# Patient Record
Sex: Male | Born: 1970
Health system: Southern US, Community
[De-identification: ages and names within clinical notes are randomized; demographics above are authoritative.]

## PROBLEM LIST (undated history)

## (undated) ENCOUNTER — Emergency Department (HOSPITAL_COMMUNITY): Admission: EM | Payer: Managed Care, Other (non HMO) | Source: Home / Self Care

## (undated) ENCOUNTER — Emergency Department (HOSPITAL_COMMUNITY): Admission: EM | Payer: Self-pay | Source: Home / Self Care

## (undated) DIAGNOSIS — E119 Type 2 diabetes mellitus without complications: Secondary | ICD-10-CM

## (undated) DIAGNOSIS — I10 Essential (primary) hypertension: Secondary | ICD-10-CM

## (undated) DIAGNOSIS — G4730 Sleep apnea, unspecified: Secondary | ICD-10-CM

## (undated) DIAGNOSIS — Z8709 Personal history of other diseases of the respiratory system: Secondary | ICD-10-CM

## (undated) DIAGNOSIS — D696 Thrombocytopenia, unspecified: Secondary | ICD-10-CM

## (undated) DIAGNOSIS — H9190 Unspecified hearing loss, unspecified ear: Secondary | ICD-10-CM

## (undated) DIAGNOSIS — K219 Gastro-esophageal reflux disease without esophagitis: Secondary | ICD-10-CM

## (undated) DIAGNOSIS — E785 Hyperlipidemia, unspecified: Secondary | ICD-10-CM

## (undated) DIAGNOSIS — E538 Deficiency of other specified B group vitamins: Secondary | ICD-10-CM

## (undated) DIAGNOSIS — N182 Chronic kidney disease, stage 2 (mild): Secondary | ICD-10-CM

## (undated) HISTORY — PX: HEMORRHOID SURGERY: SHX153

## (undated) HISTORY — PX: NASAL SEPTUM SURGERY: SHX37

## (undated) HISTORY — PX: INFUSION PUMP IMPLANTATION: SHX1824

## (undated) HISTORY — DX: Gastro-esophageal reflux disease without esophagitis: K21.9

## (undated) HISTORY — DX: Type 2 diabetes mellitus without complications: E11.9

---

## 2002-05-18 ENCOUNTER — Encounter: Payer: Self-pay | Admitting: Urology

## 2002-05-18 ENCOUNTER — Ambulatory Visit (HOSPITAL_COMMUNITY): Admission: RE | Admit: 2002-05-18 | Discharge: 2002-05-18 | Payer: Self-pay | Admitting: Urology

## 2002-09-05 ENCOUNTER — Encounter: Payer: Self-pay | Admitting: Family Medicine

## 2002-09-05 ENCOUNTER — Ambulatory Visit (HOSPITAL_COMMUNITY): Admission: RE | Admit: 2002-09-05 | Discharge: 2002-09-05 | Payer: Self-pay | Admitting: Family Medicine

## 2002-09-18 ENCOUNTER — Emergency Department (HOSPITAL_COMMUNITY): Admission: EM | Admit: 2002-09-18 | Discharge: 2002-09-18 | Payer: Self-pay | Admitting: Internal Medicine

## 2004-03-01 ENCOUNTER — Ambulatory Visit (HOSPITAL_COMMUNITY): Admission: RE | Admit: 2004-03-01 | Discharge: 2004-03-01 | Payer: Self-pay | Admitting: Family Medicine

## 2004-03-18 ENCOUNTER — Inpatient Hospital Stay (HOSPITAL_COMMUNITY): Admission: EM | Admit: 2004-03-18 | Discharge: 2004-03-20 | Payer: Self-pay | Admitting: Emergency Medicine

## 2004-03-18 ENCOUNTER — Ambulatory Visit: Payer: Self-pay | Admitting: Internal Medicine

## 2004-03-24 ENCOUNTER — Inpatient Hospital Stay (HOSPITAL_COMMUNITY): Admission: EM | Admit: 2004-03-24 | Discharge: 2004-03-27 | Payer: Self-pay | Admitting: Emergency Medicine

## 2004-03-24 ENCOUNTER — Ambulatory Visit: Payer: Self-pay | Admitting: Internal Medicine

## 2004-03-27 ENCOUNTER — Emergency Department (HOSPITAL_COMMUNITY): Admission: EM | Admit: 2004-03-27 | Discharge: 2004-03-27 | Payer: Self-pay | Admitting: Emergency Medicine

## 2004-03-30 ENCOUNTER — Inpatient Hospital Stay (HOSPITAL_COMMUNITY): Admission: EM | Admit: 2004-03-30 | Discharge: 2004-04-03 | Payer: Self-pay | Admitting: Emergency Medicine

## 2006-12-09 ENCOUNTER — Ambulatory Visit (HOSPITAL_COMMUNITY): Admission: RE | Admit: 2006-12-09 | Discharge: 2006-12-09 | Payer: Self-pay | Admitting: Family Medicine

## 2007-05-27 ENCOUNTER — Observation Stay (HOSPITAL_COMMUNITY): Admission: EM | Admit: 2007-05-27 | Discharge: 2007-05-28 | Payer: Self-pay | Admitting: Emergency Medicine

## 2007-07-20 ENCOUNTER — Ambulatory Visit (HOSPITAL_COMMUNITY): Admission: RE | Admit: 2007-07-20 | Discharge: 2007-07-20 | Payer: Self-pay | Admitting: Family Medicine

## 2008-06-21 ENCOUNTER — Emergency Department (HOSPITAL_COMMUNITY): Admission: EM | Admit: 2008-06-21 | Discharge: 2008-06-21 | Payer: Self-pay | Admitting: Emergency Medicine

## 2010-08-13 LAB — DIFFERENTIAL
Basophils Absolute: 0.1 10*3/uL (ref 0.0–0.1)
Basophils Relative: 1 % (ref 0–1)
Eosinophils Absolute: 0.2 10*3/uL (ref 0.0–0.7)
Eosinophils Relative: 2 % (ref 0–5)
Lymphocytes Relative: 24 % (ref 12–46)
Lymphs Abs: 2.1 10*3/uL (ref 0.7–4.0)
Monocytes Absolute: 0.6 10*3/uL (ref 0.1–1.0)
Monocytes Relative: 7 % (ref 3–12)
Neutro Abs: 6.1 10*3/uL (ref 1.7–7.7)
Neutrophils Relative %: 67 % (ref 43–77)

## 2010-08-13 LAB — CBC
HCT: 45.7 % (ref 39.0–52.0)
Hemoglobin: 15.6 g/dL (ref 13.0–17.0)
MCHC: 34.1 g/dL (ref 30.0–36.0)
MCV: 89.6 fL (ref 78.0–100.0)
Platelets: 159 10*3/uL (ref 150–400)
RBC: 5.11 MIL/uL (ref 4.22–5.81)
RDW: 12.8 % (ref 11.5–15.5)
WBC: 9.1 10*3/uL (ref 4.0–10.5)

## 2010-08-13 LAB — LIPASE, BLOOD: Lipase: 26 U/L (ref 11–59)

## 2010-08-13 LAB — URINALYSIS, ROUTINE W REFLEX MICROSCOPIC
Glucose, UA: NEGATIVE mg/dL
Hgb urine dipstick: NEGATIVE
Ketones, ur: NEGATIVE mg/dL
Nitrite: NEGATIVE
Protein, ur: NEGATIVE mg/dL
Specific Gravity, Urine: >1.03 — ABNORMAL HIGH (ref 1.005–1.030)
Urobilinogen, UA: 0.2 mg/dL (ref 0.0–1.0)
pH: 6 (ref 5.0–8.0)

## 2010-08-13 LAB — COMPREHENSIVE METABOLIC PANEL
ALT: 36 U/L (ref 0–53)
AST: 28 U/L (ref 0–37)
Albumin: 4.1 g/dL (ref 3.5–5.2)
Alkaline Phosphatase: 57 U/L (ref 39–117)
BUN: 17 mg/dL (ref 6–23)
CO2: 29 mEq/L (ref 19–32)
Calcium: 9.3 mg/dL (ref 8.4–10.5)
Chloride: 107 mEq/L (ref 96–112)
Creatinine, Ser: 1.24 mg/dL (ref 0.4–1.5)
GFR calc Af Amer: >60 mL/min (ref >60)
GFR calc non Af Amer: >60 mL/min (ref >60)
Glucose, Bld: 56 mg/dL — ABNORMAL LOW (ref 70–99)
Potassium: 4.2 mEq/L (ref 3.5–5.1)
Sodium: 142 mEq/L (ref 135–145)
Total Bilirubin: 0.4 mg/dL (ref 0.3–1.2)
Total Protein: 6.4 g/dL (ref 6.0–8.3)

## 2010-08-13 LAB — GLUCOSE, CAPILLARY
Glucose-Capillary: 62 mg/dL — ABNORMAL LOW (ref 70–99)
Glucose-Capillary: 84 mg/dL (ref 70–99)

## 2010-09-10 NOTE — H&P (Signed)
NAMESIRIUS, WOODFORD NO.:  [id number]   MEDICAL RECORD NO.:  [id number]          PATIENT TYPE:  EMS   LOCATION:  ED                            FACILITY:  [facility]   PHYSICIAN:  Osvaldo Shipper, MD     DATE OF BIRTH:  1970/05/21   DATE OF ADMISSION:  05/27/2007  DATE OF DISCHARGE:  LH                              HISTORY & PHYSICAL   PRIMARY CARE PHYSICIAN:  Dr. Nobie Putnam from [facility].   ADMITTING DIAGNOSES:  1. Viral gastroenteritis.  2. Insulin-dependent diabetes.  3. Dyslipidemia.   CHIEF COMPLAINT:  Nausea, vomiting, diarrhea since this morning.   HISTORY OF PRESENT ILLNESS:  The patient is a 40 year old Caucasian male  who has insulin-dependent diabetes who woke up around 4:30 this morning  with vomiting.  The emesis lasted up until about 7:30, 7:45, and has  stopped since then.  After the episodes of vomiting this morning he also  started having diarrhea.  He has been having watery yellowish stool,  greenish sometimes but no blood.  He has had numerous episodes since  this morning.  Denies any abdominal pain.  He states his mother was sick  with similar symptoms the last few days, and he had been exposed to her.  He denied taking any antibiotics recently.  Denied any other exposure in  the form of eating out in a restaurant or such activities in the past  few days.   MEDICATIONS AT HOME:  1. Actos 45 mg once a day.  2. Protonix 40 mg once a day.  3. Simvastatin 10 mg once a day.  4. Lisinopril 5 mg once a day.  5. Insulin NovoLog pump.   ALLERGIES:  CODEINE.   PAST MEDICAL HISTORY:  Insulin-dependent diabetes for four years.  He  has been on the pump for the last three years.  He also has  dyslipidemia, hypertension.  He has never had any other surgeries in the  past.   SOCIAL HISTORY:  He lives in [place] with his wife.  He works in a  [employer].  Smokes occasionally mostly on the weekends when he has  about a pack per  day.  He does not drink any alcohol or illicit drug  use.   FAMILY HISTORY:  Unremarkable as per the patient.   REVIEW OF SYSTEMS:  GENERAL:  Positive for weakness.  HEENT:  Unremarkable.  CARDIOVASCULAR:  Unremarkable.  RESPIRATORY:  Unremarkable.  GI:  As in HPI.  GU:  Unremarkable.  UROLOGICAL:  Unremarkable.  PSYCHIATRIC:  Unremarkable.  NEUROLOGICAL:  Unremarkable.  Other systems unremarkable.   PHYSICAL EXAMINATION:  VITAL SIGNS:  Temperature 100.1, blood pressure  102/47, heart rate 130's to 140's, sinus tachycardia, respiratory rate  20, saturation 93% on room air.  Orthostatics have been unable to be  done at this time because patient has a lot of symptoms when he sits up.  GENERAL:  Slightly overweight individual in no distress.  Appears to be  quite weak.  HEENT:  There is no pallor, no icterus, oral mucosa is dry, no  oral  lesions are noted.  NECK:  Soft and supple, no thyromegaly is appreciated.  LUNGS:  Clear to auscultation bilaterally.  CARDIOVASCULAR:  S1, S2, tachycardic, regular, no murmurs appreciated.  ABDOMEN:  Soft, nontender, nondistended.  Bowel sounds are present.  No  mass or organomegaly is appreciated.  EXTREMITIES:  Show no edema.  Peripheral pulses are palpable.  NEUROLOGIC:  The patient is alert, oriented x3.  No focal neurological  deficits are present.   LABORATORY DATA:  White count 16,300 with 91% neutrophils, hemoglobin  17.6, MCV 88, platelet count 166.  BMET shows BUN 29, creatinine 1.4,  glucose 76, bicarb 23.  Blood acetone was negative.  UA did not show any  evidence of infection.  He did have acute abdominal series with  prominent small bowel loops in mid abdomen, cannot exclude early small  bowel obstruction.   ASSESSMENT:  This is a 40 year old Caucasian male with past medical  history of diabetes insulin-dependent who presents with onset of nausea,  vomiting, and diarrhea since this morning.  Symptoms sound typical for  viral  gastroenteritis.  He does not have any risk factors for  Clostridium difficile.  No evidence at this point to suggest food  poisoning.  He has leukocytosis and his abdominal series showing  nonspecific findings.   PLAN:  1. Likely viral gastroenteritis.  We will treat this symptomatically      with Imodium and anti-emetics.  If his vomiting persists will      consider getting a CAT scan.  If not we will advance his diet      slowly.  He will get Tylenol for fever.  Since he does have      leukocytosis I will put him on Cipro which I think can be      discontinued if the patient improves.  2. Insulin-dependent diabetes.  I think it is okay at this time to let      him continue with his insulin pump.  As long as he is able to      tolerate p.o. I think this should be reasonable.  If he continues      to have emesis we will add some D5 to his IV fluid, and if despite      this his blood sugars are not regulated I think we will just switch      him over to IV insulin drip.  3. We will hold off on his lisinopril for now as he is orthostatic      clearly based on symptoms.  We will check his LFT's tomorrow.  We      will continue to monitor his blood sugars closely.  He is      tachycardic because of dehydration, and will continue to monitor      that as well.   Further management decisions will depend on patient's response to  treatment.      Osvaldo Shipper, MD  Electronically Signed    GK/MEDQ  D:  05/27/2007  T:  05/27/2007  Job:  [id number]   cc:   Patrica Duel, M.D.  Fax: [phone]

## 2010-09-10 NOTE — Discharge Summary (Signed)
[name], [name]                  ACCOUNT NO.:  [id number]   MEDICAL RECORD NO.:  [id number]          PATIENT TYPE:  OBV   LOCATION:  A217                          FACILITY:  [facility]   PHYSICIAN:  Osvaldo Shipper, MD     DATE OF BIRTH:  July 24, 1970   DATE OF ADMISSION:  05/27/2007  DATE OF DISCHARGE:  01/30/2009LH                               DISCHARGE SUMMARY   Please see the H&P dictated yesterday for details regarding the  patient's presenting illness.   DISCHARGE DIAGNOSES:  1. Acute viral gastroenteritis, improved.  2. Insulin-dependent diabetes, stable.  3. Hypertension  4. Dyslipidemia, stable.   BRIEF HOSPITAL COURSE:  Briefly, this is a 40 year old Caucasian male  who has insulin-dependent diabetes, who presented with nausea, vomiting,  diarrhea, for about 8-hour duration.  The patient was exposed to his  mother, who had similar symptoms in this week.  As the patient was seen  in the ED, he was found to be profoundly dehydrated and tachycardic at  130s, 140s, and his blood pressure was running a little bit low.  It was  clinically orthostatic, though we never could obtain blood pressures to  give objective data.  He had mild pre-renal azotemia, as well.  Surprisingly, he was not in DKA.  The patient was admitted to the  hospital.  He was continued on his insulin pump.  He was given a lot of  fluids, and he has improved.  His heart rate has come down to 90's.  His  symptoms have improved.  He has not had any nausea, vomiting, since  yesterday morning.  Continues to have diarrhea, but it seems to have  slowed down as well.  His last episode of diarrhea was 7:00 a.m. this  morning.  The patient is very keen on going home.  I think it may be  reasonable to let him go home if he tolerates his lunch and is able to  ambulate a little bit.  I have told him to take over-the-counter  Imodium, if he continues to have diarrhea, otherwise to follow up with  his PMD next week.   Once  the patient tolerates lunch, and if he ambulates with assistance, I  think he can go home today, as he is very keen on doing so.   DISCHARGE MEDICATIONS:  1. Imodium 2 mg as needed for diarrhea every 4 hours.  2. Actos 45 mg daily.  3. Protonix 40 mg daily.  4. Lisinopril 5 mg daily.  5. Zocor 10 mg q.p.m.  6. Insulin pump, NovoLog as before.   FOLLOWUP:  With Dr. Nobie Putnam early next week.   IMAGING STUDIES:  He had an acute abdominal series which was non-  specific.  There was very low suspicion for SBO, based on clinical  examination, so we did not proceed with a CAT scan.   The patient has been told that if his symptoms recur, he need to seek  attention immediately.  The patient will be discharged, because he is  very keen on going home and appears to be quite stable at  this time.   Total time of discharge 35 minutes.      Osvaldo Shipper, MD  Electronically Signed     GK/MEDQ  D:  05/28/2007  T:  05/28/2007  Job:  [id number]   cc:   Patrica Duel, M.D.  Fax: [phone]

## 2010-09-13 NOTE — Discharge Summary (Signed)
[name], [name]                  ACCOUNT NO.:  [id number]   MEDICAL RECORD NO.:  [id number]          PATIENT TYPE:  INP   LOCATION:  A227                          FACILITY:  [facility]   PHYSICIAN:  Patrica Duel, M.D.    DATE OF BIRTH:  06-Dec-1970   DATE OF ADMISSION:  03/18/2004  DATE OF DISCHARGE:  11/23/2005LH                                 DISCHARGE SUMMARY   DISCHARGE DIAGNOSES:  1.  New onset diabetes mellitus.  2.  Dysphagia, status post esophagogastroduodenoscopy with minor esophageal      stricture status post dilation.   For details regarding this admission, please refer to admitting notes.   Briefly, this 40 year old male with a history of gastroesophageal reflux  disease presented to the office with a several day history of nausea and  loose stools.  He also reported an approximate 10 pound weight loss, extreme  thirst as well as cotton mouth.  His Accu-Chek reading was high.  He was  sent to the emergency department for evaluation.   There he was found to have a sugar approximately of 800 with no  ketoacidosis.  His potassium was 3.7.  High volume saline drip and IV  insulin were instituted.  He was transferred to the floor for further care.   COURSE IN THE HOSPITAL:  The patient did well in the hospital with prompt  normalization of his blood sugar.  His insulin drip was discontinued after  approximately eight hours of therapy.  He was begun on Glucovance 2.5/250  and maintained on sliding scale.  His sugars have remained improved in the  200 to 300 range.  He has required minimal supplemental insulin and he has  undergone diabetic teaching.   The patient has chronic dysphagia, which is somewhat atypical.  GI was  consulted and an upper endoscopy was performed.  There was no explanation  for the pharyngeal component of his dysphagia, though he did have a small  esophageal stricture.  H. pylori serology has been obtained and is pending.   Currently the patient is  eating well.  Blood sugar was 250 this morning.  He  is stable for discharge at this time.   PLAN:  1.  Glucovance 2.5/500 b.i.d.  2.  Protonix 40 mg q.d.  3.  He will follow his blood sugars frequently and call if he has any      problems.  4.  He will be evaluated in the office in one week.     Mark  MC/MEDQ  D:  03/20/2004  T:  03/20/2004  Job:  [id number]

## 2010-09-13 NOTE — Op Note (Signed)
[name], [name]                  ACCOUNT NO.:  [id number]   MEDICAL RECORD NO.:  [id number]          PATIENT TYPE:  INP   LOCATION:  A227                          FACILITY:  [facility]   PHYSICIAN:  Lionel December, M.D.    DATE OF BIRTH:  Sep 09, 1970   DATE OF PROCEDURE:  03/19/2004  DATE OF DISCHARGE:                                 OPERATIVE REPORT   PROCEDURE:  Esophagogastroduodenoscopy with esophageal dilation.   INDICATIONS:  [name] is a 40 year old Caucasian male who was admitted with a  15-pound weight loss, worsening nausea and vomiting, which he has had  chronically, and he was found to be diabetic with a glucose of 782.  He also  has intolerance to a variety of foods.  The only foods that he can eat and  keep down are hot dogs, [place] fries, Fruit Loops, ice cream, and some other  sweets.  He says he usually does all right with bread but cannot eat buns  with hamburgers, etc.  He also has intermittent solid food dysphagia.  He is  undergoing diagnostic and therapeutic EGD.  The procedure and risks were  reviewed with the patient, and informed consent was obtained.   PREOP MEDICATIONS:  Cetacaine spray for pharyngeal topical anesthesia,  Demerol 50 mg IV, Versed 8 mg IV in divided dose.   FINDINGS:  Procedure performed in the endoscopy suite.  Patient's vital  signs and O2 sat were monitored during the procedure and remained stable.  Patient was placed in the left lateral decubitus position.  The Olympus  scope was passed through the oropharynx without any difficulty, into the  esophagus.   ESOPHAGUS:  Mucosa of the esophagus normal except distally there were three  linear plaques of whitish coating.  Endoscopic appearance was not typical of  Candida.  Brushing was taking for KOH prep on the way out.  There was  incomplete noncritical ring at the GE junction.  The GE junction was at 38  cm from the incisors.  There was a 2-3 cm sliding hiatal hernia.  The mucosa  of the cardia  part of the stomach just below the gastric mucosa below the Z  line was erythematous.   STOMACH:  It was empty and distended very well with insufflation.  Folds of  the proximal stomach were normal.  Examination of the mucosa revealed few  petechiae.  The gastric antrum with erythema and granularity.  No ulcer  crater was seen.  The pyloric channel was patent.  The angularis, fundus,  and cardia were examined by retroflexed of the endoscope and were normal.   DUODENUM:  The bulbar mucosa revealed few erosions, but there was no ulcer  crater.  The scope was passed to the second and third part of the duodenum.  Folds were not very prominent but did not appear to be necessarily  __________ but felt to be within normal limits.  Biopsy was taken from post  bulbar mucosa for routine histology.  The endoscope was withdrawn.   The esophagus was dilated by passing a 56 [place] Maloney dilator.  The  endoscope was passed again post ED, and there was mucosal disruption at the  GE junction.  Brushing was taken for KOH prep from this esophageal mucosa  with whitish plaque, and the endoscope was withdrawn.  The patient tolerated  the procedure well.   FINAL DIAGNOSIS:  1.  Incomplete ring at the gastroesophageal junction with small sliding      hiatal hernia, free of margins of reflux esophagitis, limited to      gastroesophageal junction.  Distal esophageal mucosa revealed patchy,      whitish plaques which were brushed for KOH prep.  The esophagus was      dilated by passing a 56 [place] Maloney dilator, disrupting the ring.  2.  Gastric duodenitis.  3.  Duodenal biopsy taken from post bulbar duodenum, looking for mucosal      disease to make sure we are not dealing with celiac disease of atypical      presentation, given that he is diabetic.   RECOMMENDATIONS:  1.  Antireflux measures.  Continue PPI.  2.  Helicobacter pylori serology will be checked.     [name]   NR/MEDQ  D:  03/19/2004  T:   03/19/2004  Job:  [id number]   cc:   Patrica Duel, M.D.  [address]  [place]  [place] [address]  Fax: [phone]

## 2010-09-13 NOTE — Discharge Summary (Signed)
NAMEKENNIE, [name]                  ACCOUNT NO.:  [id number]   MEDICAL RECORD NO.:  [id number]          PATIENT TYPE:  INP   LOCATION:  A227                          FACILITY:  [facility]   PHYSICIAN:  Madelin Rear. Sherwood Gambler, MD  DATE OF BIRTH:  1970/05/15   DATE OF ADMISSION:  DATE OF DISCHARGE:  11/30/2005LH                                 DISCHARGE SUMMARY   DISCHARGE DIAGNOSES:  1.  Diabetes mellitus type 2, out of control.  2.  Dehydration.  3.  Probable eating disorder secondary to functional illness.  4.  Urinary tract infection.   DISCHARGE MEDICATIONS:  1.  Actos 30 mg p.o. daily.  2.  Cipro 500 mg p.o. b.i.d. x7 days.   SUMMARY:  The patient presents with peculiar dietary habits that are by  description life-long, consisting of French-fries, potato chips, and peanut  butter.  This is pretty much sole dietary intake for years according to  family, is confirmed.  He was brought in because of vomiting with attempts  at eating and swallowing pills.  Question medication intolerance.  He was  initially placed on glipizide, but apparently did not tolerate that well,  and had increasing blood sugars prompting admission.  He was evaluated by GI  who concurs with a functional illness and not an organic pathology  contributing to the vomiting.  He is able to handle [place] fries and peanut  butter without any problem, but any other dietary advised foods are rejected  causing nausea.  No emesis occurred in the hospital.  He was discharged  after reasonable blood sugar control in the low 200's fasting was noted.  He  required initial sliding scale insulin coverage; however, Actos at low dose  controlled his sugars well enough to obviate the use of insulin.  He will be  increased to 30 mg of Actos daily, followed up in one week.  He will follow  his fasting sugars.  We had a long conversation with the family as well as  the patient about working on his diet.  We will also schedule a psychiatric  followup to determine if there is any assistance they can render to change  his eating pattern.     Lawr   LJF/MEDQ  D:  03/27/2004  T:  03/27/2004  Job:  [id number]

## 2010-09-13 NOTE — H&P (Signed)
[name], [name]                  ACCOUNT NO.:  [id number]   MEDICAL RECORD NO.:  [id number]          PATIENT TYPE:  INP   LOCATION:  A225                          FACILITY:  [facility]   PHYSICIAN:  Corrie Mckusick, M.D.  DATE OF BIRTH:  1970-09-08   DATE OF ADMISSION:  03/30/2004  DATE OF DISCHARGE:  LH                                HISTORY & PHYSICAL   CHIEF COMPLAINT:  Vomiting, abdominal pain, uncontrolled diabetes.   HISTORY OF PRESENT ILLNESS:  A 40 year old gentleman with a recent diagnosis  of noninsulin dependent diabetes who was recently admitted x2 for nausea,  vomiting and uncontrolled blood sugars.  Please see the two prior H&P's from  the last two weeks for all details as well as discharge summaries from those  times.   He has gone home and not been able to take his medications including his  Cipro which was given on the last visit for UTI, and has had uncontrolled  blood sugar as well as nausea and vomiting. Again, please see all of the  prior notes from the recent admissions, as well as the consultations from GI  for all details.   PAST MEDICAL HISTORY:  Diabetes.   SOCIAL HISTORY:  Cigarette smoker, no alcohol, no drug use.   FAMILY HISTORY:  Diabetes.   MEDICATIONS ON ADMISSION:  1.  Glucotrol XL 10 mg daily.  2.  Actos 15 mg daily.   ALLERGIES:  CODEINE.   PHYSICAL EXAMINATION:  GENERAL:  A pleasant gentleman when I saw him,  joking, in no acute distress, seeing when he could go home.  VITAL SIGNS:  Temperature 98.1, blood pressure 124/70, pulse 139,  respirations 20.  HEENT:  Nasopharynx clear with moist mucous membranes.  NECK:  Supple, no lymphadenopathy.  CHEST:  Clear to auscultation bilaterally.  CARDIOVASCULAR:  Regular rate and rhythm, normal S1 and S2, no S3, murmurs,  gallops or rubs.  ABDOMEN:  Soft, nontender, nondistended.  EXTREMITIES:  No edema.   LABORATORY DATA:  Capillary blood sugar was 393 in the emergency room.  Please see flow  sheet for details.   ASSESSMENT/PLAN:  A 40 year old for readmission due to uncontrolled  diabetes, nausea and vomiting.   PLAN:  1.  Admit for aggressive fluid hydration as seen in the orders.  2.  Start sliding scale insulin, hold on insulin drip for now.  3.  Start Lantus 10 units at night.  4.  Hold n.p.o. for now.  5.  Watch potassium as I suspect it will drop and go ahead and start      potassium supplementation.  6.  Cover with Cipro 400 IV b.i.d. as he had UTI prior to discharge on the      last admission.  7.  Consult GI.  8.  Consult endocrinology.  9.  We will continue to follow up closely and keep him monitored on 2-A      telemetry.     Clemetine Marker  D:  03/31/2004  T:  03/31/2004  Job:  [id number]

## 2010-09-13 NOTE — Consult Note (Signed)
NAMEWASHINGTON, [name]                  ACCOUNT NO.:  [id number]   MEDICAL RECORD NO.:  [id number]          PATIENT TYPE:  INP   LOCATION:  A227                          FACILITY:  [facility]   PHYSICIAN:  Lionel December, M.D.    DATE OF BIRTH:  24-Jul-1970   DATE OF CONSULTATION:  DATE OF DISCHARGE:                                   CONSULTATION   REQUESTING PHYSICIAN:  Dr. Patrica Duel.   REASON FOR CONSULTATION:  Nausea, vomiting _____________, gastroesophageal  reflux disease.   HISTORY OF PRESENT ILLNESS:  ___________ He presented to his primary care  physician yesterday with complaint of 15-pound weight loss, increased  vomiting, a few days of diarrhea.  He was found to have a markedly-elevated  blood glucose level in the office and was admitted to the hospital for  further management.  Upon admission, his glucose was 782.  His potassium is  3.7, sodium 133.  BUN 16, creatinine 1.7.  Total bilirubin 1.7.  Alkaline  phosphatase 84, AST 14, ALT 19, albumin 4.0.  Calcium 9.1.  WBC 13, albumin  300.  Acute abdominal film was negative.  His glucose levels have improved.  His glucose this morning was 268.  Given his atypical eating habits and  complaints of dysphagia and postprandial vomiting, we were asked to see the  patient.  According to the patient and his wife, he only eats hotdog without  the bun, [place] fries, Fruit Loops, ice-cream, mild and other sweets.  He  says he has been like this all of his life.  He says he has been to multiple  doctors, and one doctor told him that he needs to see a psychiatrist.  As  long as he is eating the problems listed above, he has no problems. If he  tries to eat a food out of the ordinary for him, he vomits almost  immediately.  If feels like it is getting caught in his upper esophagus.  He  denies any nausea.  Lately, he has been vomiting more frequently, but this  has been felt to be due to his poorly-controlled glucose.  He also gives a 7-  year  history of gastroesophageal reflux disease.  He takes Tums and Rolaids  p.r.n.  He has been on PPI therapy in the past.  Currently, he is having  regular bowel movements.  He denies any diarrhea.  He denies melena or  rectal bleeding or abdominal pain.  He has lost about 15 pounds the last few  weeks.   CURRENT MEDICATIONS:  1.  Tums.  2.  Rolaids p.r.n.  3.  He took Aleve recently for what he felt was the flu, but does not take      this on a regular basis.   ALLERGIES:  CODEINE causes nausea.   PAST MEDICAL HISTORY:  1.  Gastroesophageal reflux disease for 7-8 years.  2.  New onset diabetes mellitus.  3.  Congential left ear deficits.  4.  He reports having his esophagus looked at three years ago via x-ray.   PAST SURGICAL HISTORY:  Hemorrhoidectomy.  FAMILY HISTORY:  Father has hyperlipidemia and hypertension.  No family  history of chronic GI illnesses, colorectal cancer or diabetes mellitus.   SOCIAL HISTORY:  He is married and has no children.  He used to be a heavy  smoker, smoking 2-3 packs daily, but a year ago, he cut back.  Now he only  smokes 3-4 cigarettes on the weekends.  He consumed about a six-pack of beer  on the weekends.   REVIEW OF SYSTEMS:  See HPI for GI.  In general, CARDIOPULMONARY:  Denies  any chest pain or shortness of breath.  GU:  Denies any dysuria, hematuria.   PHYSICAL EXAMINATION:  VITAL SIGNS:  Temperature 97, pulse 97, respirations  20, blood pressure 120/70.  GENERAL:  A pleasant, somewhat irritable Caucasian male, in no acute  distress.  SKIN:  Warm and dry, no jaundice.  HEENT:  Pupils equal, round and reactive to light.  Conjunctivae are pink.  Sclerae are nonicteric.  Oropharyngeal mucosa are moist and pink, no  lesions, erythema or exudate.  No lymphadenopathy or thyromegaly.  CHEST:  Lungs are clear to auscultation.  CARDIAC:  Exam reveals regular rate and rhythm, normal S1 and S2.  No  murmurs, rubs or gallops.  ABDOMEN:   Positive bowel sounds, soft, nondistended.  A very small pea-sized  umbilical hernia, nontender.  No organomegaly or masses.  No rebound,  tenderness or guarding.  EXTREMITIES:  No edema.   LABORATORY DATA:  As mentioned in HPI.  In addition, hemoglobin is 68.3,  hematocrit 46.2, platelets 202,000.  Sodium 133, total bilirubin 1.7,  alkaline phosphatase 84, AST 14, ALT 19, albumin 4.0.  Calcium 9.1.   IMPRESSION:  The patient is a 40 year old gentleman who was admitted with  new onset diabetes mellitus.  He had atypical eating habits. He only eats  very specific food preferences, and if he tries to anything outside of this,  he vomits almost immediately.  His symptoms are very atypical for  periesophageal dysphagia.  He does have chronic gastroesophageal reflux  disease and is at increased risk for Barrett's esophagus and other  complications.  I doubt his chronic symptoms are secondary to gastroparesis.   RECOMMENDATIONS:  1.  We will discuss further with Dr. Lionel December.  Consider inpatient EGD.  2.  Advise chronic PPI therapy for gastroesophageal reflux symptoms.  3.  Recommend dietary consultation.   I would like to thank Dr. Patrica Duel for allowing [place] to take part in the  care of this patient.     Lesl   LL/MEDQ  D:  03/19/2004  T:  03/19/2004  Job:  [id number]

## 2010-09-13 NOTE — Consult Note (Signed)
[name], [name]                  ACCOUNT NO.:  [id number]   MEDICAL RECORD NO.:  [id number]          PATIENT TYPE:  INP   LOCATION:  A227                          FACILITY:  [facility]   PHYSICIAN:  Sherrie Mustache, M.D.    DATE OF BIRTH:  1971-04-21   DATE OF CONSULTATION:  03/26/2004  DATE OF DISCHARGE:                                   CONSULTATION   REFERRING PHYSICIAN:  Madelin Rear. Sherwood Gambler, MD   INDICATION FOR THE CONSULT:  To help diabetes.   The patient is a 40 year old male who has been diagnosed to have diabetes  mellitus type 2 about two weeks ago after having blood work at Dr.  Geanie Logan office.  The patient had to be admitted because of having a  glucose level of 800 but no acute acidosis.  He was in the hospital for two  days and was released home on an oral agent, Glucophage, for controlling of  his sugar.  The other mediation that apparently he was taking was Protonix.  The patient has told me that he was doing fine until three days, when he  started to have again nausea and vomiting.  The patient had to come back to  the ER and was found to have hyponatremia, hypokalemia, and the sugar value  was 304.  The patient is admitted for management and further workup.   PAST MEDICAL HISTORY:  History of esophageal stricture.   SOCIAL HISTORY:  Positive for smoking, negative for ethanol abuse, negative  for drug abuse.  The patient is married.   ALLERGIES:  CODEINE.   FAMILY HISTORY:  Grandmother with diabetes, father has hyperlipidemia, and  mother has hyperlipidemia.   PHYSICAL EXAMINATION:  GENERAL:  The patient is well-developed and well-  nourished, does not appear to be in acute distress.  He is not jaundiced.  VITAL SIGNS:  His temperature is 97.5, heart rate 80, respiratory rate 18,  blood pressure 100/60.  HEENT:  Pupils equal, round, and reactive to light, EOMI, anicteric.  Throat  negative and atraumatic.  Ears and nose are unremarkable.  NECK:  Supple, negative  JVD, negative thyromegaly, negative lymphadenopathy,  negative bruits.  CHEST:  Lungs are clear, no wheezing and no rhonchi.  CARDIAC:  RRR, no murmurs, no gallops.  ABDOMEN:  Soft, no tenderness, bowel sounds present.  EXTREMITIES:  Negative for peripheral edema, clubbing or cyanosis.  Reflexes  symmetric and normal.  Peripheral pulses are present.   ASSESSMENT AND PLAN:  The patient has a recently diagnosed diabetes;  however, apparently he cannot tolerate Glucophage.  Since he has been  readmitted to the hospital, he has been taking Actos 15 mg daily, and his  sugar is running around 190-220.  I would keep him on 1800 calorie ADA and  also I explained to him the kind of diet that he should have when he goes  home, which should be mainly proteins without fat and vegetables and low in  carbohydrate.  I explained to him also how important it is that he has  physical activity, especially walking after meals.  He was advised also to  eat more than three times but small amounts at a time.  I would increase his  Actos to 30 mg, if needed to 45 mg, but watch diet, to try to control his  sugar.  However, if his blood sugar values are persistently high, then I  would consider _________ or longstanding insulin in addition to Actos.  I  would check today his lipid panel and also his hemoglobin A1C.      JF/MEDQ  D:  03/26/2004  T:  03/26/2004  Job:  [id number]

## 2010-09-13 NOTE — Consult Note (Signed)
[name], [name]                  ACCOUNT NO.:  [id number]   MEDICAL RECORD NO.:  [id number]          PATIENT TYPE:  INP   LOCATION:  A225                          FACILITY:  [facility]   PHYSICIAN:  Ihor Austin. Pulliam, M.D.DATE OF BIRTH:  July 06, 1970   DATE OF CONSULTATION:  03/30/2004  DATE OF DISCHARGE:                                   CONSULTATION   REASON FOR CONSULTATION:  Recurrent vomiting and dehydration.   HISTORY OF PRESENT ILLNESS:  I was asked to see this 40 year old white male  in consultation by Dr. Assunta Found who had admitted him via the emergency  room today.   [name] has had multiple recent admissions for poorly controlled diabetes  which generally leads to marked dehydration and eventually nausea and  vomiting.  He says he was tolerating food until last night when he started  having recurrent problems with nausea and vomiting.  He has not been able to  keep down any p.o. and it was felt that admission to the hospital was  necessary in order to appropriately rehydrated with IV's and also give him  IV medications for current symptoms.   It should be noted that he did have an upper GI endoscopy with esophageal  dilatation on March 19, 2004, by Dr. Lionel December.  Dr. Karilyn Cota felt that  there was an incomplete ring of the gastroesophageal junction with a small  sliding hiatal hernia.  There was no significant esophagitis.  Some whitish  plaques were noted.  Duodenitis was also found and it was advised that he  should be on anti-reflux measures as well as oral proton pump inhibitor  therapy.  He was dilated with a 56 [place] Maloney dilator.   PAST MEDICAL HISTORY:  1.  Chronic diabetes mellitus.  2.  Remote hemorrhoidectomy.   SOCIAL HISTORY:  He admits to being a cigarette smoker, but does not abuse  tobacco or alcohol.   FAMILY HISTORY:  Father has hyperlipidemia.  His mother has hyperlipidemia  as well as hypertension.  There is no one in the family with  peptic ulcer  disease, biliary tract disease, or GI tract, or pancreatic or biliary  malignancy.   REVIEW OF SYSTEMS:  Really could not obtain because the patient was somewhat  sedated as well as dry mouth and dehydrated, really did not feel up to  talking.   PHYSICAL EXAMINATION:  GENERAL:  A somewhat sleepy white male who seems in  no acute distress.  HEENT:  Normocephalic, atraumatic.  PERRL.  EOMI.  Hypopharynx shows a very  dry tongue and dry mucous membranes in the mouth.  NECK:  No deformity.  No palpable mass or tender nodes.  HEART:  Tachy, regular rate and rhythm, no murmur.  The precordium is  silent.  LUNGS:  Overall clear with fairly good effort.  ABDOMEN:  Obese, but soft.  Nondistended.  Bowel sounds active diffusely.  No tenderness palpable.  GENITORECTAL:  Not examined.  EXTREMITIES:  No active joint disease.  No peripheral edema.  No clubbing.  NEUROLOGIC:  Cranial nerves are intact to  confrontation.  No evidence of  gross sensory or motor deficits.  I did not have him demonstrate his gait.   LABORATORY DATA:  CBC with differential white blood cell count 18,100, with  86% neutrophils and 9% lymphocytes.  Hemoglobin 15, hematocrit 44.6%.  CMP  remarkable for sodium 133, bicarbonate 7, glucose 456.  Hepatic function  profile was within normal limits.  Serum amylase normal at 48 (27 to 131).  Serum lipase normal at 28 (normal 22 to 51).   IMPRESSION:  1.  Recurrent nausea and vomiting with dehydration.  It is certainly      possible that he has underlying diabetic gastroparesis.  At the same      time, his marked dehydration and hyperglycemia could lead to a cycle of      worsening pain and emesis as well.  For now, I think it would be      reasonable to treat him supportedly as if he has impaired gastric      emptying.  2.  Recent endoscopy with dilatation of distal esophageal ring.  3.  Endoscopic diagnosis of duodenitis on recent endoscopy.  4.  Poorly  controlled diabetes mellitus.  Compliance with medications sounds      to be a very significant issue.   PLAN:  1.  I agree with current intravenous orders for vigorous hydration.  2.  We will begin intravenous Protonix 40 mg intravenously daily.  3.  For its promotility effect, erythromycin 100 mg intravenously q.6h.  May      also need to add intravenous Reglan.  4.  We will prescribed Phenergan on a p.r.n. basis for its anti-emetic      effect.  5.  Do not think repeat endoscopy is indicated.  Once symptoms cool down, he      may be a good candidate for a nuclear medicine gastric emptying study to      document baseline.  6.  Consideration for evaluation of his biliary tree if not recently done to      include ultrasound of the abdomen as well as a possible nuclear medicine      gallbladder emptying study.   Thank you for this consultation.      TJP/MEDQ  D:  03/30/2004  T:  03/30/2004  Job:  [id number]   cc:   Lionel December, M.D.  [id number]  [place]  [place] [address]  Fax: [phone]

## 2010-09-13 NOTE — H&P (Signed)
[name], [name]                  ACCOUNT NO.:  [id number]   MEDICAL RECORD NO.:  [id number]          PATIENT TYPE:  INP   LOCATION:  A227                          FACILITY:  [facility]   PHYSICIAN:  Madelin Rear. Sherwood Gambler, MD  DATE OF BIRTH:  12/31/1970   DATE OF ADMISSION:  03/24/2004  DATE OF DISCHARGE:  LH                                HISTORY & PHYSICAL   CHIEF COMPLAINT:  Vomiting and abdominal pain.   HISTORY OF PRESENT ILLNESS:  The patient was recently diagnosed with new-  onset non-insulin-dependent diabetes mellitus, although he did have a mild  acidosis.  He was admitted and started on a regimen of oral hypoglycemics.  He was discharged on March 20, 2004.  He did relatively okay for the  ensuing 4 days, however, developed this intractable vomiting, stating that  every time he ate something including the medication used to control his  diabetes, that he would throw up.  His sugars began to rise due to non-  absorption of medication and I was called for this problem.  He had no  fever, vigors or chills.  He was sent to the emergency department for  evaluation.  On arrival there, he was found to be without active vomiting or  acute abdomen; he was noted to be hyponatremic and hypokalemic.  He also had  a modest anion gap acidosis.  His bilirubin was elevated at 2.3, although it  has been 1.7 in the past with a mild elevation of his indirect at 1.1.  Amylase and lipase were normal.  Liver function tests revealed modest  hypoalbuminemia, but otherwise unrevealing.  No radiographs were obtained at  the time of his ER evaluation.   PAST MEDICAL HISTORY:  He was recently endoscoped and found to have minor  esophageal stricture which was dilated on his last admission.   SOCIAL HISTORY:  Positive cigarette smoking, no alcohol or other drug use.   FAMILY HISTORY:  He has a grandmother with diabetes, father with  hyperlipidemia, as well as his mother with hyperlipidemia and  hypertension.  One sister is alive and well with no noted health problems.   PHYSICAL EXAMINATION:  GENERAL:  He is awake, alert and cooperative.  HEAD AND NECK:  No JVD or adenopathy.  Neck is supple.  CHEST:  Clear.  CARDIAC EXAM:  Regular rhythm without murmur, gallop or rub.  ABDOMEN:  Abdomen is soft.  No organomegaly or masses.  No guarding or  rebound tenderness.  EXTREMITIES:  Extremities without clubbing, cyanosis or edema.  NEUROLOGIC:  Examination is nonfocal.   LABORATORY AND ACCESSORY CLINICAL DATA:  Lab studies revealed an arterial  blood gas with a pH of 7.307 and a PCO2 of 28.9.  His CBC revealed normal  H&H and white count with a left shift.  Platelet count was normal.  Electrolytes:  Sodium 131, potassium 3.2, bicarbonate 15, blood sugar 304,  BUN and creatinine 17 and 1.4.  Liver function tests are as outlined under  the history of present illness.   Radiographs were not obtained.   Urinalysis was  remarkable for positive ketones and a pH of 5.0 with  glucosuria greater than 1000 noted.  Of note, he had no proteinuria.   IMPRESSION:  1.  Hyperglycemia with mild acidosis.  He will be admitted for intravenous      hydration, correction of his electrolytes, insulin sliding scale.  2.  Abdominal pain with recurrent vomiting.  The patient may be suffering      from acalculous cholecystitis.  Gallbladder ultrasound as well as      nuclear medicine studies to assess this will be performed while in      house.  Another possibility is he has a biliary dyskinesia.  If all      studies are negative, we plan to attain this as possible cause of      recurrent vomiting without localizing signs.  3.  Smoking.  Advised to discontinue this habit.     Lawr   LJF/MEDQ  D:  03/25/2004  T:  03/25/2004  Job:  [id number]

## 2010-09-13 NOTE — Discharge Summary (Signed)
[name], [name].:  [id number]   MEDICAL RECORD Vazquez.:  [id number]          PATIENT TYPE:  INP   LOCATION:  A225                          FACILITY:  [facility]   PHYSICIAN:  Corrie Mckusick, M.D.  DATE OF BIRTH:  February 27, 1971   DATE OF ADMISSION:  03/30/2004  DATE OF DISCHARGE:  12/07/2005LH                                 DISCHARGE SUMMARY   HISTORY OF PRESENT ILLNESS/PAST MEDICAL HISTORY:  Please see admission  history and physical.   HOSPITAL COURSE:  A 40 year old gentleman with a recent diagnosis of  diabetes who was readmitted now for the third time with nausea, vomiting and  uncontrolled blood sugars.  Please see the prior H&P's and discharge  summaries for details. He had been really overall noncompliant with his  medications and that is what has caused his hyperglycemia once again.  He  only eats a life-long diet of [place] fries, potatoes chips and peanut  butter.  This has been a major issue.  GI was consulted once again, please  see their note for details.  He was added on erythromycin as a promotility  agent.  He has finished out his Cipro for a UTI.   Diet was advanced on a daily basis and he improved on a daily basis.  I also  started him on Lantus in the evening,  which we increased to eventually 30  units at night with better blood pressure controls.  He also was encouraged  to eat an ADA proper diet.  IV fluids were decreased a day after admission.   The following two days, he continued to improve from a diabetic standpoint,  blood sugars getting overall down into the 120s to 180s.  We decided to hold  his discharge on the 6th and give him one more day on the increased dose of  Lantus.  He again improved quite a bit.  By December 7, on the day of  discharge, he was ready for home.   DISCHARGE PHYSICAL EXAM:  T-Max 97.8, blood pressure 110/65, heart rate 80,  blood sugars 107, 189 and 350.  When I saw [name] this morning, he was  pleasant,  talkative, joking and in Vazquez acute distress.  Naso- and oro-pharynx  clear with moist mucous membranes.  Chest clear to auscultation bilaterally.  Cardiovascular:  Regular rate, Vazquez murmurs.  Abdomen soft, nontender.   DISCHARGE MEDICATIONS:  1.  Actos 30 mg p.o. q.a.m.  2.  Lantus 30 units subcu q.p.m.  3.  Protonix 40 mg p.o. q.a.m.  4.  Erythromycin 250 p.o. q.i.d. for 7 days as a promotility agent.   FOLLOWUP:  1.  With Dr. Nobie Putnam in one week.  2.  Follow up with GI in one to two weeks for further workup as an      outpatient.   DISCHARGE CONDITION:  Improved and stable.     [name]   JCG/MEDQ  D:  04/03/2004  T:  04/03/2004  Job:  [id number]

## 2010-09-13 NOTE — H&P (Signed)
[name], [name]                  ACCOUNT NO.:  [id number]   MEDICAL RECORD NO.:  [id number]          PATIENT TYPE:  INP   LOCATION:  A227                          FACILITY:  [facility]   PHYSICIAN:  [name], M.D.    DATE OF BIRTH:  09-02-1970   DATE OF ADMISSION:  03/18/2004  DATE OF DISCHARGE:  LH                                HISTORY & PHYSICAL   CHIEF COMPLAINT:  Dry mouth, weight loss, and weakness.   HISTORY OF PRESENT ILLNESS:  [name] with a history of  gastroesophageal reflux disease.  He is also status post nasal fracture in  the remote past.  He smokes approximately two packs per day and uses ethanol  on occasion only.   The patient presented to the office today with several-day history of nausea  and loose stools.  He also reported approximately 10-pound weight loss and  extreme thirst and cotton mouth.  An office evaluation revealed glucose in  his urine as well as ketones  A physical exam was compatible with mild  dehydration.  An Accu-Chek was obtained which read high.  He was sent to  the emergency room for further evaluation.   In the emergency department, he was found to have a blood sugar of  approximately 800 with no ketoacidosis present.  His potassium was 3.7.   The patient is admitted for new onset diabetes mellitus.  Currently he shows  no signs of acidosis and is nontoxic.   There is no history of headache, neurologic deficits, chest pain, shortness  of breath, melena, hematemesis, hematochezia, or genitourinary symptoms  except for polyuria.   CURRENT MEDICATIONS:  P.R.N. Aciphex.   ALLERGIES:  None.  CODEINE causes nausea.   PAST MEDICAL HISTORY:  As noted.   FAMILY HISTORY:  There is a grandmother with diabetes.  Father has  hyperlipidemia.  Mother also has hyperlipidemia and hypertension.  One  sister is alive and well.   REVIEW OF SYSTEMS:  Negative except as mentioned.   SOCIAL HISTORY:  As mentioned.   PHYSICAL  EXAMINATION:  GENERAL:  A very pleasant male who is in no acute  distress.  VITAL SIGNS:  Heart rate 72, blood pressure 118/80, respirations 20 and  unlabored.  No odor of ketones is present.  HEENT:  Normocephalic and atraumatic.  Pupils are equal.  Ears, nose, and  throat are benign except for marked drying of mucous membranes.  NECK:  Supple.  No bruits, thyromegaly, lymphadenopathy noted.  LUNGS:  Clear.  CARDIAC:  Heart sounds are normal.  No murmurs, rubs, or gallops.  ABDOMEN:  Nontender, nondistended.  Bowel sounds are intact.  EXTREMITIES:  No clubbing, cyanosis, or edema.  NEUROLOGIC:  Exam is nonfocal.   IMPRESSION:  New onset diabetes with marked hyperglycemia without evidence  of significant acid-based disturbance at this time.   PLAN:  1.  Admit for high-volume fluids.  2.  Insulin drip.  3.  Potassium supplementation.  4.  Will follow expectantly.     Mark   MC/MEDQ  D:  03/18/2004  T:  03/18/2004  Job:  [id number]

## 2011-01-16 LAB — URINALYSIS, ROUTINE W REFLEX MICROSCOPIC
Bilirubin Urine: NEGATIVE
Glucose, UA: NEGATIVE
Ketones, ur: 40 — AB
Leukocytes, UA: NEGATIVE
Nitrite: NEGATIVE
Protein, ur: NEGATIVE
Specific Gravity, Urine: 1.025
Urobilinogen, UA: 0.2
pH: 5

## 2011-01-16 LAB — COMPREHENSIVE METABOLIC PANEL
AST: 20
Albumin: 2.9 — ABNORMAL LOW
Alkaline Phosphatase: 39
Chloride: 111
GFR calc Af Amer: >60
Potassium: 3.6
Total Bilirubin: 0.9

## 2011-01-16 LAB — CBC
HCT: 50.8
HCT: 39.6
Hemoglobin: 17.6 — ABNORMAL HIGH
Hemoglobin: 13.7
MCHC: 34.7
MCHC: 34.7
MCV: 88.7
MCV: 89.6
Platelets: 166
Platelets: 121 — ABNORMAL LOW
RBC: 5.72
RBC: 4.42
RDW: 13.2
RDW: 13.6
WBC: 16.3 — ABNORMAL HIGH
WBC: 7.4

## 2011-01-16 LAB — BASIC METABOLIC PANEL
BUN: 22
BUN: 29 — ABNORMAL HIGH
CO2: 19
CO2: 23
Calcium: 7.6 — ABNORMAL LOW
Calcium: 9.5
Chloride: 109
Chloride: 113 — ABNORMAL HIGH
Creatinine, Ser: 1.38
Creatinine, Ser: 1.42
GFR calc Af Amer: >60
GFR calc Af Amer: >60
GFR calc non Af Amer: 56 — ABNORMAL LOW
GFR calc non Af Amer: 58 — ABNORMAL LOW
Glucose, Bld: 280 — ABNORMAL HIGH
Glucose, Bld: 76
Potassium: 3.9
Potassium: 4
Sodium: 137
Sodium: 140

## 2011-01-16 LAB — DIFFERENTIAL
Basophils Absolute: 0
Basophils Absolute: 0
Basophils Relative: 0
Basophils Relative: 0
Eosinophils Absolute: 0.1
Eosinophils Absolute: 0.1
Eosinophils Relative: 0
Eosinophils Relative: 1
Lymphocytes Relative: 4 — ABNORMAL LOW
Lymphs Abs: 0.7
Monocytes Absolute: 0.7
Monocytes Absolute: 0.6
Monocytes Relative: 4
Neutro Abs: 14.9 — ABNORMAL HIGH
Neutrophils Relative %: 91 — ABNORMAL HIGH

## 2011-01-16 LAB — KETONES, QUALITATIVE: Acetone, Bld: NEGATIVE

## 2011-01-16 LAB — URINE MICROSCOPIC-ADD ON: Urine-Other: NONE SEEN

## 2011-02-07 ENCOUNTER — Encounter: Payer: Self-pay | Admitting: *Deleted

## 2011-02-07 ENCOUNTER — Emergency Department (HOSPITAL_COMMUNITY)
Admission: EM | Admit: 2011-02-07 | Discharge: 2011-02-07 | Disposition: A | Discharge status: Home / Self Care | Payer: Managed Care, Other (non HMO) | Attending: Emergency Medicine | Admitting: Emergency Medicine

## 2011-02-07 DIAGNOSIS — R197 Diarrhea, unspecified: Secondary | ICD-10-CM

## 2011-02-07 DIAGNOSIS — Z794 Long term (current) use of insulin: Secondary | ICD-10-CM | POA: Insufficient documentation

## 2011-02-07 DIAGNOSIS — E1169 Type 2 diabetes mellitus with other specified complication: Secondary | ICD-10-CM | POA: Insufficient documentation

## 2011-02-07 DIAGNOSIS — E162 Hypoglycemia, unspecified: Secondary | ICD-10-CM

## 2011-02-07 LAB — CBC
HCT: 45.3 % (ref 39.0–52.0)
MCHC: 34 g/dL (ref 30.0–36.0)
MCV: 89.2 fL (ref 78.0–100.0)
RDW: 12.2 % (ref 11.5–15.5)
WBC: 8.6 10*3/uL (ref 4.0–10.5)

## 2011-02-07 LAB — BASIC METABOLIC PANEL
BUN: 18 mg/dL (ref 6–23)
CO2: 27 mEq/L (ref 19–32)
Chloride: 100 mEq/L (ref 96–112)
Creatinine, Ser: 1.13 mg/dL (ref 0.50–1.35)

## 2011-02-07 LAB — URINALYSIS, ROUTINE W REFLEX MICROSCOPIC
Bilirubin Urine: NEGATIVE
Hgb urine dipstick: NEGATIVE
Specific Gravity, Urine: 1.025 (ref 1.005–1.030)
Urobilinogen, UA: 0.2 mg/dL (ref 0.0–1.0)

## 2011-02-07 MED ORDER — SODIUM CHLORIDE 0.9 % IV SOLN: INTRAVENOUS | Status: DC

## 2011-02-07 MED ORDER — ONDANSETRON 8 MG PO TBDP: 8.0000 mg | ORAL_TABLET | Freq: Three times a day (TID) | ORAL | Status: AC | PRN

## 2011-02-07 MED ORDER — SODIUM CHLORIDE 0.9 % IV BOLUS (SEPSIS): 500.0000 mL | Freq: Once | INTRAVENOUS | Status: DC

## 2011-02-07 NOTE — ED Notes (Signed)
Expressing hunger---snack given and pt. Tolerated well.

## 2011-02-07 NOTE — ED Notes (Signed)
Sent from Dr Lamar Blinks office for evaluation, patient is a diabetic and is on an insulin pump

## 2011-02-07 NOTE — ED Notes (Signed)
Temp retaken 100.3  Pt. Reports going to bathroom few minutes ago and having diarrhea again.  Dr. notified

## 2011-02-07 NOTE — ED Notes (Signed)
Pt drinking coke at time of IV insertion. Pt alert and oriented with family at bedside

## 2011-02-07 NOTE — ED Provider Notes (Addendum)
Scribed for [employer]. Rubin Payor, MD, the patient was seen in room APA12/APA12. This chart was scribed by [employer]. The patient's care started at 15:11  CSN: [id number] Arrival date & time: 02/07/2011  1:48 PM  Chief Complaint  Patient presents with  . Diabetes   HPI [name] is a 40 y.o. male with a history of diabetes mellitus who presents to the Emergency Department complaining of diarrhea, onset 9pm yesterday. Patient reports recording his blood sugar at 50. He was sent from Dr Lamar Blinks office for evaluation. Patient is a diabetic and has an insulin pump. Reports sick contact with wife. Blood sugar is 102 at this time.  History reviewed. No pertinent past medical history.  History reviewed. No pertinent past surgical history.  No family history on file.  History  Substance Use Topics  . Smoking status: Not on file  . Smokeless tobacco: Not on file  . Alcohol Use: Yes      Review of Systems  Gastrointestinal: Positive for diarrhea.  All other systems reviewed and are negative.    Allergies  Codeine and Glucophage  Home Medications   Current Outpatient Rx  Name Route Sig Dispense Refill  . INSULIN PUMP Subcutaneous Inject into the skin daily. NOVOLOG: Consistantly     . LISINOPRIL 5 MG PO TABS Oral Take 5 mg by mouth every morning.      Marland Kitchen PANTOPRAZOLE SODIUM 40 MG PO TBEC Oral Take 40 mg by mouth every morning.      Marland Kitchen SIMVASTATIN 5 MG PO TABS Oral Take 5 mg by mouth every morning.        BP 101/57  Pulse 107  Temp(Src) 98.5 F (36.9 C) (Oral)  Resp 20  Ht 5\' 6"  (1.676 m)  Wt 188 lb (85.276 kg)  BMI 30.34 kg/m2  SpO2 97%  Physical Exam  Nursing note and vitals reviewed. Constitutional: He is oriented to person, place, and time. He appears well-developed and well-nourished. No distress.  HENT:  Head: Normocephalic and atraumatic.  Right Ear: Tympanic membrane is not erythematous and not bulging.  Left Ear: Tympanic membrane is not erythematous  and not bulging.  Mouth/Throat: Oropharynx is clear and moist. No oropharyngeal exudate, posterior oropharyngeal edema or posterior oropharyngeal erythema.       Opaque eardrums bilaterally  Eyes: Conjunctivae are normal. Right eye exhibits no discharge. Left eye exhibits no discharge.  Neck: No tracheal deviation present.  Cardiovascular: Normal rate.   Pulmonary/Chest: Effort normal.       Breaths harsh bilaterally  Abdominal: Soft. Bowel sounds are normal. There is no tenderness.       Insulin needle noted in the LLQ of abdomen. Mild erythema  Lymphadenopathy:    He has no cervical adenopathy.  Neurological: He is alert and oriented to person, place, and time. No cranial nerve deficit.  Skin: Skin is warm and dry. No rash noted. He is not diaphoretic.  Psychiatric: He has a normal mood and affect. His behavior is normal.    ED Course  Procedures   DIAGNOSTIC STUDIES: Oxygen Saturation is 97% on room air, normal by my interpretation.    COORDINATION OF CARE:  15:08 - EDP examined patient and ordered blood work and fluids. Patient advised to ensure nutrient and fluid intake to compensate for nutrient and fluid loss from diarrhea. Orders Placed This Encounter  Procedures  . Glucose, capillary  . Basic metabolic panel  . CBC  . Urinalysis with microscopic  . Encourage fluids  Labs and Radiology   Results for orders placed during the hospital encounter of 02/07/11  GLUCOSE, CAPILLARY      Component Value Range   Glucose-Capillary 64 (*) 70 - 99 (mg/dL)   Comment 1 Notify RN    BASIC METABOLIC PANEL      Component Value Range   Sodium 136  135 - 145 (mEq/L)   Potassium 3.6  3.5 - 5.1 (mEq/L)   Chloride 100  96 - 112 (mEq/L)   CO2 27  19 - 32 (mEq/L)   Glucose, Bld 92  70 - 99 (mg/dL)   BUN 18  6 - 23 (mg/dL)   Creatinine, Ser 1.61  0.50 - 1.35 (mg/dL)   Calcium 8.8  8.4 - 09.6 (mg/dL)   GFR calc non Af Amer 79 (*) >90 (mL/min)   GFR calc Af Amer >90  >90 (mL/min)    CBC      Component Value Range   WBC 8.6  4.0 - 10.5 (K/uL)   RBC 5.08  4.22 - 5.81 (MIL/uL)   Hemoglobin 15.4  13.0 - 17.0 (g/dL)   HCT 04.5  40.9 - 81.1 (%)   MCV 89.2  78.0 - 100.0 (fL)   MCH 30.3  26.0 - 34.0 (pg)   MCHC 34.0  30.0 - 36.0 (g/dL)   RDW 91.4  78.2 - 95.6 (%)   Platelets 147 (*) 150 - 400 (K/uL)  URINALYSIS, ROUTINE W REFLEX MICROSCOPIC      Component Value Range   Color, Urine YELLOW  YELLOW    Appearance CLEAR  CLEAR    Specific Gravity, Urine 1.025  1.005 - 1.030    pH 5.5  5.0 - 8.0    Glucose, UA NEGATIVE  NEGATIVE (mg/dL)   Hgb urine dipstick NEGATIVE  NEGATIVE    Bilirubin Urine NEGATIVE  NEGATIVE    Ketones, ur NEGATIVE  NEGATIVE (mg/dL)   Protein, ur NEGATIVE  NEGATIVE (mg/dL)   Urobilinogen, UA 0.2  0.0 - 1.0 (mg/dL)   Nitrite NEGATIVE  NEGATIVE    Leukocytes, UA NEGATIVE  NEGATIVE      Patient came into the ER for diarrhea and generalized weakness. His sugars have dropped down to the 60s, however it was improved with treatment Coke. He has tolerated orals here. His labs are reassuring. He feels much better wants to go home. He will be discharged with Zofran to ensure oral intake. He is to followup with Dr. Phillips Odor.    Scribe Attestation I personally performed the services described in this documentation, which was scribed in my presence. The recorded information has been reviewed and considered. Juliet Rude. Rubin Payor, MD  Juliet Rude. Rubin Payor, MD 02/07/11 1637  Juliet Rude. Rubin Payor, MD 02/07/11 1649

## 2011-02-07 NOTE — ED Notes (Signed)
In to see pt.--states he has an insulin pump and has had diarrhea for the past few days---knows his blood sugar is low usually but, for the last few days is unable to tell when it drops.  Pt. Is alert and oriented with clear concise speech--states he can drink Coke---Coke given to pt.

## 2011-02-08 ENCOUNTER — Encounter (HOSPITAL_COMMUNITY): Payer: Self-pay | Admitting: *Deleted

## 2011-02-08 ENCOUNTER — Emergency Department (HOSPITAL_COMMUNITY)
Admission: EM | Admit: 2011-02-08 | Discharge: 2011-02-08 | Disposition: A | Discharge status: Home / Self Care | Payer: Managed Care, Other (non HMO) | Attending: Emergency Medicine | Admitting: Emergency Medicine

## 2011-02-08 DIAGNOSIS — E869 Volume depletion, unspecified: Secondary | ICD-10-CM | POA: Insufficient documentation

## 2011-02-08 DIAGNOSIS — E119 Type 2 diabetes mellitus without complications: Secondary | ICD-10-CM | POA: Insufficient documentation

## 2011-02-08 DIAGNOSIS — R000 Tachycardia, unspecified: Secondary | ICD-10-CM | POA: Insufficient documentation

## 2011-02-08 DIAGNOSIS — R197 Diarrhea, unspecified: Secondary | ICD-10-CM | POA: Insufficient documentation

## 2011-02-08 DIAGNOSIS — R112 Nausea with vomiting, unspecified: Secondary | ICD-10-CM | POA: Insufficient documentation

## 2011-02-08 DIAGNOSIS — Z794 Long term (current) use of insulin: Secondary | ICD-10-CM | POA: Insufficient documentation

## 2011-02-08 DIAGNOSIS — F17200 Nicotine dependence, unspecified, uncomplicated: Secondary | ICD-10-CM | POA: Insufficient documentation

## 2011-02-08 LAB — BASIC METABOLIC PANEL
BUN: 17 mg/dL (ref 6–23)
CO2: 25 mEq/L (ref 19–32)
Calcium: 8.9 mg/dL (ref 8.4–10.5)
Creatinine, Ser: 1.12 mg/dL (ref 0.50–1.35)
Glucose, Bld: 86 mg/dL (ref 70–99)

## 2011-02-08 LAB — CBC
HCT: 46.6 % (ref 39.0–52.0)
Hemoglobin: 16.2 g/dL (ref 13.0–17.0)
MCH: 30.9 pg (ref 26.0–34.0)
MCV: 88.9 fL (ref 78.0–100.0)
Platelets: 136 10*3/uL — ABNORMAL LOW (ref 150–400)
RBC: 5.24 MIL/uL (ref 4.22–5.81)

## 2011-02-08 MED ORDER — ONDANSETRON HCL 4 MG/2ML IJ SOLN
4.0000 mg | Freq: Once | INTRAMUSCULAR | Status: AC
  Administered 2011-02-08: 4 mg via INTRAVENOUS
  Filled 2011-02-08: qty 2

## 2011-02-08 MED ORDER — ONDANSETRON HCL 4 MG PO TABS: 8.0000 mg | ORAL_TABLET | Freq: Three times a day (TID) | ORAL | Status: AC | PRN

## 2011-02-08 MED ORDER — LORAZEPAM 2 MG/ML IJ SOLN
0.5000 mg | Freq: Once | INTRAMUSCULAR | Status: AC
  Administered 2011-02-08: 03:00:00 via INTRAVENOUS
  Filled 2011-02-08: qty 1

## 2011-02-08 MED ORDER — SODIUM CHLORIDE 0.9 % IV BOLUS (SEPSIS)
1000.0000 mL | Freq: Once | INTRAVENOUS | Status: AC
  Administered 2011-02-08: 1000 mL via INTRAVENOUS

## 2011-02-08 NOTE — ED Notes (Signed)
Pt c/o fever, chills, n/v/d. Pt was seen here earlier today. Pts blood sugar 90.

## 2011-02-08 NOTE — ED Notes (Signed)
Says he is feeling better.

## 2011-02-08 NOTE — ED Provider Notes (Signed)
History     CSN: [id number] Arrival date & time: 02/08/2011 12:55 AM  Chief Complaint  Patient presents with  . Fever  . Chills  . Nausea  . Emesis  . Diarrhea    HPI The patient reports approximately 36 hours of diarrhea with the development of new nausea and vomiting as well as fever and chills this evening.  He denies abdominal pain.  He reports no bloody emesis or bilious emesis.  He denies melena and hematochezia.  He reports no chest pain or shortness of breath.  Reports an nothing improves the symptoms and nothing worsens the symptoms.  He does have a history of diabetes on an insulin pump.  Reports of his blood sugars have been in the 90s and even ranging as low as the 60s.  He currently has an insulin pump with ongoing basal rate.  During the history he was instructed to turn his basal rate off.  The symptoms are described as moderate in nature.  At the time of my evaluation fluids and antibiotics had been given the patient's nausea was significantly improved  Past Medical History  Diagnosis Date  . Diabetes mellitus     History reviewed. No pertinent past surgical history.  History reviewed. No pertinent family history.  History  Substance Use Topics  . Smoking status: Current Everyday Smoker  . Smokeless tobacco: Not on file  . Alcohol Use: No      Review of Systems  All other systems reviewed and are negative.    Allergies  Codeine and Glucophage  Home Medications   Current Outpatient Rx  Name Route Sig Dispense Refill  . INSULIN PUMP Subcutaneous Inject into the skin daily. NOVOLOG: Consistantly     . LISINOPRIL 5 MG PO TABS Oral Take 5 mg by mouth every morning.      Marland Kitchen ONDANSETRON 8 MG PO TBDP Oral Take 1 tablet (8 mg total) by mouth every 8 (eight) hours as needed for nausea. 12 tablet 0  . PANTOPRAZOLE SODIUM 40 MG PO TBEC Oral Take 40 mg by mouth every morning.      Marland Kitchen SIMVASTATIN 5 MG PO TABS Oral Take 5 mg by mouth every morning.        BP  113/68  Pulse 98  Temp(Src) 98.7 F (37.1 C) (Oral)  Resp 20  Wt 188 lb (85.276 kg)  SpO2 94%  Physical Exam  Nursing note and vitals reviewed. Constitutional: He is oriented to person, place, and time. He appears well-developed and well-nourished.  HENT:  Head: Normocephalic and atraumatic.  Eyes: EOM are normal.  Neck: Normal range of motion.  Cardiovascular: Regular rhythm, normal heart sounds and intact distal pulses.        Tachycardia  Pulmonary/Chest: Effort normal and breath sounds normal. No respiratory distress.  Abdominal: Soft. He exhibits no distension. There is no tenderness.  Musculoskeletal: Normal range of motion.  Neurological: He is alert and oriented to person, place, and time.  Skin: Skin is warm and dry.  Psychiatric: He has a normal mood and affect. Judgment normal.    ED Course  Procedures (including critical care time)  Labs Reviewed  CBC - Abnormal; Notable for the following:    WBC 11.0 (*)    Platelets 136 (*)    All other components within normal limits  BASIC METABOLIC PANEL - Abnormal; Notable for the following:    GFR calc non Af Amer 79 (*)    All other components within normal limits  GLUCOSE, CAPILLARY  POCT CBG MONITORING   No results found.   1. Nausea and vomiting   2. Diarrhea   3. Volume depletion       MDM  The patient's symptoms seem consistent with viral gastroenteritis.  His abdominal exam is benign.  His labs are normal.  Been instructed to keep his basal rate off until he begins taking by mouth fluids and food more regularly at home over the next 24 to 48 hours.  Awaiting recheck of his heart at this time.         Lyanne Co, MD 02/08/11 [phone]

## 2011-02-08 NOTE — ED Notes (Signed)
NVD, seen here earlier for same, Did not get med filled for nausea.  Alert, NAd.  Family at bedside.

## 2011-04-18 ENCOUNTER — Encounter (HOSPITAL_COMMUNITY): Payer: Self-pay

## 2011-04-18 ENCOUNTER — Inpatient Hospital Stay (HOSPITAL_COMMUNITY)
Admission: EM | Admit: 2011-04-18 | Discharge: 2011-04-19 | DRG: 638 | Disposition: A | Discharge status: Home / Self Care | Payer: Managed Care, Other (non HMO) | Attending: Internal Medicine | Admitting: Internal Medicine

## 2011-04-18 DIAGNOSIS — N182 Chronic kidney disease, stage 2 (mild): Secondary | ICD-10-CM | POA: Diagnosis present

## 2011-04-18 DIAGNOSIS — Z9641 Presence of insulin pump (external) (internal): Secondary | ICD-10-CM

## 2011-04-18 DIAGNOSIS — Z794 Long term (current) use of insulin: Secondary | ICD-10-CM

## 2011-04-18 DIAGNOSIS — B9789 Other viral agents as the cause of diseases classified elsewhere: Secondary | ICD-10-CM | POA: Diagnosis present

## 2011-04-18 DIAGNOSIS — E1110 Type 2 diabetes mellitus with ketoacidosis without coma: Secondary | ICD-10-CM | POA: Diagnosis present

## 2011-04-18 DIAGNOSIS — R739 Hyperglycemia, unspecified: Secondary | ICD-10-CM

## 2011-04-18 DIAGNOSIS — Z720 Tobacco use: Secondary | ICD-10-CM | POA: Diagnosis present

## 2011-04-18 DIAGNOSIS — J209 Acute bronchitis, unspecified: Secondary | ICD-10-CM | POA: Diagnosis present

## 2011-04-18 DIAGNOSIS — F17200 Nicotine dependence, unspecified, uncomplicated: Secondary | ICD-10-CM | POA: Diagnosis present

## 2011-04-18 DIAGNOSIS — R112 Nausea with vomiting, unspecified: Secondary | ICD-10-CM | POA: Diagnosis present

## 2011-04-18 DIAGNOSIS — R7989 Other specified abnormal findings of blood chemistry: Secondary | ICD-10-CM | POA: Diagnosis present

## 2011-04-18 DIAGNOSIS — I498 Other specified cardiac arrhythmias: Secondary | ICD-10-CM | POA: Diagnosis present

## 2011-04-18 DIAGNOSIS — N179 Acute kidney failure, unspecified: Secondary | ICD-10-CM | POA: Diagnosis present

## 2011-04-18 DIAGNOSIS — E162 Hypoglycemia, unspecified: Secondary | ICD-10-CM | POA: Diagnosis not present

## 2011-04-18 DIAGNOSIS — Z8709 Personal history of other diseases of the respiratory system: Secondary | ICD-10-CM | POA: Diagnosis present

## 2011-04-18 DIAGNOSIS — D696 Thrombocytopenia, unspecified: Secondary | ICD-10-CM | POA: Diagnosis present

## 2011-04-18 DIAGNOSIS — R000 Tachycardia, unspecified: Secondary | ICD-10-CM | POA: Diagnosis present

## 2011-04-18 DIAGNOSIS — E871 Hypo-osmolality and hyponatremia: Secondary | ICD-10-CM | POA: Diagnosis present

## 2011-04-18 DIAGNOSIS — E1310 Other specified diabetes mellitus with ketoacidosis without coma: Principal | ICD-10-CM | POA: Diagnosis present

## 2011-04-18 DIAGNOSIS — E538 Deficiency of other specified B group vitamins: Secondary | ICD-10-CM | POA: Diagnosis present

## 2011-04-18 DIAGNOSIS — D6959 Other secondary thrombocytopenia: Secondary | ICD-10-CM | POA: Diagnosis present

## 2011-04-18 HISTORY — DX: Unspecified hearing loss, unspecified ear: H91.90

## 2011-04-18 HISTORY — DX: Hyperlipidemia, unspecified: E78.5

## 2011-04-18 HISTORY — DX: Personal history of other diseases of the respiratory system: Z87.09

## 2011-04-18 HISTORY — DX: Deficiency of other specified B group vitamins: E53.8

## 2011-04-18 HISTORY — DX: Thrombocytopenia, unspecified: D69.6

## 2011-04-18 HISTORY — DX: Chronic kidney disease, stage 2 (mild): N18.2

## 2011-04-18 LAB — BASIC METABOLIC PANEL
BUN: 29 mg/dL — ABNORMAL HIGH (ref 6–23)
BUN: 21 mg/dL (ref 6–23)
CO2: 22 mEq/L (ref 19–32)
CO2: 23 mEq/L (ref 19–32)
Calcium: 8.2 mg/dL — ABNORMAL LOW (ref 8.4–10.5)
Chloride: 95 mEq/L — ABNORMAL LOW (ref 96–112)
Chloride: 107 mEq/L (ref 96–112)
Chloride: 105 mEq/L (ref 96–112)
Creatinine, Ser: 1.45 mg/dL — ABNORMAL HIGH (ref 0.50–1.35)
Creatinine, Ser: 1.09 mg/dL (ref 0.50–1.35)
GFR calc Af Amer: >90 mL/min (ref >90)
Glucose, Bld: 620 mg/dL (ref 70–99)
Glucose, Bld: 308 mg/dL — ABNORMAL HIGH (ref 70–99)
Potassium: 4.2 mEq/L (ref 3.5–5.1)
Potassium: 4.5 mEq/L (ref 3.5–5.1)
Sodium: 139 mEq/L (ref 135–145)

## 2011-04-18 LAB — CBC
HCT: 48.2 % (ref 39.0–52.0)
Hemoglobin: 16.4 g/dL (ref 13.0–17.0)
MCHC: 34 g/dL (ref 30.0–36.0)
MCV: 89.9 fL (ref 78.0–100.0)
RDW: 12.4 % (ref 11.5–15.5)

## 2011-04-18 LAB — GLUCOSE, CAPILLARY
Glucose-Capillary: 115 mg/dL — ABNORMAL HIGH (ref 70–99)
Glucose-Capillary: 287 mg/dL — ABNORMAL HIGH (ref 70–99)
Glucose-Capillary: 336 mg/dL — ABNORMAL HIGH (ref 70–99)
Glucose-Capillary: 364 mg/dL — ABNORMAL HIGH (ref 70–99)
Glucose-Capillary: 414 mg/dL — ABNORMAL HIGH (ref 70–99)
Glucose-Capillary: 506 mg/dL — ABNORMAL HIGH (ref 70–99)

## 2011-04-18 LAB — HEMOGLOBIN A1C: Hgb A1c MFr Bld: 8.1 % — ABNORMAL HIGH (ref <5.7)

## 2011-04-18 LAB — DIFFERENTIAL
Basophils Absolute: 0 10*3/uL (ref 0.0–0.1)
Basophils Relative: 1 % (ref 0–1)
Eosinophils Relative: 0 % (ref 0–5)
Monocytes Absolute: 1.4 10*3/uL — ABNORMAL HIGH (ref 0.1–1.0)
Monocytes Relative: 17 % — ABNORMAL HIGH (ref 3–12)

## 2011-04-18 LAB — HEPATIC FUNCTION PANEL
ALT: 32 U/L (ref 0–53)
AST: 23 U/L (ref 0–37)
Albumin: 3.8 g/dL (ref 3.5–5.2)
Total Protein: 7.2 g/dL (ref 6.0–8.3)

## 2011-04-18 LAB — TSH: TSH: 1.071 u[IU]/mL (ref 0.350–4.500)

## 2011-04-18 LAB — VITAMIN B12: Vitamin B-12: 175 pg/mL — ABNORMAL LOW (ref 211–911)

## 2011-04-18 MED ORDER — HYDROCOD POLST-CHLORPHEN POLST 10-8 MG/5ML PO LQCR: 5.0000 mL | Freq: Two times a day (BID) | ORAL | Status: DC | PRN

## 2011-04-18 MED ORDER — INSULIN NPH (HUMAN) (ISOPHANE) 100 UNIT/ML ~~LOC~~ SUSP
SUBCUTANEOUS | Status: AC
  Filled 2011-04-18: qty 1

## 2011-04-18 MED ORDER — TRAZODONE HCL 50 MG PO TABS
25.0000 mg | ORAL_TABLET | Freq: Every evening | ORAL | Status: DC | PRN
  Filled 2011-04-18: qty 0.5

## 2011-04-18 MED ORDER — INSULIN ASPART 100 UNIT/ML ~~LOC~~ SOLN
0.0000 [IU] | SUBCUTANEOUS | Status: DC
  Administered 2011-04-18: 4 [IU] via SUBCUTANEOUS
  Administered 2011-04-18: 20 [IU] via SUBCUTANEOUS
  Administered 2011-04-18: 11 [IU] via SUBCUTANEOUS
  Administered 2011-04-18: 4 [IU] via SUBCUTANEOUS
  Filled 2011-04-18: qty 3

## 2011-04-18 MED ORDER — BENZONATATE 100 MG PO CAPS
100.0000 mg | ORAL_CAPSULE | Freq: Three times a day (TID) | ORAL | Status: DC
  Administered 2011-04-18 – 2011-04-19 (×4): 100 mg via ORAL
  Filled 2011-04-18 (×4): qty 1

## 2011-04-18 MED ORDER — LEVALBUTEROL HCL 0.63 MG/3ML IN NEBU
0.6300 mg | INHALATION_SOLUTION | Freq: Three times a day (TID) | RESPIRATORY_TRACT | Status: DC
  Administered 2011-04-18 – 2011-04-19 (×3): 0.63 mg via RESPIRATORY_TRACT
  Filled 2011-04-18 (×4): qty 3

## 2011-04-18 MED ORDER — SODIUM CHLORIDE 0.9 % IV BOLUS (SEPSIS)
1000.0000 mL | Freq: Once | INTRAVENOUS | Status: AC
  Administered 2011-04-18: 1000 mL via INTRAVENOUS

## 2011-04-18 MED ORDER — ONDANSETRON HCL 4 MG/2ML IJ SOLN
4.0000 mg | Freq: Once | INTRAMUSCULAR | Status: AC
  Administered 2011-04-18: 4 mg via INTRAVENOUS
  Filled 2011-04-18: qty 2

## 2011-04-18 MED ORDER — SODIUM CHLORIDE 0.9 % IV SOLN
INTRAVENOUS | Status: DC
  Administered 2011-04-18: 1000 mL via INTRAVENOUS

## 2011-04-18 MED ORDER — DEXTROSE 50 % IV SOLN: 25.0000 mL | INTRAVENOUS | Status: DC | PRN

## 2011-04-18 MED ORDER — POTASSIUM CHLORIDE IN NACL 20-0.9 MEQ/L-% IV SOLN
INTRAVENOUS | Status: DC
  Administered 2011-04-18: 1000 mL via INTRAVENOUS
  Administered 2011-04-18: 23:00:00 via INTRAVENOUS
  Filled 2011-04-18: qty 1000

## 2011-04-18 MED ORDER — ALUM & MAG HYDROXIDE-SIMETH 200-200-20 MG/5ML PO SUSP: 30.0000 mL | Freq: Four times a day (QID) | ORAL | Status: DC | PRN

## 2011-04-18 MED ORDER — SODIUM CHLORIDE 0.9 % IJ SOLN
INTRAMUSCULAR | Status: AC
  Administered 2011-04-19
  Filled 2011-04-18: qty 3

## 2011-04-18 MED ORDER — SODIUM CHLORIDE 0.9 % IV SOLN
INTRAVENOUS | Status: DC
  Administered 2011-04-18: 07:00:00 via INTRAVENOUS
  Filled 2011-04-18: qty 1

## 2011-04-18 MED ORDER — ACETAMINOPHEN 650 MG RE SUPP: 650.0000 mg | Freq: Four times a day (QID) | RECTAL | Status: DC | PRN

## 2011-04-18 MED ORDER — INSULIN GLARGINE 100 UNIT/ML ~~LOC~~ SOLN
10.0000 [IU] | Freq: Every day | SUBCUTANEOUS | Status: DC
  Administered 2011-04-18: 10 [IU] via SUBCUTANEOUS
  Filled 2011-04-18: qty 3

## 2011-04-18 MED ORDER — ONDANSETRON HCL 4 MG PO TABS: 4.0000 mg | ORAL_TABLET | Freq: Four times a day (QID) | ORAL | Status: DC | PRN

## 2011-04-18 MED ORDER — INSULIN REGULAR HUMAN 100 UNIT/ML IJ SOLN
INTRAMUSCULAR | Status: AC
  Filled 2011-04-18: qty 3

## 2011-04-18 MED ORDER — ACETAMINOPHEN 325 MG PO TABS: 650.0000 mg | ORAL_TABLET | Freq: Four times a day (QID) | ORAL | Status: DC | PRN

## 2011-04-18 MED ORDER — INSULIN NPH (HUMAN) (ISOPHANE) 100 UNIT/ML ~~LOC~~ SUSP
5.0000 [IU] | Freq: Once | SUBCUTANEOUS | Status: AC
  Administered 2011-04-18: 5 [IU] via SUBCUTANEOUS
  Filled 2011-04-18: qty 10

## 2011-04-18 MED ORDER — INSULIN REGULAR BOLUS VIA INFUSION
0.0000 [IU] | Freq: Three times a day (TID) | INTRAVENOUS | Status: DC
  Filled 2011-04-18 (×5): qty 10

## 2011-04-18 MED ORDER — HYDROCODONE-ACETAMINOPHEN 5-325 MG PO TABS: 1.0000 | ORAL_TABLET | ORAL | Status: DC | PRN

## 2011-04-18 MED ORDER — DEXTROSE-NACL 5-0.45 % IV SOLN: INTRAVENOUS | Status: DC

## 2011-04-18 MED ORDER — ONDANSETRON HCL 4 MG/2ML IJ SOLN: 4.0000 mg | Freq: Four times a day (QID) | INTRAMUSCULAR | Status: DC | PRN

## 2011-04-18 NOTE — H&P (Signed)
[name] MRN: [id number] DOB/AGE: Apr 13, 1971 40 y.o. Primary Care Physician:GOLDING,JOHN CABOT, MD, MD Admit date: 04/18/2011 Chief Complaint: Nausea and vomiting. Recent flulike symptoms. HPI: The patient is a 40 year old man with a past medical history significant for type II, insulin-dependent diabetes mellitus, who presents to the emergency department with a chief complaint of nausea and vomiting. His symptoms started 3 days ago. He has been vomiting constantly over the past 48 hours. He denies abdominal pain, diarrhea, or constipation. He has had no coffee grounds emesis. He is also had flulike symptoms with sore throat, cough, body aches, and subjective fever and chills. He was seen by Dr. Lamar Blinks PA a couple of days ago. He received what sounds to be a steroid injection and was prescribed Z-Pak. He has been unable to take the Z-Pak because of the vomiting. He continues to have a nonproductive cough and some chest congestion. He admits that he has not checked his blood sugars over the past 2-3 days because he has not eaten very much. Therefore, he has not adjusted his insulin pump and he does not know what his blood sugars have been running. He denies any prior history of DKA.  In the emergency department, the patient was initially tachycardic, but now his heart rate is within normal limits following 3 L of IV fluids. His blood pressure is within normal limits. His lab data are significant for a serum sodium of 134, chloride of 95, CO2 of 22, BUN of 29, creatinine of 1.45, and glucose of 620. His platelet count is 135. His WBC is within normal limits at 8.7. His ABG on room air reveals a pH of 7.35, PCO2 of 37, and PO2 of 68. He is being admitted for further evaluation and management.  Past Medical History  Diagnosis Date  . DM2 (diabetes mellitus, type 2)   . Acid reflux   . CKD (chronic kidney disease), stage II   . Hyperlipidemia   . Thrombocytopenia   . HTN (hypertension)   . History  of influenza 04/18/2011  . Tobacco abuse 04/18/2011    History reviewed. No pertinent past surgical history.  Prior to Admission medications   Medication Sig Start Date End Date Taking? Authorizing Provider  Insulin Human (INSULIN PUMP) 100 unit/ml SOLN Inject into the skin daily. NOVOLOG: Consistantly    Yes Historical Provider, MD  lisinopril (PRINIVIL,ZESTRIL) 5 MG tablet Take 5 mg by mouth every morning.     Yes Historical Provider, MD  pantoprazole (PROTONIX) 40 MG tablet Take 40 mg by mouth every morning.     Yes Historical Provider, MD  simvastatin (ZOCOR) 5 MG tablet Take 5 mg by mouth every morning.    Yes Historical Provider, MD    Allergies:  Allergies  Allergen Reactions  . Codeine Nausea Only    Drowsiness  . Glucophage (Metformin Hydrochloride) Other (See Comments)    Vomiting and cramps    Family history: His mother is 67 years of age and has a history of rest cancer and thyroid problems. His father is 80 years of age and has diabetes mellitus, hypertension, and atrial fibrillation.  Social History: He is married. He lives in [place]. He is employed. He smokes a pack of cigarettes per day. He denies alcohol and illicit drug use. He has no children.    ROS: As above in history of present illness, otherwise review of systems is negative.  PHYSICAL EXAM: Blood pressure 115/73, pulse 96, temperature 97.8 F (36.6 C), temperature source Oral, resp.  rate 18, height 5\' 8"  (1.727 m), weight 81.647 kg (180 lb), SpO2 97.00%.  General: Pleasant 40 year old Caucasian man who is currently lying in bed, in no acute distress. HEENT: Head is normocephalic and nontraumatic. Pupils are equal, round, and reactive to light. Extraocular movements are intact. Conjunctivae are clear. Sclerae are white. Tympanic membranes are clear bilaterally. Nasal mucosa is moist with no sinus tenderness and no active rhinorrhea. Oropharynx reveals mildly dry mucous membranes. No posterior exudates  or erythema.  Neck: Supple, no adenopathy, no thyromegaly, no JVD. Lungs: Occasional wheezes auscultated bilaterally. Breathing is nonlabored. Heart: S1, S2, with a soft systolic murmur. Extremities: No pedal edema. Pedal pulses palpable. Neurologic: He is alert and oriented x3. Cranial nerves II through XII are intact.  Basic Metabolic Panel:  Basename 04/18/11 0825 04/18/11 0559  NA 139 134*  K 4.2 4.1  CL 107 95*  CO2 23 22  GLUCOSE 308* 620*  BUN 24* 29*  CREATININE 1.16 1.45*  CALCIUM 8.2* 9.7  MG -- --  PHOS -- --   Liver Function Tests:  [facility] 04/18/11 0559  AST 23  ALT 32  ALKPHOS 60  BILITOT 0.7  PROT 7.2  ALBUMIN 3.8    Basename 04/18/11 0559  LIPASE 11  AMYLASE --   No results found for this basename: AMMONIA:2 in the last 72 hours CBC:  Basename 04/18/11 0559  WBC 8.7  NEUTROABS 6.0  HGB 16.4  HCT 48.2  MCV 89.9  PLT 135*   Cardiac Enzymes: No results found for this basename: CKTOTAL:3,CKMB:3,CKMBINDEX:3,TROPONINI:3 in the last 72 hours BNP: No components found with this basename: POCBNP:3 D-Dimer: No results found for this basename: DDIMER:2 in the last 72 hours CBG:  Basename 04/18/11 0845 04/18/11 0741 04/18/11 0702 04/18/11 0529  GLUCAP 287* 336* 506* >600*   Hemoglobin A1C: No results found for this basename: HGBA1C in the last 72 hours Fasting Lipid Panel: No results found for this basename: CHOL,HDL,LDLCALC,TRIG,CHOLHDL,LDLDIRECT in the last 72 hours Thyroid Function Tests: No results found for this basename: TSH,T4TOTAL,FREET4,T3FREE,THYROIDAB in the last 72 hours Anemia Panel: No results found for this basename: VITAMINB12,FOLATE,FERRITIN,TIBC,IRON,RETICCTPCT in the last 72 hours Coagulation: No results found for this basename: LABPROT:2,INR:2 in the last 72 hours Urine Drug Screen: Drugs of Abuse  No results found for this basename: labopia, cocainscrnur, labbenz, amphetmu, thcu, labbarb    Alcohol Level: No results  found for this basename: ETH:2 in the last 72 hours    No results found for this or any previous visit (from the past 240 hour(s)).   Results for orders placed during the hospital encounter of 04/18/11 (from the past 48 hour(s))  GLUCOSE, CAPILLARY     Status: Abnormal   Collection Time   04/18/11  5:29 AM      Component Value Range Comment   Glucose-Capillary >600 (*) 70 - 99 (mg/dL)   BASIC METABOLIC PANEL     Status: Abnormal   Collection Time   04/18/11  5:59 AM      Component Value Range Comment   Sodium 134 (*) 135 - 145 (mEq/L)    Potassium 4.1  3.5 - 5.1 (mEq/L)    Chloride 95 (*) 96 - 112 (mEq/L)    CO2 22  19 - 32 (mEq/L)    Glucose, Bld 620 (*) 70 - 99 (mg/dL)    BUN 29 (*) 6 - 23 (mg/dL)    Creatinine, Ser 1.61 (*) 0.50 - 1.35 (mg/dL)    Calcium 9.7  8.4 - 10.5 (  mg/dL)    GFR calc non Af Amer 58 (*) >90 (mL/min)    GFR calc Af Amer 67 (*) >90 (mL/min)   CBC     Status: Abnormal   Collection Time   04/18/11  5:59 AM      Component Value Range Comment   WBC 8.7  4.0 - 10.5 (K/uL)    RBC 5.36  4.22 - 5.81 (MIL/uL)    Hemoglobin 16.4  13.0 - 17.0 (g/dL)    HCT 16.1  09.6 - 04.5 (%)    MCV 89.9  78.0 - 100.0 (fL)    MCH 30.6  26.0 - 34.0 (pg)    MCHC 34.0  30.0 - 36.0 (g/dL)    RDW 40.9  81.1 - 91.4 (%)    Platelets 135 (*) 150 - 400 (K/uL)   DIFFERENTIAL     Status: Abnormal   Collection Time   04/18/11  5:59 AM      Component Value Range Comment   Neutrophils Relative 69  43 - 77 (%)    Neutro Abs 6.0  1.7 - 7.7 (K/uL)    Lymphocytes Relative 14  12 - 46 (%)    Lymphs Abs 1.3  0.7 - 4.0 (K/uL)    Monocytes Relative 17 (*) 3 - 12 (%)    Monocytes Absolute 1.4 (*) 0.1 - 1.0 (K/uL)    Eosinophils Relative 0  0 - 5 (%)    Eosinophils Absolute 0.0  0.0 - 0.7 (K/uL)    Basophils Relative 1  0 - 1 (%)    Basophils Absolute 0.0  0.0 - 0.1 (K/uL)   HEPATIC FUNCTION PANEL     Status: Normal   Collection Time   04/18/11  5:59 AM      Component Value Range Comment    Total Protein 7.2  6.0 - 8.3 (g/dL)    Albumin 3.8  3.5 - 5.2 (g/dL)    AST 23  0 - 37 (U/L)    ALT 32  0 - 53 (U/L)    Alkaline Phosphatase 60  39 - 117 (U/L)    Total Bilirubin 0.7  0.3 - 1.2 (mg/dL)    Bilirubin, Direct 0.2  0.0 - 0.3 (mg/dL)    Indirect Bilirubin 0.5  0.3 - 0.9 (mg/dL)   LIPASE, BLOOD     Status: Normal   Collection Time   04/18/11  5:59 AM      Component Value Range Comment   Lipase 11  11 - 59 (U/L)   BLOOD GAS, ARTERIAL     Status: Abnormal (Preliminary result)   Collection Time   04/18/11  6:02 AM      Component Value Range Comment   FIO2 21.00      Delivery systems ROOM AIR      pH, Arterial 7.350  7.350 - 7.450     pCO2 arterial 36.8  35.0 - 45.0 (mmHg)    pO2, Arterial 67.8 (*) 80.0 - 100.0 (mmHg)    Bicarbonate 19.8 (*) 20.0 - 24.0 (mEq/L)    TCO2 17.3  0 - 100 (mmol/L)    Acid-Base Excess 4.8 (*) 0.0 - 2.0 (mmol/L)    O2 Saturation 93.4      Oxygen index PENDING      Collection site RIGHT RADIAL      Drawn by [id number]      Sample type ARTERIAL      Allens test (pass/fail) PASS  PASS    GLUCOSE, CAPILLARY  Status: Abnormal   Collection Time   04/18/11  7:02 AM      Component Value Range Comment   Glucose-Capillary 506 (*) 70 - 99 (mg/dL)   GLUCOSE, CAPILLARY     Status: Abnormal   Collection Time   04/18/11  7:41 AM      Component Value Range Comment   Glucose-Capillary 336 (*) 70 - 99 (mg/dL)   BASIC METABOLIC PANEL     Status: Abnormal   Collection Time   04/18/11  8:25 AM      Component Value Range Comment   Sodium 139  135 - 145 (mEq/L)    Potassium 4.2  3.5 - 5.1 (mEq/L)    Chloride 107  96 - 112 (mEq/L) DELTA CHECK NOTED   CO2 23  19 - 32 (mEq/L)    Glucose, Bld 308 (*) 70 - 99 (mg/dL)    BUN 24 (*) 6 - 23 (mg/dL)    Creatinine, Ser 4.09  0.50 - 1.35 (mg/dL)    Calcium 8.2 (*) 8.4 - 10.5 (mg/dL)    GFR calc non Af Amer 76 (*) >90 (mL/min)    GFR calc Af Amer 88 (*) >90 (mL/min)   GLUCOSE, CAPILLARY     Status: Abnormal    Collection Time   04/18/11  8:45 AM      Component Value Range Comment   Glucose-Capillary 287 (*) 70 - 99 (mg/dL)     No results found.  Impression:  Principal Problem:  *Nausea and vomiting in adult Active Problems:  DKA, type 2  Hyponatremia  Acute renal failure  CKD (chronic kidney disease), stage II  Tachycardia  Thrombocytopenia  History of influenza  Tobacco abuse   1. Nausea and vomiting. The etiology is not certain, however, the patient could have a bowel gastroenteritis. He has had flulike symptoms and he is in mild DKA. These could also be contributing to his symptomatology. His liver transaminases and lipase are within normal limits. He already feels better following IV fluid hydration and a decrease in his capillary blood glucose.  2. Mild DKA in the setting of type 2 diabetes mellitus.Marland Kitchen His CO2 is within normal limits, however, his anion gap was elevated at 17. He was started on the insulin drip in the emergency department. He is status post 3 L of normal saline. His followup anion gap is now 9.  3. Recent flulike symptoms with bronchospasms. He is afebrile. His white blood cell count is within normal limits. Next  4. Hyponatremia, secondary to hypovolemia and hyperglycemia.  5. Tachycardia, secondary to hypovolemia and DKA.  6. Acute renal failure and chronic kidney disease, stage II. His baseline creatinine is approximately 1.25.  7. Tobacco abuse. He was advised to stop smoking.  8. Thrombocytopenia. His appears to be chronic dating back several years ago. His platelet count appears to range from 120 up to 150.  Plan:  1. Will discontinue the insulin drip now that his anion gap has closed. Will start sliding scale NovoLog, resistant scale every 4 hours. We'll add Lantus as well. Will restart his insulin pump settings tomorrow.  Continue IV fluid hydration. We'll monitor his electrolytes throughout the day.  We'll place the patient on droplet precautions.  We'll treat him symptomatically with Tessalon Perles, as needed Tussionex, Xopenex nebulizers, etc. We'll also start antiemetics as needed. Will start by giving him a clear liquid diet and advance as tolerated.  Tobacco cessation counseling.  For further evaluation, we'll order hemoglobin A1c, TSH, free T4,  and vitamin B 12.      Undray Allman 04/18/2011, 9:47 AM

## 2011-04-18 NOTE — ED Notes (Signed)
Patient's blood sugar 336-insulin drip adjusted to 2.26mL/hr per glucostabilizer.

## 2011-04-18 NOTE — ED Notes (Signed)
Pt resting comfortably.  CAOx4.  No needs noted.  NAD.

## 2011-04-18 NOTE — ED Notes (Signed)
Report given to Crystal, RN.

## 2011-04-18 NOTE — ED Notes (Signed)
Pt sick w. Flu like symptoms on wed, went to pmd given z-pack, has been vomiting since.

## 2011-04-18 NOTE — ED Provider Notes (Addendum)
History     CSN: [id number]  Arrival date & time 04/18/11  0518   First MD Initiated Contact with Patient 04/18/11 0550      Chief Complaint  Patient presents with  . Emesis  . Influenza    (Consider location/radiation/quality/duration/timing/severity/associated sxs/prior treatment) HPI Comments: [name] is a 40 y.o. male with a  H/o GERD, renal insufficiency, diabetes on an insulin pump who presents to the Emergency Department complaining of nausea and vomiting continuous ly for three days. He was seen by his PCP on Wednesday and begun on a Zpack. Has been vomiting since. Unable to keep down food or fluids. Has not monitored his glucose or adjusted his pump.Denies fever, chills. Illness has been accompanied by scratchy throat and dry cough.   PCP Dr.  Phillips Odor  Patient is a 40 y.o. male presenting with vomiting and flu symptoms.  Emesis   Influenza    Past Medical History  Diagnosis Date  . Diabetes mellitus   . Acid reflux   . Renal disorder     History reviewed. No pertinent past surgical history.  History reviewed. No pertinent family history.  History  Substance Use Topics  . Smoking status: Current Everyday Smoker    Types: Cigarettes  . Smokeless tobacco: Not on file  . Alcohol Use: No      Review of Systems  Gastrointestinal: Positive for vomiting.   10 Systems reviewed and are negative for acute change except as noted in the HPI. Allergies  Codeine and Glucophage  Home Medications   Current Outpatient Rx  Name Route Sig Dispense Refill  . INSULIN PUMP Subcutaneous Inject into the skin daily. NOVOLOG: Consistantly     . LISINOPRIL 5 MG PO TABS Oral Take 5 mg by mouth every morning.      Marland Kitchen PANTOPRAZOLE SODIUM 40 MG PO TBEC Oral Take 40 mg by mouth every morning.      Marland Kitchen SIMVASTATIN 5 MG PO TABS Oral Take 5 mg by mouth every morning.        BP 131/87  Pulse 121  Temp(Src) 97.8 F (36.6 C) (Oral)  Resp 20  Ht 5\' 8"  (1.727 m)  Wt 180 lb  (81.647 kg)  BMI 27.37 kg/m2  SpO2 94%  Physical Exam  Nursing note and vitals reviewed. Constitutional: He is oriented to person, place, and time. He appears well-developed and well-nourished.  HENT:  Head: Normocephalic and atraumatic.  Right Ear: External ear normal.  Left Ear: External ear normal.  Mouth/Throat: Oropharynx is clear and moist.  Eyes: Conjunctivae and EOM are normal.  Neck: Normal range of motion.  Cardiovascular: Normal heart sounds.        tachycardia  Pulmonary/Chest: Effort normal and breath sounds normal.  Abdominal: Soft. Bowel sounds are normal.  Musculoskeletal: Normal range of motion.  Neurological: He is alert and oriented to person, place, and time. He has normal reflexes.  Skin: Skin is warm and dry. He is not diaphoretic.    ED Course  Procedures (including critical care time)  Results for orders placed during the hospital encounter of 04/18/11  GLUCOSE, CAPILLARY      Component Value Range   Glucose-Capillary >600 (*) 70 - 99 (mg/dL)  BASIC METABOLIC PANEL      Component Value Range   Sodium 134 (*) 135 - 145 (mEq/L)   Potassium 4.1  3.5 - 5.1 (mEq/L)   Chloride 95 (*) 96 - 112 (mEq/L)   CO2 22  19 - 32 (mEq/L)  Glucose, Bld 620 (*) 70 - 99 (mg/dL)   BUN 29 (*) 6 - 23 (mg/dL)   Creatinine, Ser 4.54 (*) 0.50 - 1.35 (mg/dL)   Calcium 9.7  8.4 - 09.8 (mg/dL)   GFR calc non Af Amer 58 (*) >90 (mL/min)   GFR calc Af Amer 67 (*) >90 (mL/min)  CBC      Component Value Range   WBC 8.7  4.0 - 10.5 (K/uL)   RBC 5.36  4.22 - 5.81 (MIL/uL)   Hemoglobin 16.4  13.0 - 17.0 (g/dL)   HCT 11.9  14.7 - 82.9 (%)   MCV 89.9  78.0 - 100.0 (fL)   MCH 30.6  26.0 - 34.0 (pg)   MCHC 34.0  30.0 - 36.0 (g/dL)   RDW 56.2  13.0 - 86.5 (%)   Platelets 135 (*) 150 - 400 (K/uL)  DIFFERENTIAL      Component Value Range   Neutrophils Relative 69  43 - 77 (%)   Neutro Abs 6.0  1.7 - 7.7 (K/uL)   Lymphocytes Relative 14  12 - 46 (%)   Lymphs Abs 1.3  0.7 - 4.0  (K/uL)   Monocytes Relative 17 (*) 3 - 12 (%)   Monocytes Absolute 1.4 (*) 0.1 - 1.0 (K/uL)   Eosinophils Relative 0  0 - 5 (%)   Eosinophils Absolute 0.0  0.0 - 0.7 (K/uL)   Basophils Relative 1  0 - 1 (%)   Basophils Absolute 0.0  0.0 - 0.1 (K/uL)  BLOOD GAS, ARTERIAL      Component Value Range   FIO2 21.00     Delivery systems ROOM AIR     pH, Arterial 7.350  7.350 - 7.450    pCO2 arterial 36.8  35.0 - 45.0 (mmHg)   pO2, Arterial 67.8 (*) 80.0 - 100.0 (mmHg)   Bicarbonate 19.8 (*) 20.0 - 24.0 (mEq/L)   TCO2 17.3  0 - 100 (mmol/L)   Acid-Base Excess 4.8 (*) 0.0 - 2.0 (mmol/L)   O2 Saturation 93.4     Oxygen index PENDING     Collection site RIGHT RADIAL     Drawn by [id number]     Sample type ARTERIAL     Allens test (pass/fail) PASS  PASS   GLUCOSE, CAPILLARY      Component Value Range   Glucose-Capillary 506 (*) 70 - 99 (mg/dL)      MDM  Diabetic patient with three day h/o nausea and vomiting. Glucose 620. Began resuscitation with IVF, antiemetic,  placed on glucose stabilizer. Patient will be admitted to the hospital. 0728 Spoke with Dr. Sherrie Mustache, hospitalist who will admit the patient to stepdown.The patient appears reasonably stabilized for admission considering the current resources, flow, and capabilities available in the ED at this time, and I doubt any other [facility] requiring further screening and/or treatment in the ED prior to admission.  CRITICAL CARE Performed by: Annamarie Dawley.   Total critical care time: 50  Critical care time was exclusive of separately billable procedures and treating other patients.  Critical care was necessary to treat or prevent imminent or life-threatening deterioration.  Critical care was time spent personally by me on the following activities: development of treatment plan with patient and/or surrogate as well as nursing, discussions with consultants, evaluation of patient's response to treatment, examination of patient, obtaining history  from patient or surrogate, ordering and performing treatments and interventions, ordering and review of laboratory studies, ordering and review of radiographic studies, pulse oximetry and re-evaluation  of patient's condition.      Nicoletta Dress. Colon Branch, MD 04/18/11 [id number]  Nicoletta Dress. Colon Branch, MD 04/18/11 [phone]

## 2011-04-18 NOTE — ED Notes (Signed)
CBG glucometer results = 506

## 2011-04-18 NOTE — ED Notes (Signed)
CRITICAL VALUE ALERT  Critical value received:  Glucose 620  Date of notification:  04/18/11  Time of notification:  0645  Critical value read back:yes  Nurse who received alert:  P.Larene Ascencio,rn  MD notified (1st page):  strand  Time of first page:   MD notified (2nd page):  Time of second page:  Responding MD:  strand  Time MD responded:  [phone]

## 2011-04-19 ENCOUNTER — Encounter (HOSPITAL_COMMUNITY): Payer: Self-pay | Admitting: Internal Medicine

## 2011-04-19 DIAGNOSIS — E538 Deficiency of other specified B group vitamins: Secondary | ICD-10-CM

## 2011-04-19 DIAGNOSIS — E162 Hypoglycemia, unspecified: Secondary | ICD-10-CM | POA: Diagnosis not present

## 2011-04-19 HISTORY — DX: Deficiency of other specified B group vitamins: E53.8

## 2011-04-19 LAB — COMPREHENSIVE METABOLIC PANEL
BUN: 17 mg/dL (ref 6–23)
CO2: 24 mEq/L (ref 19–32)
Calcium: 8.6 mg/dL (ref 8.4–10.5)
Creatinine, Ser: 1.11 mg/dL (ref 0.50–1.35)
GFR calc Af Amer: >90 mL/min (ref >90)
GFR calc non Af Amer: 80 mL/min — ABNORMAL LOW (ref >90)
Glucose, Bld: 128 mg/dL — ABNORMAL HIGH (ref 70–99)
Total Protein: 5.9 g/dL — ABNORMAL LOW (ref 6.0–8.3)

## 2011-04-19 LAB — CBC
HCT: 39.8 % (ref 39.0–52.0)
Hemoglobin: 13.9 g/dL (ref 13.0–17.0)
MCHC: 34.9 g/dL (ref 30.0–36.0)
RBC: 4.45 MIL/uL (ref 4.22–5.81)
WBC: 7.8 10*3/uL (ref 4.0–10.5)

## 2011-04-19 MED ORDER — INSULIN ASPART 100 UNIT/ML ~~LOC~~ SOLN: 0.0000 [IU] | Freq: Every day | SUBCUTANEOUS | Status: DC

## 2011-04-19 MED ORDER — GLUCOSE 40 % PO GEL: 1.0000 | ORAL | Status: DC | PRN

## 2011-04-19 MED ORDER — GLUCOSE-VITAMIN C 4-6 GM-MG PO CHEW: 4.0000 | CHEWABLE_TABLET | ORAL | Status: DC | PRN

## 2011-04-19 MED ORDER — DEXTROSE 50 % IV SOLN: 25.0000 mL | Freq: Once | INTRAVENOUS | Status: DC | PRN

## 2011-04-19 MED ORDER — INSULIN ASPART 100 UNIT/ML ~~LOC~~ SOLN: 0.0000 [IU] | Freq: Three times a day (TID) | SUBCUTANEOUS | Status: DC

## 2011-04-19 MED ORDER — VITAMIN B-12 1000 MCG PO TABS: 1000.0000 ug | ORAL_TABLET | Freq: Every day | ORAL | Status: AC

## 2011-04-19 MED ORDER — BENZONATATE 100 MG PO CAPS: 100.0000 mg | ORAL_CAPSULE | Freq: Three times a day (TID) | ORAL | Status: AC | PRN

## 2011-04-19 MED ORDER — ALBUTEROL SULFATE HFA 108 (90 BASE) MCG/ACT IN AERS: 2.0000 | INHALATION_SPRAY | Freq: Four times a day (QID) | RESPIRATORY_TRACT | Status: DC | PRN

## 2011-04-19 MED ORDER — DEXTROSE 50 % IV SOLN: 50.0000 mL | Freq: Once | INTRAVENOUS | Status: DC | PRN

## 2011-04-19 MED ORDER — CYANOCOBALAMIN 1000 MCG/ML IJ SOLN
1000.0000 ug | Freq: Once | INTRAMUSCULAR | Status: AC
  Administered 2011-04-19: 1000 ug via INTRAMUSCULAR
  Filled 2011-04-19: qty 1

## 2011-04-19 NOTE — Progress Notes (Signed)
Patient refuses to eat any of the foods in his diet. Patient's blood sugar was 30. Patient was asymptomatic. Patient was given orange juice. Doctor was notified. Patient was put on hypoglycemic protocol. BS rechecked was 73. Since meals won't be up in the next hour. Half a cup of coke was given to drink. Will continue to monitor

## 2011-04-19 NOTE — Progress Notes (Signed)
CRITICAL VALUE ALERT  Critical value received: Blood sugar 30  Date of notification: 04/19/11  Time of notification: 0500  Critical value read back:yes  Nurse who received alert:  Gailen Shelter  MD notified (1st page):  Rito Ehrlich  Time of first page:  0501  MD notified (2nd page):  Time of second page:  Responding MD:  Rito Ehrlich  Time MD responded:  [phone]

## 2011-04-19 NOTE — Progress Notes (Signed)
PIV removed without complaint, patient discharged home. Patient verbalizes understanding of discharge instructions, prescriptions and follow up care. Patient escorted out by staff, transported by family. 

## 2011-04-19 NOTE — Discharge Summary (Signed)
Physician Discharge Summary  [name] MRN: [id number] DOB/AGE: [birth year] 40 y.o.  PCP: Colette Ribas, MD, MD   Admit date: 04/18/2011 Discharge date: 04/19/2011  Discharge Diagnoses:  1. Mild DKA in the setting of type II insulin-dependent diabetes mellitus. 2. Nausea and vomiting, likely secondary to viral syndrome. 3. Acute bronchitis with previous symptomatology consistent with influenza. 4. Asymptomatic hypoglycemia. 5. Acute renal failure, resolved with hydration. 6. Stage II chronic kidney disease. His creatinine was 1.09 prior to discharge. 7. Sinus tachycardia, resolved with IV fluid hydration. 8. Hyponatremia, secondary to volume depletion and hyperglycemia. Resolved. 9. Tobacco abuse. 10. Thrombocytopenia, possibly secondary to vitamin B12 deficiency. 11. Vitamin B 12 deficiency, new diagnosis.    Discharge Medication List as of 04/19/2011 12:57 PM    START taking these medications   Details  albuterol (PROVENTIL HFA;VENTOLIN HFA) 108 (90 BASE) MCG/ACT inhaler Inhale 2 puffs into the lungs every 6 (six) hours as needed for wheezing or shortness of breath., Starting 04/19/2011, Until Sun 04/18/12, Print    benzonatate (TESSALON) 100 MG capsule Take 1 capsule (100 mg total) by mouth 3 (three) times daily as needed for cough., Starting 04/19/2011, Until Sat 04/26/11, Print    vitamin B-12 (CYANOCOBALAMIN) 1000 MCG tablet Take 1 tablet (1,000 mcg total) by mouth daily., Starting 04/19/2011, Until Sun 04/18/12, OTC      CONTINUE these medications which have NOT CHANGED   Details  Insulin Human (INSULIN PUMP) 100 unit/ml SOLN Inject into the skin daily. NOVOLOG: Consistantly , Until Discontinued, Historical Med    lisinopril (PRINIVIL,ZESTRIL) 5 MG tablet Take 5 mg by mouth every morning.  , Until Discontinued, Historical Med    pantoprazole (PROTONIX) 40 MG tablet Take 40 mg by mouth every morning.  , Until Discontinued, Historical Med    simvastatin  (ZOCOR) 5 MG tablet Take 5 mg by mouth every morning. , Until Discontinued, Historical Med        Discharge Condition: Stable and improved.  Disposition: Home or Self Care   Consults: None   Significant Diagnostic Studies:    Microbiology: No results found for this or any previous visit (from the past 240 hour(s)).   Labs: Results for orders placed during the hospital encounter of 04/18/11 (from the past 48 hour(s))  GLUCOSE, CAPILLARY     Status: Abnormal   Collection Time   04/18/11  5:29 AM      Component Value Range Comment   Glucose-Capillary >600 (*) 70 - 99 (mg/dL)   BASIC METABOLIC PANEL     Status: Abnormal   Collection Time   04/18/11  5:59 AM      Component Value Range Comment   Sodium 134 (*) 135 - 145 (mEq/L)    Potassium 4.1  3.5 - 5.1 (mEq/L)    Chloride 95 (*) 96 - 112 (mEq/L)    CO2 22  19 - 32 (mEq/L)    Glucose, Bld 620 (*) 70 - 99 (mg/dL)    BUN 29 (*) 6 - 23 (mg/dL)    Creatinine, Ser 4.09 (*) 0.50 - 1.35 (mg/dL)    Calcium 9.7  8.4 - 10.5 (mg/dL)    GFR calc non Af Amer 58 (*) >90 (mL/min)    GFR calc Af Amer 67 (*) >90 (mL/min)   CBC     Status: Abnormal   Collection Time   04/18/11  5:59 AM      Component Value Range Comment   WBC 8.7  4.0 - 10.5 (K/uL)  RBC 5.36  4.22 - 5.81 (MIL/uL)    Hemoglobin 16.4  13.0 - 17.0 (g/dL)    HCT 14.7  82.9 - 56.2 (%)    MCV 89.9  78.0 - 100.0 (fL)    MCH 30.6  26.0 - 34.0 (pg)    MCHC 34.0  30.0 - 36.0 (g/dL)    RDW 13.0  86.5 - 78.4 (%)    Platelets 135 (*) 150 - 400 (K/uL)   DIFFERENTIAL     Status: Abnormal   Collection Time   04/18/11  5:59 AM      Component Value Range Comment   Neutrophils Relative 69  43 - 77 (%)    Neutro Abs 6.0  1.7 - 7.7 (K/uL)    Lymphocytes Relative 14  12 - 46 (%)    Lymphs Abs 1.3  0.7 - 4.0 (K/uL)    Monocytes Relative 17 (*) 3 - 12 (%)    Monocytes Absolute 1.4 (*) 0.1 - 1.0 (K/uL)    Eosinophils Relative 0  0 - 5 (%)    Eosinophils Absolute 0.0  0.0 - 0.7  (K/uL)    Basophils Relative 1  0 - 1 (%)    Basophils Absolute 0.0  0.0 - 0.1 (K/uL)   HEPATIC FUNCTION PANEL     Status: Normal   Collection Time   04/18/11  5:59 AM      Component Value Range Comment   Total Protein 7.2  6.0 - 8.3 (g/dL)    Albumin 3.8  3.5 - 5.2 (g/dL)    AST 23  0 - 37 (U/L)    ALT 32  0 - 53 (U/L)    Alkaline Phosphatase 60  39 - 117 (U/L)    Total Bilirubin 0.7  0.3 - 1.2 (mg/dL)    Bilirubin, Direct 0.2  0.0 - 0.3 (mg/dL)    Indirect Bilirubin 0.5  0.3 - 0.9 (mg/dL)   LIPASE, BLOOD     Status: Normal   Collection Time   04/18/11  5:59 AM      Component Value Range Comment   Lipase 11  11 - 59 (U/L)   HEMOGLOBIN A1C     Status: Abnormal   Collection Time   04/18/11  5:59 AM      Component Value Range Comment   Hemoglobin A1C 8.1 (*) <5.7 (%)    Mean Plasma Glucose 186 (*) <117 (mg/dL)   VITAMIN O96     Status: Abnormal   Collection Time   04/18/11  5:59 AM      Component Value Range Comment   Vitamin B-12 175 (*) 211 - 911 (pg/mL)   TSH     Status: Normal   Collection Time   04/18/11  5:59 AM      Component Value Range Comment   TSH 1.071  0.350 - 4.500 (uIU/mL)   T4, FREE     Status: Normal   Collection Time   04/18/11  5:59 AM      Component Value Range Comment   Free T4 1.12  0.80 - 1.80 (ng/dL)   BLOOD GAS, ARTERIAL     Status: Abnormal (Preliminary result)   Collection Time   04/18/11  6:02 AM      Component Value Range Comment   FIO2 21.00      Delivery systems ROOM AIR      pH, Arterial 7.350  7.350 - 7.450     pCO2 arterial 36.8  35.0 - 45.0 (mmHg)  pO2, Arterial 67.8 (*) 80.0 - 100.0 (mmHg)    Bicarbonate 19.8 (*) 20.0 - 24.0 (mEq/L)    TCO2 17.3  0 - 100 (mmol/L)    Acid-Base Excess 4.8 (*) 0.0 - 2.0 (mmol/L)    O2 Saturation 93.4      Oxygen index PENDING      Collection site RIGHT RADIAL      Drawn by [id number]      Sample type ARTERIAL      Allens test (pass/fail) PASS  PASS    GLUCOSE, CAPILLARY     Status: Abnormal    Collection Time   04/18/11  7:02 AM      Component Value Range Comment   Glucose-Capillary 506 (*) 70 - 99 (mg/dL)   GLUCOSE, CAPILLARY     Status: Abnormal   Collection Time   04/18/11  7:41 AM      Component Value Range Comment   Glucose-Capillary 336 (*) 70 - 99 (mg/dL)   BASIC METABOLIC PANEL     Status: Abnormal   Collection Time   04/18/11  8:25 AM      Component Value Range Comment   Sodium 139  135 - 145 (mEq/L)    Potassium 4.2  3.5 - 5.1 (mEq/L)    Chloride 107  96 - 112 (mEq/L) DELTA CHECK NOTED   CO2 23  19 - 32 (mEq/L)    Glucose, Bld 308 (*) 70 - 99 (mg/dL)    BUN 24 (*) 6 - 23 (mg/dL)    Creatinine, Ser 0.45  0.50 - 1.35 (mg/dL)    Calcium 8.2 (*) 8.4 - 10.5 (mg/dL)    GFR calc non Af Amer 76 (*) >90 (mL/min)    GFR calc Af Amer 88 (*) >90 (mL/min)   GLUCOSE, CAPILLARY     Status: Abnormal   Collection Time   04/18/11  8:45 AM      Component Value Range Comment   Glucose-Capillary 287 (*) 70 - 99 (mg/dL)   GLUCOSE, CAPILLARY     Status: Abnormal   Collection Time   04/18/11 10:05 AM      Component Value Range Comment   Glucose-Capillary 184 (*) 70 - 99 (mg/dL)   GLUCOSE, CAPILLARY     Status: Abnormal   Collection Time   04/18/11 11:45 AM      Component Value Range Comment   Glucose-Capillary 232 (*) 70 - 99 (mg/dL)    Comment 1 Documented in Chart      Comment 2 Notify RN     GLUCOSE, CAPILLARY     Status: Abnormal   Collection Time   04/18/11 12:59 PM      Component Value Range Comment   Glucose-Capillary 197 (*) 70 - 99 (mg/dL)    Comment 1 Notify RN      Comment 2 Documented in Chart     BASIC METABOLIC PANEL     Status: Abnormal   Collection Time   04/18/11  2:55 PM      Component Value Range Comment   Sodium 138  135 - 145 (mEq/L)    Potassium 4.5  3.5 - 5.1 (mEq/L)    Chloride 105  96 - 112 (mEq/L)    CO2 22  19 - 32 (mEq/L)    Glucose, Bld 308 (*) 70 - 99 (mg/dL)    BUN 21  6 - 23 (mg/dL)    Creatinine, Ser 4.09  0.50 - 1.35 (mg/dL)     Calcium 8.5  8.4 -  10.5 (mg/dL)    GFR calc non Af Amer 82 (*) >90 (mL/min)    GFR calc Af Amer >90  >90 (mL/min)   GLUCOSE, CAPILLARY     Status: Abnormal   Collection Time   04/18/11  4:57 PM      Component Value Range Comment   Glucose-Capillary 414 (*) 70 - 99 (mg/dL)    Comment 1 Documented in Chart      Comment 2 Notify RN     GLUCOSE, CAPILLARY     Status: Abnormal   Collection Time   04/18/11  6:20 PM      Component Value Range Comment   Glucose-Capillary 364 (*) 70 - 99 (mg/dL)    Comment 1 Documented in Chart      Comment 2 Notify RN     GLUCOSE, CAPILLARY     Status: Abnormal   Collection Time   04/18/11  8:09 PM      Component Value Range Comment   Glucose-Capillary 253 (*) 70 - 99 (mg/dL)   GLUCOSE, CAPILLARY     Status: Abnormal   Collection Time   04/18/11 11:33 PM      Component Value Range Comment   Glucose-Capillary 115 (*) 70 - 99 (mg/dL)   COMPREHENSIVE METABOLIC PANEL     Status: Abnormal   Collection Time   04/19/11  6:31 AM      Component Value Range Comment   Sodium 139  135 - 145 (mEq/L)    Potassium 4.2  3.5 - 5.1 (mEq/L)    Chloride 106  96 - 112 (mEq/L)    CO2 24  19 - 32 (mEq/L)    Glucose, Bld 128 (*) 70 - 99 (mg/dL)    BUN 17  6 - 23 (mg/dL)    Creatinine, Ser 1.61  0.50 - 1.35 (mg/dL)    Calcium 8.6  8.4 - 10.5 (mg/dL)    Total Protein 5.9 (*) 6.0 - 8.3 (g/dL)    Albumin 2.9 (*) 3.5 - 5.2 (g/dL)    AST 22  0 - 37 (U/L)    ALT 23  0 - 53 (U/L)    Alkaline Phosphatase 40  39 - 117 (U/L)    Total Bilirubin 0.4  0.3 - 1.2 (mg/dL)    GFR calc non Af Amer 80 (*) >90 (mL/min)    GFR calc Af Amer >90  >90 (mL/min)   CBC     Status: Abnormal   Collection Time   04/19/11  6:31 AM      Component Value Range Comment   WBC 7.8  4.0 - 10.5 (K/uL)    RBC 4.45  4.22 - 5.81 (MIL/uL)    Hemoglobin 13.9  13.0 - 17.0 (g/dL)    HCT 09.6  04.5 - 40.9 (%)    MCV 89.4  78.0 - 100.0 (fL)    MCH 31.2  26.0 - 34.0 (pg)    MCHC 34.9  30.0 - 36.0 (g/dL)     RDW 81.1  91.4 - 78.2 (%)    Platelets 111 (*) 150 - 400 (K/uL)      HPI : The patient is a 40 year old man with a past medical history significant for insulin-dependent, type 2 diabetes mellitus, who presented to the emergency department on 04/18/2011 with a chief complaint of nausea, vomiting, and recent flulike symptoms. He admitted not checking his blood sugars and not adjusting his insulin pump appropriately because he had not been eating much at home. In the emergency department,  he was noted to be initially tachycardic with a heart rate in the 130s. Following 3 L of IV fluids, his heart rate normalized. His blood pressure was within normal limits. His lab data were significant for a serum sodium of 134, chloride of 95, CO2 of 22, BUN of 29, creatinine of 1.45, and glucose of 620. His platelet count was 135 and his WBC was within normal limits at 8.7. His ABG on room air revealed a pH of 7.35, PCO2 of 37, and PO2 of 68. His lipase and liver transaminases were within normal limits. He was admitted for further evaluation and management.  HOSPITAL COURSE: The patient was started on aggressive IV fluids and an insulin drip in the emergency department. His insulin pump was taken off. Although his CO2 was within normal limits, his anion gap was elevated at 17. Following the initial treatment in the emergency department, his anion gap closed to 9. The insulin drip was therefore discontinued. He was started on every 4 hours NovoLog sliding scale and Lantus. He was given supportive treatment with as needed antiemetics. Proton pump inhibitor therapy with Protonix was given intravenously. He was continued on IV fluid for hydration  to treat hypovolemic hyponatremia, acute renal insufficiency, and DKA. He was started on Xopenex nebulizers and cough suppressants for flulike symptoms and bronchospasms. He was advised to stop smoking. He refused a nicotine patch. Tobacco cessation counseling was ordered.  The  patient was noted to be thrombocytopenic. Apparently he had been thrombocytopenic for at least a couple of years based on the review of his old records. A vitamin B 12 level and TSH/free T4 were ordered. His vitamin B 12 level was low. His thyroid function was within normal limits. He was given 1 IM injection (1000 mcg) prior to discharge when the results became available. He was instructed to discuss further IM supplementation with his primary care physician Dr. Phillips Odor. He was also instructed to take oral vitamin B12 supplements daily. He voiced understanding.  Over the course of the hospitalization, the patient's symptomatology, electrolytes and renal function improved. He did have one asymptomatic episode of hypoglycemia that was treated accordingly and appropriately. His insulin pump was restarted prior to discharge.     Discharge Exam: Blood pressure 117/79, pulse 87, temperature 97.7 F (36.5 C), temperature source Oral, resp. rate 20, height 5\' 8"  (1.727 m), weight 81.1 kg (178 lb 12.7 oz), SpO2 94.00%. Lungs: Clear to consultation bilaterally. Heart: S1, S2, with no murmurs rubs or gallops. Abdomen: Positive bowel sounds, soft, nontender, nondistended. Extremities: No pedal edema.    Discharge Orders    Future Orders Please Complete By Expires   Diet - low sodium heart healthy      Increase activity slowly      Discharge instructions      Comments:   NO SMOKING. CHECK YOUR BLOOD SUGARS WITHOUT SKIPPING. FOLLOW UP WITH YOUR DOCTOR FOR VITAMIN B12 INJECTIONS.      Follow-up Information    Follow up with Colette Ribas, MD in 1 week. (CALL FOR THE APPOINTMENT.)          Discharge time: 35 minutes.   Signed: Teyona Nichelson 04/19/2011, 1:11 PM

## 2011-04-23 LAB — GLUCOSE, CAPILLARY

## 2011-04-24 LAB — GLUCOSE, CAPILLARY: Glucose-Capillary: 27 mg/dL — CL (ref 70–99)

## 2012-02-02 LAB — BLOOD GAS, ARTERIAL
Bicarbonate: 19.8 mEq/L — ABNORMAL LOW (ref 20.0–24.0)
TCO2: 17.3 mmol/L (ref 0–100)
pH, Arterial: 7.35 (ref 7.350–7.450)
pO2, Arterial: 67.8 mmHg — ABNORMAL LOW (ref 80.0–100.0)

## 2012-02-04 ENCOUNTER — Encounter (HOSPITAL_COMMUNITY): Payer: Self-pay

## 2012-02-04 ENCOUNTER — Emergency Department (HOSPITAL_COMMUNITY): Payer: Managed Care, Other (non HMO)

## 2012-02-04 ENCOUNTER — Inpatient Hospital Stay (HOSPITAL_COMMUNITY)
Admission: EM | Admit: 2012-02-04 | Discharge: 2012-02-06 | DRG: 203 | Disposition: A | Discharge status: Home / Self Care | Payer: Managed Care, Other (non HMO) | Attending: Internal Medicine | Admitting: Internal Medicine

## 2012-02-04 DIAGNOSIS — E538 Deficiency of other specified B group vitamins: Secondary | ICD-10-CM

## 2012-02-04 DIAGNOSIS — D696 Thrombocytopenia, unspecified: Secondary | ICD-10-CM

## 2012-02-04 DIAGNOSIS — Z79899 Other long term (current) drug therapy: Secondary | ICD-10-CM

## 2012-02-04 DIAGNOSIS — I951 Orthostatic hypotension: Secondary | ICD-10-CM | POA: Diagnosis present

## 2012-02-04 DIAGNOSIS — E785 Hyperlipidemia, unspecified: Secondary | ICD-10-CM | POA: Diagnosis present

## 2012-02-04 DIAGNOSIS — N179 Acute kidney failure, unspecified: Secondary | ICD-10-CM

## 2012-02-04 DIAGNOSIS — K219 Gastro-esophageal reflux disease without esophagitis: Secondary | ICD-10-CM | POA: Diagnosis present

## 2012-02-04 DIAGNOSIS — N182 Chronic kidney disease, stage 2 (mild): Secondary | ICD-10-CM

## 2012-02-04 DIAGNOSIS — E860 Dehydration: Secondary | ICD-10-CM | POA: Diagnosis present

## 2012-02-04 DIAGNOSIS — R062 Wheezing: Secondary | ICD-10-CM | POA: Diagnosis present

## 2012-02-04 DIAGNOSIS — Z888 Allergy status to other drugs, medicaments and biological substances status: Secondary | ICD-10-CM

## 2012-02-04 DIAGNOSIS — J209 Acute bronchitis, unspecified: Principal | ICD-10-CM | POA: Diagnosis present

## 2012-02-04 DIAGNOSIS — Z8709 Personal history of other diseases of the respiratory system: Secondary | ICD-10-CM

## 2012-02-04 DIAGNOSIS — E669 Obesity, unspecified: Secondary | ICD-10-CM | POA: Diagnosis present

## 2012-02-04 DIAGNOSIS — E1110 Type 2 diabetes mellitus with ketoacidosis without coma: Secondary | ICD-10-CM

## 2012-02-04 DIAGNOSIS — J40 Bronchitis, not specified as acute or chronic: Secondary | ICD-10-CM

## 2012-02-04 DIAGNOSIS — R000 Tachycardia, unspecified: Secondary | ICD-10-CM | POA: Diagnosis present

## 2012-02-04 DIAGNOSIS — H918X9 Other specified hearing loss, unspecified ear: Secondary | ICD-10-CM | POA: Diagnosis present

## 2012-02-04 DIAGNOSIS — I129 Hypertensive chronic kidney disease with stage 1 through stage 4 chronic kidney disease, or unspecified chronic kidney disease: Secondary | ICD-10-CM | POA: Diagnosis present

## 2012-02-04 DIAGNOSIS — F17200 Nicotine dependence, unspecified, uncomplicated: Secondary | ICD-10-CM | POA: Diagnosis present

## 2012-02-04 DIAGNOSIS — Z720 Tobacco use: Secondary | ICD-10-CM | POA: Diagnosis present

## 2012-02-04 DIAGNOSIS — E162 Hypoglycemia, unspecified: Secondary | ICD-10-CM

## 2012-02-04 DIAGNOSIS — E119 Type 2 diabetes mellitus without complications: Secondary | ICD-10-CM | POA: Diagnosis present

## 2012-02-04 DIAGNOSIS — Z9641 Presence of insulin pump (external) (internal): Secondary | ICD-10-CM

## 2012-02-04 DIAGNOSIS — Z6832 Body mass index (BMI) 32.0-32.9, adult: Secondary | ICD-10-CM

## 2012-02-04 DIAGNOSIS — Z885 Allergy status to narcotic agent status: Secondary | ICD-10-CM

## 2012-02-04 DIAGNOSIS — Z792 Long term (current) use of antibiotics: Secondary | ICD-10-CM

## 2012-02-04 DIAGNOSIS — Z794 Long term (current) use of insulin: Secondary | ICD-10-CM

## 2012-02-04 DIAGNOSIS — R112 Nausea with vomiting, unspecified: Secondary | ICD-10-CM | POA: Diagnosis present

## 2012-02-04 DIAGNOSIS — IMO0002 Reserved for concepts with insufficient information to code with codable children: Secondary | ICD-10-CM

## 2012-02-04 DIAGNOSIS — E871 Hypo-osmolality and hyponatremia: Secondary | ICD-10-CM

## 2012-02-04 LAB — COMPREHENSIVE METABOLIC PANEL
AST: 14 U/L (ref 0–37)
Albumin: 3.8 g/dL (ref 3.5–5.2)
BUN: 17 mg/dL (ref 6–23)
Calcium: 9.7 mg/dL (ref 8.4–10.5)
Creatinine, Ser: 1.26 mg/dL (ref 0.50–1.35)
Total Protein: 7 g/dL (ref 6.0–8.3)

## 2012-02-04 LAB — BLOOD GAS, ARTERIAL
Acid-Base Excess: 1 mmol/L (ref 0.0–2.0)
Bicarbonate: 24.2 mEq/L — ABNORMAL HIGH (ref 20.0–24.0)
Drawn by: 21694
O2 Content: 21 L/min
O2 Saturation: 92 %
pCO2 arterial: 33.1 mmHg — ABNORMAL LOW (ref 35.0–45.0)
pO2, Arterial: 56.6 mmHg — ABNORMAL LOW (ref 80.0–100.0)

## 2012-02-04 LAB — URINALYSIS, ROUTINE W REFLEX MICROSCOPIC
Glucose, UA: 100 mg/dL — AB
Hgb urine dipstick: NEGATIVE
Protein, ur: 30 mg/dL — AB
Urobilinogen, UA: 0.2 mg/dL (ref 0.0–1.0)

## 2012-02-04 LAB — CBC WITH DIFFERENTIAL/PLATELET
Basophils Absolute: 0.1 10*3/uL (ref 0.0–0.1)
Basophils Relative: 0 % (ref 0–1)
Eosinophils Absolute: 0 10*3/uL (ref 0.0–0.7)
Eosinophils Relative: 0 % (ref 0–5)
HCT: 44.4 % (ref 39.0–52.0)
MCH: 30.8 pg (ref 26.0–34.0)
MCHC: 34.9 g/dL (ref 30.0–36.0)
Monocytes Absolute: 0.8 10*3/uL (ref 0.1–1.0)
Neutro Abs: 11.3 10*3/uL — ABNORMAL HIGH (ref 1.7–7.7)
RDW: 12.3 % (ref 11.5–15.5)

## 2012-02-04 LAB — GLUCOSE, CAPILLARY
Glucose-Capillary: 195 mg/dL — ABNORMAL HIGH (ref 70–99)
Glucose-Capillary: 202 mg/dL — ABNORMAL HIGH (ref 70–99)

## 2012-02-04 LAB — LIPASE, BLOOD: Lipase: 20 U/L (ref 11–59)

## 2012-02-04 LAB — URINE MICROSCOPIC-ADD ON

## 2012-02-04 LAB — RAPID STREP SCREEN (MED CTR MEBANE ONLY): Streptococcus, Group A Screen (Direct): NEGATIVE

## 2012-02-04 MED ORDER — SODIUM CHLORIDE 0.9 % IV SOLN
INTRAVENOUS | Status: DC
  Administered 2012-02-04: 20:00:00 via INTRAVENOUS

## 2012-02-04 MED ORDER — SODIUM CHLORIDE 0.9 % IV BOLUS (SEPSIS)
500.0000 mL | Freq: Once | INTRAVENOUS | Status: AC
  Administered 2012-02-05: 500 mL via INTRAVENOUS

## 2012-02-04 MED ORDER — ONDANSETRON HCL 4 MG/2ML IJ SOLN
4.0000 mg | INTRAMUSCULAR | Status: DC | PRN
  Administered 2012-02-04: 4 mg via INTRAVENOUS
  Filled 2012-02-04: qty 2

## 2012-02-04 MED ORDER — SODIUM CHLORIDE 0.9 % IV SOLN: INTRAVENOUS | Status: DC

## 2012-02-04 MED ORDER — IPRATROPIUM BROMIDE 0.02 % IN SOLN
0.5000 mg | Freq: Once | RESPIRATORY_TRACT | Status: AC
  Administered 2012-02-04: 0.5 mg via RESPIRATORY_TRACT
  Filled 2012-02-04: qty 2.5

## 2012-02-04 MED ORDER — SODIUM CHLORIDE 0.9 % IV BOLUS (SEPSIS)
1000.0000 mL | Freq: Once | INTRAVENOUS | Status: AC
  Administered 2012-02-04: 1000 mL via INTRAVENOUS

## 2012-02-04 MED ORDER — ALBUTEROL SULFATE (5 MG/ML) 0.5% IN NEBU
5.0000 mg | INHALATION_SOLUTION | Freq: Once | RESPIRATORY_TRACT | Status: AC
  Administered 2012-02-04: 5 mg via RESPIRATORY_TRACT
  Filled 2012-02-04: qty 1

## 2012-02-04 MED ORDER — SODIUM CHLORIDE 0.9 % IV BOLUS (SEPSIS)
500.0000 mL | Freq: Once | INTRAVENOUS | Status: AC
  Administered 2012-02-04: 500 mL via INTRAVENOUS

## 2012-02-04 NOTE — ED Notes (Signed)
Patient with insulin pump. Patient personally entered glucose checked at 1845 (cbg 195). Patient states insulin pump gave patient 0.4 units of insulin.

## 2012-02-04 NOTE — H&P (Signed)
[name] is an 41 y.o. male.    PCP: Colette Ribas, MD   Chief Complaint: Cough with nausea, vomiting  HPI: This is a 41 year old, Caucasian male, with a past medical history of, diabetes, who has an insulin pump, hypercholesterolemia, vitamin B12 deficiency, who was in his usual state of health till yesterday afternoon when he started having a cough. He also had a difficult time breathing with wheezes. The cough is predominantly dry. He felt hot all over, but did not check his temperature. He felt cold at times as well. He says, that one of his colleagues at work had a cough on Monday. Patient denies any headaches. No chest pains. And, as a result of the severe, cough he's been having nausea and vomiting. The cough is also causing his upper abdomen to hurt. He also was concerned, that his blood sugars were high at home, but is unable to me the exact values. He went to his doctor's office this morning and was prescribed Z-Pak, inhalers, albuterol tablets. He was offered steroids, however patient declined because steroids cause his blood sugars to go high. Patient denies any skin rashes. Denies any joint pains.   Home Medications: Prior to Admission medications   Medication Sig Start Date End Date Taking? Authorizing Provider  albuterol (PROAIR HFA) 108 (90 BASE) MCG/ACT inhaler Inhale 2 puffs into the lungs every 4 (four) hours as needed. For shortness of breath   Yes Historical Provider, MD  albuterol (PROVENTIL) 2 MG tablet Take 2 mg by mouth 3 (three) times daily. 02/04/12  Yes Historical Provider, MD  azithromycin (ZITHROMAX Z-PAK) 250 MG tablet Take 250-500 mg by mouth as directed. *Take two tablets on day one, then take one tablet daily for 4 days* 02/04/12  Yes Historical Provider, MD  CYANOCOBALAMIN IJ Inject 1 mL as directed every 30 (thirty) days. B-12 Injection   Yes Historical Provider, MD  Insulin Human (INSULIN PUMP) 100 unit/ml SOLN Inject into the skin daily. NOVOLOG:  Consistantly    Yes Historical Provider, MD  lisinopril (PRINIVIL,ZESTRIL) 5 MG tablet Take 5 mg by mouth every morning.    Yes Historical Provider, MD  pantoprazole (PROTONIX) 40 MG tablet Take 40 mg by mouth every morning.    Yes Historical Provider, MD  simvastatin (ZOCOR) 5 MG tablet Take 5 mg by mouth every morning.    Yes Historical Provider, MD  vitamin B-12 (CYANOCOBALAMIN) 1000 MCG tablet Take 1 tablet (1,000 mcg total) by mouth daily. 04/19/11 04/18/12 Yes Elliot Cousin, MD    Allergies:  Allergies  Allergen Reactions  . Codeine Nausea Only    Drowsiness  . Glucophage (Metformin Hydrochloride) Other (See Comments)    Vomiting and cramps    Past Medical History: Past Medical History  Diagnosis Date  . DM2 (diabetes mellitus, type 2)   . Acid reflux   . CKD (chronic kidney disease), stage II   . Hyperlipidemia   . Thrombocytopenia   . HTN (hypertension)   . History of influenza 04/18/2011  . Tobacco abuse 04/18/2011  . Deafness     Left ear only. Congenital.  . Diabetes mellitus   . Vitamin B12 deficiency 04/19/2011    Past Surgical History  Procedure Date  . Infusion pump implantation     insulin  . Hemorrhoid surgery     Social History:  reports that he has been smoking Cigarettes.  He does not have any smokeless tobacco history on file. He reports that he does not drink alcohol  or use illicit drugs.  Family History:  Family History  Problem Relation Age of Onset  . Hypertension Mother   . Hypertension Father     Review of Systems - History obtained from the patient General ROS: positive for  - fatigue Psychological ROS: negative Ophthalmic ROS: negative ENT ROS: negative Allergy and Immunology ROS: negative Hematological and Lymphatic ROS: negative Endocrine ROS: negative Respiratory ROS: as in hpi Cardiovascular ROS: no chest pain or dyspnea on exertion Gastrointestinal ROS: no abdominal pain, change in bowel habits, or black or bloody  stools Genito-Urinary ROS: no dysuria, trouble voiding, or hematuria Musculoskeletal ROS: negative Neurological ROS: no TIA or stroke symptoms Dermatological ROS: negative  Physical Examination Blood pressure 98/55, pulse 136, temperature 98.4 F (36.9 C), temperature source Oral, resp. rate 16, SpO2 91.00%.  General appearance: alert, cooperative, appears stated age, no distress and moderately obese Head: Normocephalic, without obvious abnormality, atraumatic Eyes: conjunctivae/corneas clear. PERRL, EOM's intact.  Throat: Oral mucous membranes are dry. Neck: no adenopathy, no carotid bruit, no JVD, supple, symmetrical, trachea midline and thyroid not enlarged, symmetric, no tenderness/mass/nodules Back: symmetric, no curvature. ROM normal. No CVA tenderness. Resp: Few scattered wheezing is heard bilaterally. No crackles Cardio: S1-S2 tachycardic, regular no S3, S4. No rubs, murmurs, or bruit. No pedal edema. GI: Abdomen is soft. Minimal tenderness in the epigastric area without any rebound, rigidity, or guarding. No masses, organomegaly. Bowel sounds are present and normal. Extremities: extremities normal, atraumatic, no cyanosis or edema Pulses: 2+ and symmetric Skin: Skin color, texture, turgor normal. No rashes or lesions Lymph nodes: Cervical, supraclavicular, and axillary nodes normal. Neurologic: He is alert and oriented x3. No focal neurological deficit present.  Laboratory Data: Results for orders placed during the hospital encounter of 02/04/12 (from the past 48 hour(s))  CBC WITH DIFFERENTIAL     Status: Abnormal   Collection Time   02/04/12  6:28 PM      Component Value Range Comment   WBC 13.0 (*) 4.0 - 10.5 K/uL    RBC 5.03  4.22 - 5.81 MIL/uL    Hemoglobin 15.5  13.0 - 17.0 g/dL    HCT 16.1  09.6 - 04.5 %    MCV 88.3  78.0 - 100.0 fL    MCH 30.8  26.0 - 34.0 pg    MCHC 34.9  30.0 - 36.0 g/dL    RDW 40.9  81.1 - 91.4 %    Platelets 174  150 - 400 K/uL     Neutrophils Relative 87 (*) 43 - 77 %    Neutro Abs 11.3 (*) 1.7 - 7.7 K/uL    Lymphocytes Relative 7 (*) 12 - 46 %    Lymphs Abs 0.9  0.7 - 4.0 K/uL    Monocytes Relative 6  3 - 12 %    Monocytes Absolute 0.8  0.1 - 1.0 K/uL    Eosinophils Relative 0  0 - 5 %    Eosinophils Absolute 0.0  0.0 - 0.7 K/uL    Basophils Relative 0  0 - 1 %    Basophils Absolute 0.1  0.0 - 0.1 K/uL   COMPREHENSIVE METABOLIC PANEL     Status: Abnormal   Collection Time   02/04/12  6:28 PM      Component Value Range Comment   Sodium 139  135 - 145 mEq/L    Potassium 4.0  3.5 - 5.1 mEq/L    Chloride 100  96 - 112 mEq/L    CO2 26  19 - 32 mEq/L    Glucose, Bld 203 (*) 70 - 99 mg/dL    BUN 17  6 - 23 mg/dL    Creatinine, Ser 1.61  0.50 - 1.35 mg/dL    Calcium 9.7  8.4 - 09.6 mg/dL    Total Protein 7.0  6.0 - 8.3 g/dL    Albumin 3.8  3.5 - 5.2 g/dL    AST 14  0 - 37 U/L    ALT 17  0 - 53 U/L    Alkaline Phosphatase 61  39 - 117 U/L    Total Bilirubin 0.6  0.3 - 1.2 mg/dL    GFR calc non Af Amer 68 (*) >90 mL/min    GFR calc Af Amer 79 (*) >90 mL/min   LIPASE, BLOOD     Status: Normal   Collection Time   02/04/12  6:28 PM      Component Value Range Comment   Lipase 20  11 - 59 U/L   GLUCOSE, CAPILLARY     Status: Abnormal   Collection Time   02/04/12  6:48 PM      Component Value Range Comment   Glucose-Capillary 195 (*) 70 - 99 mg/dL   URINALYSIS, ROUTINE W REFLEX MICROSCOPIC     Status: Abnormal   Collection Time   02/04/12  6:51 PM      Component Value Range Comment   Color, Urine YELLOW  YELLOW    APPearance CLEAR  CLEAR    Specific Gravity, Urine >1.030 (*) 1.005 - 1.030    pH 5.5  5.0 - 8.0    Glucose, UA 100 (*) NEGATIVE mg/dL    Hgb urine dipstick NEGATIVE  NEGATIVE    Bilirubin Urine SMALL (*) NEGATIVE    Ketones, ur NEGATIVE  NEGATIVE mg/dL    Protein, ur 30 (*) NEGATIVE mg/dL    Urobilinogen, UA 0.2  0.0 - 1.0 mg/dL    Nitrite NEGATIVE  NEGATIVE    Leukocytes, UA NEGATIVE  NEGATIVE    URINE MICROSCOPIC-ADD ON     Status: Abnormal   Collection Time   02/04/12  6:51 PM      Component Value Range Comment   Squamous Epithelial / LPF FEW (*) RARE    WBC, UA 0-2  <3 WBC/hpf    Bacteria, UA RARE  RARE    Urine-Other MUCOUS PRESENT     BLOOD GAS, ARTERIAL     Status: Abnormal   Collection Time   02/04/12  7:00 PM      Component Value Range Comment   O2 Content 21.0      pH, Arterial 7.478 (*) 7.350 - 7.450    pCO2 arterial 33.1 (*) 35.0 - 45.0 mmHg    pO2, Arterial 56.6 (*) 80.0 - 100.0 mmHg    Bicarbonate 24.2 (*) 20.0 - 24.0 mEq/L    TCO2 20.6  0 - 100 mmol/L    Acid-Base Excess 1.0  0.0 - 2.0 mmol/L    O2 Saturation 92.0      Patient temperature 37.0      Collection site RIGHT RADIAL      Drawn by [id number]      Sample type ARTERIAL      Allens test (pass/fail) PASS  PASS   RAPID STREP SCREEN     Status: Normal   Collection Time   02/04/12  7:30 PM      Component Value Range Comment   Streptococcus, Group A Screen (Direct) NEGATIVE  NEGATIVE  GLUCOSE, CAPILLARY     Status: Abnormal   Collection Time   02/04/12  7:55 PM      Component Value Range Comment   Glucose-Capillary 202 (*) 70 - 99 mg/dL     Radiology Reports: Dg Chest 2 View  02/04/2012  *RADIOLOGY REPORT*  Clinical Data: Cough.  Rule out infiltrate.  Nausea.  Rule out free air.  CHEST - 2 VIEW  Comparison: 06/21/2008.  Findings: Prominence of the hila structures more notable on the right stable over 3 years and therefore felt to be normal for this patient.  No infiltrate, congestive heart failure or pneumothorax.  No free intraperitoneal air noted.  Heart size within normal limits.  Mild loss of height lower thoracic vertebra stable.  IMPRESSION: No acute abnormality.  Please see above.   Original Report Authenticated By: Fuller Canada, M.D.     Assessment/Plan  Principal Problem:  *Acute bronchitis Active Problems:  Tachycardia  Tobacco abuse  DM type 2 (diabetes mellitus, type 2)  Insulin pump  in place  Dehydration  Orthostatic hypotension   #1 His symptoms are most likely secondary to acute bronchitis: He will be given antibiotics intravenously, nebulizer treatments, and steroids. To be given oxygen as needed. Monitor him closely. He is currently afebrile. No infiltrate noted on chest x-ray. Strep screen was negative.  #2 dehydration with orthostatic hypotension: He'll be given IV fluids. Orthostatics will be repeated daily.   #3 diabetes, type II: Continue with insulin pump. Orders will be placed for the pump. CBGs will be monitored closely for her hyperglycemia.  #4 tobacco abuse: He'll require counseling at some point.  DVT, prophylaxis will be initiated.  He is a full code.  Anticipate patient to improve in the next day or 2.  Further management decisions will depend on results of further testing and patient's response to treatment.   [facility]  [facility] Pager [phone]  02/04/2012, 10:52 PM

## 2012-02-04 NOTE — ED Notes (Signed)
Pt reports weakness, n/v, cough/congestion for the past few days.  Pt reports seeing his pcp today and was given an inhaler and some steroids.  Pt reports that he feels worse now.  Pt denies and fever at home.

## 2012-02-04 NOTE — ED Provider Notes (Signed)
History     CSN: [id number]  Arrival date & time 02/04/12  1736   First MD Initiated Contact with Patient 02/04/12 1807      Chief Complaint  Patient presents with  . Nausea  . Emesis  . Fatigue     HPI Pt was seen at 1810.  Per pt and family, c/o gradual onset and worsening of persistent cough with post-tussive emesis, sore throat, runny/stuffy nose, sinus congestion, as well as several episodes of N/V for the past few days.  Pt states he was in contact with someone with similar symptoms at his work approx 2 days ago. Pt was eval by his PMD today, rx zithromax, albuterol tabs and albuterol MDI and was told to come back to the office tomorrow if he was not improved. Pt states he "feels worse" despite taking the meds and "my sugars are going up" despite using his insulin pump.  States his sugars have been "high."  Denies CP/palpitations, no SOB, no abd pain, no back pain, no diarrhea, no black or blood in stools or emesis, no fevers, no rash.    Past Medical History  Diagnosis Date  . DM2 (diabetes mellitus, type 2)   . Acid reflux   . CKD (chronic kidney disease), stage II   . Hyperlipidemia   . Thrombocytopenia   . HTN (hypertension)   . History of influenza 04/18/2011  . Tobacco abuse 04/18/2011  . Deafness     Left ear only. Congenital.  . Diabetes mellitus   . Vitamin B12 deficiency 04/19/2011    Past Surgical History  Procedure Date  . Infusion pump implantation     insulin     History  Substance Use Topics  . Smoking status: Current Every Day Smoker    Types: Cigarettes  . Smokeless tobacco: Not on file  . Alcohol Use: No      Review of Systems ROS: Statement: All systems negative except as marked or noted in the HPI; Constitutional: Negative for fever and +chills, generalized weakness/fatigue.. ; ; Eyes: Negative for eye pain, redness and discharge. ; ; ENMT: Negative for ear pain, hoarseness, +nasal congestion, sinus pressure and sore throat. ; ;  Cardiovascular: Negative for chest pain, palpitations, diaphoresis, dyspnea and peripheral edema. ; ; Respiratory: +cough, post-tussive emesis. Negative for wheezing and stridor. ; ; Gastrointestinal: +N/V. Negative for diarrhea, abdominal pain, blood in stool, hematemesis, jaundice and rectal bleeding. . ; ; Genitourinary: Negative for dysuria, flank pain and hematuria. ; ; Musculoskeletal: Negative for back pain and neck pain. Negative for swelling and trauma.; ; Skin: Negative for pruritus, rash, abrasions, blisters, bruising and skin lesion.; ; Neuro: Negative for headache, lightheadedness and neck stiffness. Negative for weakness, altered level of consciousness , altered mental status, extremity weakness, paresthesias, involuntary movement, seizure and syncope.       Allergies  Codeine and Glucophage  Home Medications   Current Outpatient Rx  Name Route Sig Dispense Refill  . ALBUTEROL SULFATE HFA 108 (90 BASE) MCG/ACT IN AERS Inhalation Inhale 2 puffs into the lungs every 6 (six) hours as needed for wheezing or shortness of breath. 1 Inhaler 1  . INSULIN PUMP Subcutaneous Inject into the skin daily. NOVOLOG: Consistantly     . LISINOPRIL 5 MG PO TABS Oral Take 5 mg by mouth every morning.      Marland Kitchen PANTOPRAZOLE SODIUM 40 MG PO TBEC Oral Take 40 mg by mouth every morning.      Marland Kitchen SIMVASTATIN 5 MG PO TABS  Oral Take 5 mg by mouth every morning.     Marland Kitchen VITAMIN B-12 1000 MCG PO TABS Oral Take 1 tablet (1,000 mcg total) by mouth daily. 1 tablet     BP 99/72  Pulse 151  Temp 98.2 F (36.8 C) (Oral)  Resp 20  SpO2 96%  Physical Exam 1815: Physical examination:  Nursing notes reviewed; Vital signs and O2 SAT reviewed;  Constitutional: Well developed, Well nourished, Uncomfortable appearing, coughing with post-tussive emesis during exam.; Head:  Normocephalic, atraumatic; Eyes: EOMI, PERRL, No scleral icterus; ENMT: TM's clear bilat.  +edemetous nasal turbinates bilat with clear rhinorrhea. Mouth  and pharynx normal, Mucous membranes dry; Neck: Supple, Full range of motion, No lymphadenopathy; Cardiovascular: Regular rate and rhythm, No gallop; Respiratory: Breath sounds coarse & equal bilaterally, scattered exp wheezes.  Speaking full sentences with ease, Normal respiratory effort/excursion; Chest: Nontender, Movement normal; Abdomen: Soft, Nontender, Nondistended, Normal bowel sounds; Genitourinary: No CVA tenderness; Extremities: Pulses normal, No tenderness, No edema, No calf edema or asymmetry.; Neuro: AA&Ox3, Major CN grossly intact.  Speech clear. No gross focal motor or sensory deficits in extremities.; Skin: Color normal, Warm, Dry, no rash.   ED Course  Procedures    MDM  MDM Reviewed: previous chart, nursing note and vitals Reviewed previous: labs Interpretation: labs and x-ray Total time providing critical care: 30-74 minutes. This excludes time spent performing separately reportable procedures and services. Consults: admitting MD   CRITICAL CARE Performed by: Laray Anger Total critical care time: 45 Critical care time was exclusive of separately billable procedures and treating other patients. Critical care was necessary to treat or prevent imminent or life-threatening deterioration. Critical care was time spent personally by me on the following activities: development of treatment plan with patient and/or surrogate as well as nursing, discussions with consultants, evaluation of patient's response to treatment, examination of patient, obtaining history from patient or surrogate, ordering and performing treatments and interventions, ordering and review of laboratory studies, ordering and review of radiographic studies, pulse oximetry and re-evaluation of patient's condition.   Results for orders placed during the hospital encounter of 02/04/12  CBC WITH DIFFERENTIAL      Component Value Range   WBC 13.0 (*) 4.0 - 10.5 K/uL   RBC 5.03  4.22 - 5.81 MIL/uL    Hemoglobin 15.5  13.0 - 17.0 g/dL   HCT 16.1  09.6 - 04.5 %   MCV 88.3  78.0 - 100.0 fL   MCH 30.8  26.0 - 34.0 pg   MCHC 34.9  30.0 - 36.0 g/dL   RDW 40.9  81.1 - 91.4 %   Platelets 174  150 - 400 K/uL   Neutrophils Relative 87 (*) 43 - 77 %   Neutro Abs 11.3 (*) 1.7 - 7.7 K/uL   Lymphocytes Relative 7 (*) 12 - 46 %   Lymphs Abs 0.9  0.7 - 4.0 K/uL   Monocytes Relative 6  3 - 12 %   Monocytes Absolute 0.8  0.1 - 1.0 K/uL   Eosinophils Relative 0  0 - 5 %   Eosinophils Absolute 0.0  0.0 - 0.7 K/uL   Basophils Relative 0  0 - 1 %   Basophils Absolute 0.1  0.0 - 0.1 K/uL  COMPREHENSIVE METABOLIC PANEL      Component Value Range   Sodium 139  135 - 145 mEq/L   Potassium 4.0  3.5 - 5.1 mEq/L   Chloride 100  96 - 112 mEq/L   CO2 26  19 - 32 mEq/L   Glucose, Bld 203 (*) 70 - 99 mg/dL   BUN 17  6 - 23 mg/dL   Creatinine, Ser 1.61  0.50 - 1.35 mg/dL   Calcium 9.7  8.4 - 09.6 mg/dL   Total Protein 7.0  6.0 - 8.3 g/dL   Albumin 3.8  3.5 - 5.2 g/dL   AST 14  0 - 37 U/L   ALT 17  0 - 53 U/L   Alkaline Phosphatase 61  39 - 117 U/L   Total Bilirubin 0.6  0.3 - 1.2 mg/dL   GFR calc non Af Amer 68 (*) >90 mL/min   GFR calc Af Amer 79 (*) >90 mL/min  LIPASE, BLOOD      Component Value Range   Lipase 20  11 - 59 U/L  URINALYSIS, ROUTINE W REFLEX MICROSCOPIC      Component Value Range   Color, Urine YELLOW  YELLOW   APPearance CLEAR  CLEAR   Specific Gravity, Urine >1.030 (*) 1.005 - 1.030   pH 5.5  5.0 - 8.0   Glucose, UA 100 (*) NEGATIVE mg/dL   Hgb urine dipstick NEGATIVE  NEGATIVE   Bilirubin Urine SMALL (*) NEGATIVE   Ketones, ur NEGATIVE  NEGATIVE mg/dL   Protein, ur 30 (*) NEGATIVE mg/dL   Urobilinogen, UA 0.2  0.0 - 1.0 mg/dL   Nitrite NEGATIVE  NEGATIVE   Leukocytes, UA NEGATIVE  NEGATIVE  RAPID STREP SCREEN      Component Value Range   Streptococcus, Group A Screen (Direct) NEGATIVE  NEGATIVE  BLOOD GAS, ARTERIAL      Component Value Range   O2 Content 21.0     pH,  Arterial 7.478 (*) 7.350 - 7.450   pCO2 arterial 33.1 (*) 35.0 - 45.0 mmHg   pO2, Arterial 56.6 (*) 80.0 - 100.0 mmHg   Bicarbonate 24.2 (*) 20.0 - 24.0 mEq/L   TCO2 20.6  0 - 100 mmol/L   Acid-Base Excess 1.0  0.0 - 2.0 mmol/L   O2 Saturation 92.0     Patient temperature 37.0     Collection site RIGHT RADIAL     Drawn by [id number]     Sample type ARTERIAL     Allens test (pass/fail) PASS  PASS  GLUCOSE, CAPILLARY      Component Value Range   Glucose-Capillary 195 (*) 70 - 99 mg/dL  URINE MICROSCOPIC-ADD ON      Component Value Range   Squamous Epithelial / LPF FEW (*) RARE   WBC, UA 0-2  <3 WBC/hpf   Bacteria, UA RARE  RARE   Urine-Other MUCOUS PRESENT    GLUCOSE, CAPILLARY      Component Value Range   Glucose-Capillary 202 (*) 70 - 99 mg/dL   Dg Chest 2 View 07/31/4096  *RADIOLOGY REPORT*  Clinical Data: Cough.  Rule out infiltrate.  Nausea.  Rule out free air.  CHEST - 2 VIEW  Comparison: 06/21/2008.  Findings: Prominence of the hila structures more notable on the right stable over 3 years and therefore felt to be normal for this patient.  No infiltrate, congestive heart failure or pneumothorax.  No free intraperitoneal air noted.  Heart size within normal limits.  Mild loss of height lower thoracic vertebra stable.  IMPRESSION: No acute abnormality.  Please see above.   Original Report Authenticated By: Fuller Canada, M.D.       2115:  Not acidotic, AG 13.  SBP improving with IVF.  Tachycardia improving after  IVF, but remains so after neb treatment.  No acute strep throat, pneumonia or UTI to account for symptoms; appears viral illness at this time.  N/V multiple times while in the ED.  Pt requesting another neb treatment, lungs coarse, resps without distress; will dose another neb.    2205:  Neb completed, lungs continue coarse, Sats 91-92% R/A.  Continues tachycardic.  +orthostatic despite IV NS 2L.  Refuses to take PO fluids.  Family very concerned that "it's the same as last time  when he was admitted" approx 03/2011.  EPIC chart review reveals pt's usual SBP 120-130's.  Appears dehydrated.  Will admit for observation for continued IVF, anti-emetics and neb treatments.   2225:  Dx testing d/w pt and family.  Questions answered.  Verb understanding, agreeable to observation admit.  T/C to [facility]. Rito Ehrlich, case discussed, including:  HPI, pertinent PM/SHx, VS/PE, dx testing, ED course and treatment:  Agreeable to observation admit, requests to obtain tele bed to team 1.    Laray Anger, DO 02/07/12 1003

## 2012-02-05 ENCOUNTER — Encounter (HOSPITAL_COMMUNITY): Payer: Self-pay | Admitting: *Deleted

## 2012-02-05 DIAGNOSIS — F17200 Nicotine dependence, unspecified, uncomplicated: Secondary | ICD-10-CM

## 2012-02-05 DIAGNOSIS — R112 Nausea with vomiting, unspecified: Secondary | ICD-10-CM

## 2012-02-05 LAB — COMPREHENSIVE METABOLIC PANEL
Albumin: 3.4 g/dL — ABNORMAL LOW (ref 3.5–5.2)
Alkaline Phosphatase: 58 U/L (ref 39–117)
BUN: 16 mg/dL (ref 6–23)
Calcium: 9.1 mg/dL (ref 8.4–10.5)
Creatinine, Ser: 1.24 mg/dL (ref 0.50–1.35)
GFR calc Af Amer: 81 mL/min — ABNORMAL LOW (ref >90)
Glucose, Bld: 217 mg/dL — ABNORMAL HIGH (ref 70–99)
Total Protein: 6.5 g/dL (ref 6.0–8.3)

## 2012-02-05 LAB — URINE CULTURE
Colony Count: NO GROWTH
Culture: NO GROWTH

## 2012-02-05 LAB — GLUCOSE, CAPILLARY
Glucose-Capillary: 211 mg/dL — ABNORMAL HIGH (ref 70–99)
Glucose-Capillary: 244 mg/dL — ABNORMAL HIGH (ref 70–99)
Glucose-Capillary: 227 mg/dL — ABNORMAL HIGH (ref 70–99)

## 2012-02-05 LAB — CBC
HCT: 42 % (ref 39.0–52.0)
Hemoglobin: 14.3 g/dL (ref 13.0–17.0)
MCV: 89.7 fL (ref 78.0–100.0)
RBC: 4.68 MIL/uL (ref 4.22–5.81)
RDW: 12.8 % (ref 11.5–15.5)
WBC: 9.6 10*3/uL (ref 4.0–10.5)

## 2012-02-05 LAB — HEMOGLOBIN A1C: Hgb A1c MFr Bld: 8 % — ABNORMAL HIGH (ref <5.7)

## 2012-02-05 MED ORDER — ONDANSETRON HCL 4 MG PO TABS: 4.0000 mg | ORAL_TABLET | Freq: Four times a day (QID) | ORAL | Status: DC | PRN

## 2012-02-05 MED ORDER — ACETAMINOPHEN 650 MG RE SUPP: 650.0000 mg | Freq: Four times a day (QID) | RECTAL | Status: DC | PRN

## 2012-02-05 MED ORDER — LEVOFLOXACIN IN D5W 500 MG/100ML IV SOLN
500.0000 mg | INTRAVENOUS | Status: DC
  Administered 2012-02-05: 500 mg via INTRAVENOUS
  Filled 2012-02-05 (×2): qty 100

## 2012-02-05 MED ORDER — ACETAMINOPHEN 325 MG PO TABS: 650.0000 mg | ORAL_TABLET | Freq: Four times a day (QID) | ORAL | Status: DC | PRN

## 2012-02-05 MED ORDER — VITAMIN B-12 1000 MCG PO TABS
1000.0000 ug | ORAL_TABLET | Freq: Every day | ORAL | Status: DC
  Administered 2012-02-05 – 2012-02-06 (×2): 1000 ug via ORAL
  Filled 2012-02-05 (×2): qty 1

## 2012-02-05 MED ORDER — ONDANSETRON HCL 4 MG/2ML IJ SOLN: 4.0000 mg | Freq: Four times a day (QID) | INTRAMUSCULAR | Status: DC | PRN

## 2012-02-05 MED ORDER — LEVALBUTEROL HCL 0.63 MG/3ML IN NEBU
0.6300 mg | INHALATION_SOLUTION | Freq: Four times a day (QID) | RESPIRATORY_TRACT | Status: DC
  Administered 2012-02-05 – 2012-02-06 (×6): 0.63 mg via RESPIRATORY_TRACT
  Filled 2012-02-05 (×6): qty 3

## 2012-02-05 MED ORDER — LEVALBUTEROL HCL 0.63 MG/3ML IN NEBU: 0.6300 mg | INHALATION_SOLUTION | RESPIRATORY_TRACT | Status: DC | PRN

## 2012-02-05 MED ORDER — SODIUM CHLORIDE 0.9 % IJ SOLN
INTRAMUSCULAR | Status: AC
  Filled 2012-02-05: qty 3

## 2012-02-05 MED ORDER — INSULIN PUMP
Freq: Three times a day (TID) | SUBCUTANEOUS | Status: DC
  Administered 2012-02-05: 2.7 via SUBCUTANEOUS
  Administered 2012-02-05: 3.3 via SUBCUTANEOUS
  Administered 2012-02-05: 3.7 via SUBCUTANEOUS
  Administered 2012-02-05: 2.7 via SUBCUTANEOUS
  Administered 2012-02-05: 604 via SUBCUTANEOUS
  Administered 2012-02-06: 1.7 via SUBCUTANEOUS
  Filled 2012-02-05: qty 1

## 2012-02-05 MED ORDER — ENOXAPARIN SODIUM 40 MG/0.4ML ~~LOC~~ SOLN
40.0000 mg | SUBCUTANEOUS | Status: DC
  Administered 2012-02-05: 40 mg via SUBCUTANEOUS
  Filled 2012-02-05: qty 0.4

## 2012-02-05 MED ORDER — SODIUM CHLORIDE 0.9 % IV SOLN
INTRAVENOUS | Status: DC
  Administered 2012-02-05 (×2): via INTRAVENOUS

## 2012-02-05 MED ORDER — LEVOFLOXACIN 500 MG PO TABS
500.0000 mg | ORAL_TABLET | ORAL | Status: DC
  Administered 2012-02-05: 500 mg via ORAL
  Filled 2012-02-05: qty 1

## 2012-02-05 MED ORDER — PANTOPRAZOLE SODIUM 40 MG IV SOLR
40.0000 mg | INTRAVENOUS | Status: DC
  Administered 2012-02-05 (×2): 40 mg via INTRAVENOUS
  Filled 2012-02-05 (×3): qty 40

## 2012-02-05 MED ORDER — SODIUM CHLORIDE 0.9 % IJ SOLN
3.0000 mL | Freq: Two times a day (BID) | INTRAMUSCULAR | Status: DC
  Administered 2012-02-05: 3 mL via INTRAVENOUS
  Administered 2012-02-05: 21:00:00 via INTRAVENOUS

## 2012-02-05 MED ORDER — LEVOFLOXACIN IN D5W 500 MG/100ML IV SOLN
INTRAVENOUS | Status: AC
  Filled 2012-02-05: qty 100

## 2012-02-05 MED ORDER — METHYLPREDNISOLONE SODIUM SUCC 40 MG IJ SOLR
40.0000 mg | Freq: Two times a day (BID) | INTRAMUSCULAR | Status: DC
  Administered 2012-02-05: 40 mg via INTRAVENOUS
  Filled 2012-02-05 (×2): qty 1

## 2012-02-05 MED ORDER — PREDNISONE 20 MG PO TABS
40.0000 mg | ORAL_TABLET | Freq: Every day | ORAL | Status: DC
  Administered 2012-02-06: 40 mg via ORAL
  Filled 2012-02-05: qty 2

## 2012-02-05 MED ORDER — SIMVASTATIN 10 MG PO TABS
5.0000 mg | ORAL_TABLET | Freq: Every morning | ORAL | Status: DC
  Administered 2012-02-05: 09:00:00 via ORAL
  Administered 2012-02-06: 5 mg via ORAL
  Filled 2012-02-05 (×2): qty 1

## 2012-02-05 NOTE — Care Management Note (Unsigned)
    Page 1 of 1   02/05/2012     1:43:51 PM   CARE MANAGEMENT NOTE 02/05/2012  Patient:  [name], [name]   Account Number:  [id number]  Date Initiated:  02/05/2012  Documentation initiated by:  Rosemary Holms  Subjective/Objective Assessment:   Pt admitted from home where he lives alone. When asked how he was feeling he stated he did not know why he was in the hospital.     Action/Plan:   No HH needs identified.   Anticipated DC Date:  02/06/2012   Anticipated DC Plan:  HOME/SELF CARE      DC Planning Services  CM consult      Choice offered to / List presented to:             Status of service:  In process, will continue to follow Medicare Important Message given?   (If response is "NO", the following Medicare IM given date fields will be blank) Date Medicare IM given:   Date Additional Medicare IM given:    Discharge Disposition:    Per UR Regulation:    If discussed at Long Length of Stay Meetings, dates discussed:    Comments:  02/05/12 1445 Lashaunta Sicard Leanord Hawking RN BSN CM

## 2012-02-05 NOTE — Progress Notes (Signed)
Chart reviewed.  Subjective: Feels a little better, but still weak. No vomiting, but has not eaten. Does not like hospital food. Has not ambulated. Breathing a little better. Cough a little better.  Objective: Vital signs in last 24 hours: Filed Vitals:   02/05/12 0516 02/05/12 0519 02/05/12 0522 02/05/12 0747  BP: 111/69 113/63 105/68   Pulse: 100 104 111   Temp: 98.2 F (36.8 C)     TempSrc: Oral     Resp: 20     SpO2: 94%   92%   Weight change:   Intake/Output Summary (Last 24 hours) at 02/05/12 1025 Last data filed at 02/05/12 0900  Gross per 24 hour  Intake 793.75 ml  Output      0 ml  Net 793.75 ml   General: Lying down supine. Lungs mild bilateral wheeze, no rhonchi or rales. Abdomen obese soft nontender Extremities no clubbing cyanosis or edema  Lab Results: Basic Metabolic Panel:  Lab 02/05/12 0981 02/04/12 1828  NA 140 139  K 3.9 4.0  CL 106 100  CO2 23 26  GLUCOSE 217* 203*  BUN 16 17  CREATININE 1.24 1.26  CALCIUM 9.1 9.7  MG -- --  PHOS -- --   Liver Function Tests:  Lab 02/05/12 0511 02/04/12 1828  AST 14 14  ALT 15 17  ALKPHOS 58 61  BILITOT 0.8 0.6  PROT 6.5 7.0  ALBUMIN 3.4* 3.8    Lab 02/04/12 1828  LIPASE 20  AMYLASE --   No results found for this basename: AMMONIA:2 in the last 168 hours CBC:  Lab 02/05/12 0511 02/04/12 1828  WBC 9.6 13.0*  NEUTROABS -- 11.3*  HGB 14.3 15.5  HCT 42.0 44.4  MCV 89.7 88.3  PLT 165 174   Cardiac Enzymes: No results found for this basename: CKTOTAL:3,CKMB:3,CKMBINDEX:3,TROPONINI:3 in the last 168 hours BNP: No results found for this basename: PROBNP:3 in the last 168 hours D-Dimer: No results found for this basename: DDIMER:2 in the last 168 hours CBG:  Lab 02/05/12 0819 02/05/12 0255 02/05/12 0118 02/04/12 1955 02/04/12 1848  GLUCAP 244* 211* 259* 202* 195*   Hemoglobin A1C: No results found for this basename: HGBA1C in the last 168 hours Fasting Lipid Panel: No results found for  this basename: CHOL,HDL,LDLCALC,TRIG,CHOLHDL,LDLDIRECT in the last 191 hours Thyroid Function Tests: No results found for this basename: TSH,T4TOTAL,FREET4,T3FREE,THYROIDAB in the last 168 hours Coagulation: No results found for this basename: LABPROT:4,INR:4 in the last 168 hours Anemia Panel: No results found for this basename: VITAMINB12,FOLATE,FERRITIN,TIBC,IRON,RETICCTPCT in the last 168 hours Urine Drug Screen: Drugs of Abuse  No results found for this basename: labopia, cocainscrnur, labbenz, amphetmu, thcu, labbarb    Alcohol Level: No results found for this basename: ETH:2 in the last 168 hours Urinalysis:  Lab 02/04/12 1851  COLORURINE YELLOW  LABSPEC >1.030*  PHURINE 5.5  GLUCOSEU 100*  HGBUR NEGATIVE  BILIRUBINUR SMALL*  KETONESUR NEGATIVE  PROTEINUR 30*  UROBILINOGEN 0.2  NITRITE NEGATIVE  LEUKOCYTESUR NEGATIVE   Micro Results: Recent Results (from the past 240 hour(s))  RAPID STREP SCREEN     Status: Normal   Collection Time   02/04/12  7:30 PM      Component Value Range Status Comment   Streptococcus, Group A Screen (Direct) NEGATIVE  NEGATIVE Final    Studies/Results: Dg Chest 2 View  02/04/2012  *RADIOLOGY REPORT*  Clinical Data: Cough.  Rule out infiltrate.  Nausea.  Rule out free air.  CHEST - 2 VIEW  Comparison: 06/21/2008.  Findings: Prominence of the hila structures more notable on the right stable over 3 years and therefore felt to be normal for this patient.  No infiltrate, congestive heart failure or pneumothorax.  No free intraperitoneal air noted.  Heart size within normal limits.  Mild loss of height lower thoracic vertebra stable.  IMPRESSION: No acute abnormality.  Please see above.   Original Report Authenticated By: Fuller Canada, M.D.    Scheduled Meds:   . albuterol  5 mg Nebulization Once  . albuterol  5 mg Nebulization Once  . enoxaparin (LOVENOX) injection  40 mg Subcutaneous Q24H  . insulin pump   Subcutaneous TID AC, HS, 0200  .  ipratropium  0.5 mg Nebulization Once  . ipratropium  0.5 mg Nebulization Once  . levalbuterol  0.63 mg Nebulization Q6H  . levofloxacin (LEVAQUIN) IV  500 mg Intravenous Q24H  . methylPREDNISolone (SOLU-MEDROL) injection  40 mg Intravenous Q12H  . pantoprazole (PROTONIX) IV  40 mg Intravenous Q24H  . simvastatin  5 mg Oral q morning - 10a  . sodium chloride  1,000 mL Intravenous Once  . sodium chloride  500 mL Intravenous Once  . sodium chloride  500 mL Intravenous Once  . sodium chloride  3 mL Intravenous Q12H  . sodium chloride      . vitamin B-12  1,000 mcg Oral Daily   Continuous Infusions:   . sodium chloride 125 mL/hr at 02/05/12 0920  . DISCONTD: sodium chloride 150 mL/hr at 02/04/12 1932  . DISCONTD: sodium chloride     PRN Meds:.acetaminophen, acetaminophen, levalbuterol, ondansetron (ZOFRAN) IV, ondansetron, DISCONTD: ondansetron Assessment/Plan: Principal Problem:  *Acute bronchitis Active Problems:  Tachycardia  Tobacco abuse  Nausea and vomiting in adult  DM type 2 (diabetes mellitus, type 2)  Insulin pump in place  Dehydration  Orthostatic hypotension  Change medications to by mouth. Increase activity. Wean steroids quickly as able. Slightly improved, but not yet well enough to go home.  LOS: 1 day   Kerston Landeck L 02/05/2012, 10:25 AM

## 2012-02-06 DIAGNOSIS — R062 Wheezing: Secondary | ICD-10-CM | POA: Diagnosis present

## 2012-02-06 LAB — GLUCOSE, CAPILLARY: Glucose-Capillary: 72 mg/dL (ref 70–99)

## 2012-02-06 MED ORDER — PROMETHAZINE HCL 12.5 MG PO TABS: 12.5000 mg | ORAL_TABLET | Freq: Four times a day (QID) | ORAL | Status: DC | PRN

## 2012-02-06 MED ORDER — PREDNISONE 10 MG PO TABS: ORAL_TABLET | ORAL | Status: DC

## 2012-02-06 NOTE — Progress Notes (Signed)
Pt and pt's mother provided with discharge instructions and prescriptions.  Pt and pt's mother verbalized understanding of d/c instructions.  Pt's IV removed.  Pt tolerated well.  Pt stable at time of d/c.

## 2012-02-06 NOTE — Discharge Summary (Signed)
Physician Discharge Summary  Patient ID: [name] MRN: [id number] DOB/AGE: 1970/05/08 41 y.o.  Admit date: 02/04/2012 Discharge date: 02/06/2012  Discharge Diagnoses:  Principal Problem:  *Acute bronchitis Active Problems:  Wheezing  Tachycardia  Tobacco abuse  Nausea and vomiting in adult  DM type 2 (diabetes mellitus, type 2)  Insulin pump in place  Dehydration  Orthostatic hypotension     Medication List     As of 02/06/2012  9:52 AM    STOP taking these medications         albuterol 2 MG tablet   Commonly known as: PROVENTIL      TAKE these medications         insulin pump 100 unit/ml Soln   Inject into the skin daily. NOVOLOG: Consistantly      lisinopril 5 MG tablet   Commonly known as: PRINIVIL,ZESTRIL   Take 5 mg by mouth every morning.      pantoprazole 40 MG tablet   Commonly known as: PROTONIX   Take 40 mg by mouth every morning.      predniSONE 10 MG tablet   Commonly known as: DELTASONE   Take 3 tablets on Saturday, 2 tablets on Sunday, 1 tablet on Monday      PROAIR HFA 108 (90 BASE) MCG/ACT inhaler   Generic drug: albuterol   Inhale 2 puffs into the lungs every 4 (four) hours as needed. For shortness of breath      promethazine 12.5 MG tablet   Commonly known as: PHENERGAN   Take 1 tablet (12.5 mg total) by mouth every 6 (six) hours as needed for nausea.      simvastatin 5 MG tablet   Commonly known as: ZOCOR   Take 5 mg by mouth every morning.      vitamin B-12 1000 MCG tablet   Commonly known as: CYANOCOBALAMIN   Take 1 tablet (1,000 mcg total) by mouth daily.      CYANOCOBALAMIN IJ   Inject 1 mL as directed every 30 (thirty) days. B-12 Injection      ZITHROMAX Z-PAK 250 MG tablet   Generic drug: azithromycin   Take 250-500 mg by mouth as directed. *Take two tablets on day one, then take one tablet daily for 4 days*            Discharge Orders    Future Orders Please Complete By Expires   Diet Carb Modified      Increase activity slowly         Follow-up Information    Follow up with Colette Ribas, MD. (If symptoms worsen)    Contact information:   [address] [place] [address] [phone]          Disposition: 01-Home or Self Care  Discharged Condition: stable  Consults:  none  Labs:   Results for orders placed during the hospital encounter of 02/04/12 (from the past 48 hour(s))  CBC WITH DIFFERENTIAL     Status: Abnormal   Collection Time   02/04/12  6:28 PM      Component Value Range Comment   WBC 13.0 (*) 4.0 - 10.5 K/uL    RBC 5.03  4.22 - 5.81 MIL/uL    Hemoglobin 15.5  13.0 - 17.0 g/dL    HCT 86.5  78.4 - 69.6 %    MCV 88.3  78.0 - 100.0 fL    MCH 30.8  26.0 - 34.0 pg    MCHC 34.9  30.0 - 36.0 g/dL    RDW 45.4  09.8 - 11.9 %    Platelets 174  150 - 400 K/uL    Neutrophils Relative 87 (*) 43 - 77 %    Neutro Abs 11.3 (*) 1.7 - 7.7 K/uL    Lymphocytes Relative 7 (*) 12 - 46 %    Lymphs Abs 0.9  0.7 - 4.0 K/uL    Monocytes Relative 6  3 - 12 %    Monocytes Absolute 0.8  0.1 - 1.0 K/uL    Eosinophils Relative 0  0 - 5 %    Eosinophils Absolute 0.0  0.0 - 0.7 K/uL    Basophils Relative 0  0 - 1 %    Basophils Absolute 0.1  0.0 - 0.1 K/uL   COMPREHENSIVE METABOLIC PANEL     Status: Abnormal   Collection Time   02/04/12  6:28 PM      Component Value Range Comment   Sodium 139  135 - 145 mEq/L    Potassium 4.0  3.5 - 5.1 mEq/L    Chloride 100  96 - 112 mEq/L    CO2 26  19 - 32 mEq/L    Glucose, Bld 203 (*) 70 - 99 mg/dL    BUN 17  6 - 23 mg/dL    Creatinine, Ser 1.47  0.50 - 1.35 mg/dL    Calcium 9.7  8.4 - 82.9 mg/dL    Total Protein 7.0  6.0 - 8.3 g/dL    Albumin 3.8  3.5 - 5.2 g/dL    AST 14  0 - 37 U/L    ALT 17  0 - 53 U/L    Alkaline Phosphatase 61  39 - 117 U/L    Total Bilirubin 0.6  0.3 - 1.2 mg/dL    GFR calc non Af Amer 68 (*) >90 mL/min    GFR calc Af Amer 79 (*) >90 mL/min   LIPASE, BLOOD     Status: Normal    Collection Time   02/04/12  6:28 PM      Component Value Range Comment   Lipase 20  11 - 59 U/L   GLUCOSE, CAPILLARY     Status: Abnormal   Collection Time   02/04/12  6:48 PM      Component Value Range Comment   Glucose-Capillary 195 (*) 70 - 99 mg/dL   URINE CULTURE     Status: Normal   Collection Time   02/04/12  6:51 PM      Component Value Range Comment   Specimen Description URINE, CLEAN CATCH      Special Requests NONE      Culture  Setup Time 02/04/2012 23:40      Colony Count NO GROWTH      Culture NO GROWTH      Report Status 02/05/2012 FINAL     URINALYSIS, ROUTINE W REFLEX MICROSCOPIC     Status: Abnormal   Collection Time   02/04/12  6:51 PM      Component Value Range Comment   Color, Urine YELLOW  YELLOW    APPearance CLEAR  CLEAR    Specific Gravity, Urine >1.030 (*) 1.005 - 1.030    pH 5.5  5.0 - 8.0    Glucose, UA 100 (*) NEGATIVE mg/dL    Hgb urine dipstick NEGATIVE  NEGATIVE    Bilirubin Urine SMALL (*) NEGATIVE    Ketones, ur NEGATIVE  NEGATIVE mg/dL    Protein, ur 30 (*) NEGATIVE  mg/dL    Urobilinogen, UA 0.2  0.0 - 1.0 mg/dL    Nitrite NEGATIVE  NEGATIVE    Leukocytes, UA NEGATIVE  NEGATIVE   URINE MICROSCOPIC-ADD ON     Status: Abnormal   Collection Time   02/04/12  6:51 PM      Component Value Range Comment   Squamous Epithelial / LPF FEW (*) RARE    WBC, UA 0-2  <3 WBC/hpf    Bacteria, UA RARE  RARE    Urine-Other MUCOUS PRESENT     BLOOD GAS, ARTERIAL     Status: Abnormal   Collection Time   02/04/12  7:00 PM      Component Value Range Comment   O2 Content 21.0      pH, Arterial 7.478 (*) 7.350 - 7.450    pCO2 arterial 33.1 (*) 35.0 - 45.0 mmHg    pO2, Arterial 56.6 (*) 80.0 - 100.0 mmHg    Bicarbonate 24.2 (*) 20.0 - 24.0 mEq/L    TCO2 20.6  0 - 100 mmol/L    Acid-Base Excess 1.0  0.0 - 2.0 mmol/L    O2 Saturation 92.0      Patient temperature 37.0      Collection site RIGHT RADIAL      Drawn by [id number]      Sample type ARTERIAL       Allens test (pass/fail) PASS  PASS   RAPID STREP SCREEN     Status: Normal   Collection Time   02/04/12  7:30 PM      Component Value Range Comment   Streptococcus, Group A Screen (Direct) NEGATIVE  NEGATIVE   GLUCOSE, CAPILLARY     Status: Abnormal   Collection Time   02/04/12  7:55 PM      Component Value Range Comment   Glucose-Capillary 202 (*) 70 - 99 mg/dL   TSH     Status: Normal   Collection Time   02/05/12 12:22 AM      Component Value Range Comment   TSH 0.866  0.350 - 4.500 uIU/mL   HEMOGLOBIN A1C     Status: Abnormal   Collection Time   02/05/12 12:22 AM      Component Value Range Comment   Hemoglobin A1C 8.0 (*) <5.7 %    Mean Plasma Glucose 183 (*) <117 mg/dL   GLUCOSE, CAPILLARY     Status: Abnormal   Collection Time   02/05/12  1:18 AM      Component Value Range Comment   Glucose-Capillary 259 (*) 70 - 99 mg/dL   GLUCOSE, CAPILLARY     Status: Abnormal   Collection Time   02/05/12  2:55 AM      Component Value Range Comment   Glucose-Capillary 211 (*) 70 - 99 mg/dL   COMPREHENSIVE METABOLIC PANEL     Status: Abnormal   Collection Time   02/05/12  5:11 AM      Component Value Range Comment   Sodium 140  135 - 145 mEq/L    Potassium 3.9  3.5 - 5.1 mEq/L    Chloride 106  96 - 112 mEq/L    CO2 23  19 - 32 mEq/L    Glucose, Bld 217 (*) 70 - 99 mg/dL    BUN 16  6 - 23 mg/dL    Creatinine, Ser 1.61  0.50 - 1.35 mg/dL    Calcium 9.1  8.4 - 09.6 mg/dL    Total Protein 6.5  6.0 - 8.3 g/dL  Albumin 3.4 (*) 3.5 - 5.2 g/dL    AST 14  0 - 37 U/L    ALT 15  0 - 53 U/L    Alkaline Phosphatase 58  39 - 117 U/L    Total Bilirubin 0.8  0.3 - 1.2 mg/dL    GFR calc non Af Amer 70 (*) >90 mL/min    GFR calc Af Amer 81 (*) >90 mL/min   CBC     Status: Normal   Collection Time   02/05/12  5:11 AM      Component Value Range Comment   WBC 9.6  4.0 - 10.5 K/uL    RBC 4.68  4.22 - 5.81 MIL/uL    Hemoglobin 14.3  13.0 - 17.0 g/dL    HCT 82.9  56.2 - 13.0 %    MCV 89.7   78.0 - 100.0 fL    MCH 30.6  26.0 - 34.0 pg    MCHC 34.0  30.0 - 36.0 g/dL    RDW 86.5  78.4 - 69.6 %    Platelets 165  150 - 400 K/uL   GLUCOSE, CAPILLARY     Status: Abnormal   Collection Time   02/05/12  8:19 AM      Component Value Range Comment   Glucose-Capillary 244 (*) 70 - 99 mg/dL   GLUCOSE, CAPILLARY     Status: Abnormal   Collection Time   02/05/12 11:08 AM      Component Value Range Comment   Glucose-Capillary 227 (*) 70 - 99 mg/dL   GLUCOSE, CAPILLARY     Status: Abnormal   Collection Time   02/05/12  4:22 PM      Component Value Range Comment   Glucose-Capillary 249 (*) 70 - 99 mg/dL    Comment 1 Notify RN      Comment 2 Documented in Chart     GLUCOSE, CAPILLARY     Status: Abnormal   Collection Time   02/05/12  9:08 PM      Component Value Range Comment   Glucose-Capillary 178 (*) 70 - 99 mg/dL   GLUCOSE, CAPILLARY     Status: Abnormal   Collection Time   02/06/12  2:14 AM      Component Value Range Comment   Glucose-Capillary 180 (*) 70 - 99 mg/dL   GLUCOSE, CAPILLARY     Status: Normal   Collection Time   02/06/12  7:31 AM      Component Value Range Comment   Glucose-Capillary 72  70 - 99 mg/dL    Comment 1 Notify RN      Comment 2 Documented in Chart       Diagnostics:  Dg Chest 2 View  02/04/2012  *RADIOLOGY REPORT*  Clinical Data: Cough.  Rule out infiltrate.  Nausea.  Rule out free air.  CHEST - 2 VIEW  Comparison: 06/21/2008.  Findings: Prominence of the hila structures more notable on the right stable over 3 years and therefore felt to be normal for this patient.  No infiltrate, congestive heart failure or pneumothorax.  No free intraperitoneal air noted.  Heart size within normal limits.  Mild loss of height lower thoracic vertebra stable.  IMPRESSION: No acute abnormality.  Please see above.   Original Report Authenticated By: Fuller Canada, M.D.    Hospital Course:  See H&P for complete admission details.  The patient is a 41 year old smoker  with diabetes who presented with cough, nausea vomiting, shortness of breath. She had been  started on azithromycin and steroids as well as bronchodilators prior to admission but felt worse so came to the emergency room. In the ED, heart rate was 136. He was afebrile. Blood pressure 98/55. Respiratory rate 16. Oxygen saturation 91%. Mucous membranes are noted to be dry. He had wheezing. Abdominal exam was soft. Minimal tenderness in the epigastrium. Chest x-ray showed no infiltrate.  Patient was started on IV fluids, steroids, bronchodilators, oxygen, antibiotics, anti-emetics. His insulin pump was continued. Patient's symptoms improved. By the time of discharge, he was tolerating a solid diet. No longer nauseated. Ambulating without difficulty and stable for discharge. Total time on the day of discharge greater than 30 minutes.  Discharge Exam:  Blood pressure 123/76, pulse 95, temperature 97.8 F (36.6 C), temperature source Oral, resp. rate 20, height 5\' 4"  (1.626 m), weight 85.276 kg (188 lb), SpO2 95.00%.  General: Comfortable. Lungs: Rhonchi. No wheeze. Good air movement. Cardiovascular regular rate rhythm without murmurs gallops rubs Extremities no clubbing cyanosis or edema  Signed: Paetyn Pietrzak L 02/06/2012, 9:52 AM

## 2013-02-07 ENCOUNTER — Encounter (HOSPITAL_COMMUNITY): Payer: Self-pay | Admitting: Emergency Medicine

## 2013-02-07 ENCOUNTER — Emergency Department (HOSPITAL_COMMUNITY): Payer: Managed Care, Other (non HMO)

## 2013-02-07 ENCOUNTER — Inpatient Hospital Stay (HOSPITAL_COMMUNITY)
Admission: EM | Admit: 2013-02-07 | Discharge: 2013-02-10 | DRG: 373 | Disposition: A | Discharge status: Home / Self Care | Payer: Managed Care, Other (non HMO) | Attending: Family Medicine | Admitting: Family Medicine

## 2013-02-07 DIAGNOSIS — Z8249 Family history of ischemic heart disease and other diseases of the circulatory system: Secondary | ICD-10-CM

## 2013-02-07 DIAGNOSIS — I951 Orthostatic hypotension: Secondary | ICD-10-CM

## 2013-02-07 DIAGNOSIS — Z794 Long term (current) use of insulin: Secondary | ICD-10-CM

## 2013-02-07 DIAGNOSIS — E785 Hyperlipidemia, unspecified: Secondary | ICD-10-CM | POA: Diagnosis present

## 2013-02-07 DIAGNOSIS — E860 Dehydration: Secondary | ICD-10-CM | POA: Diagnosis present

## 2013-02-07 DIAGNOSIS — N179 Acute kidney failure, unspecified: Secondary | ICD-10-CM

## 2013-02-07 DIAGNOSIS — D696 Thrombocytopenia, unspecified: Secondary | ICD-10-CM | POA: Diagnosis present

## 2013-02-07 DIAGNOSIS — Z9641 Presence of insulin pump (external) (internal): Secondary | ICD-10-CM

## 2013-02-07 DIAGNOSIS — N182 Chronic kidney disease, stage 2 (mild): Secondary | ICD-10-CM | POA: Diagnosis present

## 2013-02-07 DIAGNOSIS — Z720 Tobacco use: Secondary | ICD-10-CM | POA: Diagnosis present

## 2013-02-07 DIAGNOSIS — R509 Fever, unspecified: Secondary | ICD-10-CM | POA: Diagnosis present

## 2013-02-07 DIAGNOSIS — J209 Acute bronchitis, unspecified: Secondary | ICD-10-CM

## 2013-02-07 DIAGNOSIS — K529 Noninfective gastroenteritis and colitis, unspecified: Secondary | ICD-10-CM

## 2013-02-07 DIAGNOSIS — R000 Tachycardia, unspecified: Secondary | ICD-10-CM

## 2013-02-07 DIAGNOSIS — A020 Salmonella enteritis: Principal | ICD-10-CM | POA: Diagnosis present

## 2013-02-07 DIAGNOSIS — R112 Nausea with vomiting, unspecified: Secondary | ICD-10-CM

## 2013-02-07 DIAGNOSIS — F17200 Nicotine dependence, unspecified, uncomplicated: Secondary | ICD-10-CM | POA: Diagnosis present

## 2013-02-07 DIAGNOSIS — I129 Hypertensive chronic kidney disease with stage 1 through stage 4 chronic kidney disease, or unspecified chronic kidney disease: Secondary | ICD-10-CM | POA: Diagnosis present

## 2013-02-07 DIAGNOSIS — E871 Hypo-osmolality and hyponatremia: Secondary | ICD-10-CM

## 2013-02-07 DIAGNOSIS — R062 Wheezing: Secondary | ICD-10-CM

## 2013-02-07 DIAGNOSIS — Z8709 Personal history of other diseases of the respiratory system: Secondary | ICD-10-CM

## 2013-02-07 DIAGNOSIS — K219 Gastro-esophageal reflux disease without esophagitis: Secondary | ICD-10-CM | POA: Diagnosis present

## 2013-02-07 DIAGNOSIS — E538 Deficiency of other specified B group vitamins: Secondary | ICD-10-CM

## 2013-02-07 DIAGNOSIS — E1110 Type 2 diabetes mellitus with ketoacidosis without coma: Secondary | ICD-10-CM

## 2013-02-07 DIAGNOSIS — E119 Type 2 diabetes mellitus without complications: Secondary | ICD-10-CM | POA: Diagnosis present

## 2013-02-07 DIAGNOSIS — E162 Hypoglycemia, unspecified: Secondary | ICD-10-CM

## 2013-02-07 MED ORDER — SODIUM CHLORIDE 0.9 % IV BOLUS (SEPSIS)
1000.0000 mL | Freq: Once | INTRAVENOUS | Status: AC
  Administered 2013-02-07: 1000 mL via INTRAVENOUS

## 2013-02-07 MED ORDER — ACETAMINOPHEN 500 MG PO TABS
1000.0000 mg | ORAL_TABLET | Freq: Once | ORAL | Status: AC
  Administered 2013-02-07: 1000 mg via ORAL

## 2013-02-07 MED ORDER — ONDANSETRON 4 MG PO TBDP
4.0000 mg | ORAL_TABLET | Freq: Once | ORAL | Status: AC
  Administered 2013-02-07: 4 mg via ORAL

## 2013-02-07 NOTE — ED Provider Notes (Signed)
CSN: [id number]     Arrival date & time 02/07/13  1910 History  This chart was scribed for Sunnie Nielsen, MD by Carl Best, ED Scribe. This patient was seen in room APA05/[facility]05 and the patient's care was started at 11:05 PM.      Chief Complaint  Patient presents with  . Fever  . Emesis  . Chills    The history is provided by the patient. No language interpreter was used.   HPI Comments: [name] is a 42 y.o. male with a history of DM who presents to the Emergency Department complaining of chills, fever, bodyaches, and nausea that started at 11 AM today.  The patient's family states that the doctors at urgent care prescribed him three tablets of amoxicillin but stated that nothing was wrong with him.  The patient denies rash, dysuria, headache, abdominal pain, sore throat, neck pain, and neck stiffness as associated symptoms.  The patient confirms nausea and diarrhea as an associated symptom.  He states that the nausea has improved since this morning.  He states that he took tylenol for his symptoms with minimal relief.  The patient denies a history of abdominal surgeries.   The patient's PCP is Dr. Phillips Odor.   Past Medical History  Diagnosis Date  . DM2 (diabetes mellitus, type 2)   . Acid reflux   . CKD (chronic kidney disease), stage II   . Hyperlipidemia   . Thrombocytopenia   . HTN (hypertension)   . History of influenza 04/18/2011  . Tobacco abuse 04/18/2011  . Deafness     Left ear only. Congenital.  . Diabetes mellitus   . Vitamin B12 deficiency 04/19/2011   Past Surgical History  Procedure Laterality Date  . Infusion pump implantation      insulin  . Hemorrhoid surgery     Family History  Problem Relation Age of Onset  . Hypertension Mother   . Hypertension Father    History  Substance Use Topics  . Smoking status: Current Every Day Smoker -- 1.00 packs/day for 27 years    Types: Cigarettes  . Smokeless tobacco: Not on file  . Alcohol Use: No    Review  of Systems  Constitutional: Positive for fever (subjective) and chills.  HENT: Negative for sore throat.   Gastrointestinal: Positive for vomiting and diarrhea. Negative for abdominal pain.  Genitourinary: Negative for dysuria.  Musculoskeletal: Negative for neck pain and neck stiffness.  Skin: Negative for rash.  Neurological: Negative for headaches.  All other systems reviewed and are negative.    Allergies  Codeine and Glucophage  Home Medications   Current Outpatient Rx  Name  Route  Sig  Dispense  Refill  . amoxicillin (AMOXIL) 500 MG capsule   Oral   Take 1,500 mg by mouth 2 (two) times daily. Starting today 02/07/2013 For 10 day course         . ibuprofen (ADVIL,MOTRIN) 200 MG tablet   Oral   Take 200 mg by mouth every 6 (six) hours as needed for pain.         Marland Kitchen lisinopril (PRINIVIL,ZESTRIL) 5 MG tablet   Oral   Take 5 mg by mouth every morning.          . Multiple Vitamins-Minerals (CENTRUM PO)   Oral   Take 1 tablet by mouth daily.         Marland Kitchen NOVOLOG 100 UNIT/ML injection   Subcutaneous   Inject into the skin continuous. INSULIN PUMP (TOTAL of  200 units subcutaneously over a course of 3 days)         . pantoprazole (PROTONIX) 40 MG tablet   Oral   Take 40 mg by mouth every morning.          . simvastatin (ZOCOR) 5 MG tablet   Oral   Take 5 mg by mouth every morning.          Marland Kitchen albuterol (PROAIR HFA) 108 (90 BASE) MCG/ACT inhaler   Inhalation   Inhale 2 puffs into the lungs every 4 (four) hours as needed. For shortness of breath          Triage Vitals: BP 93/55  Pulse 108  Temp(Src) 98.5 F (36.9 C) (Oral)  Resp 16  Ht 5\' 5"  (1.651 m)  Wt 180 lb (81.647 kg)  BMI 29.95 kg/m2  SpO2 97%  Physical Exam  Nursing note and vitals reviewed. Constitutional: He is oriented to person, place, and time. He appears well-developed and well-nourished. No distress.  HENT:  Head: Normocephalic and atraumatic.  Dry mucous membranes   Eyes: EOM  are normal.  Neck: Normal range of motion. Neck supple. No tracheal deviation present.  Cardiovascular: Normal rate.   Tachycardic   Pulmonary/Chest: Effort normal. No respiratory distress.  Abdominal: Soft. He exhibits no distension. There is no tenderness. There is no rebound and no guarding.  No masses   Musculoskeletal: Normal range of motion.  Neurological: He is alert and oriented to person, place, and time.  Skin: Skin is warm and dry.  Psychiatric: He has a normal mood and affect. His behavior is normal.    ED Course  Procedures (including critical care time)  DIAGNOSTIC STUDIES: Oxygen Saturation is 97% on room air, adequate by my interpretation.    COORDINATION OF CARE: 11:08 PM- Discussed obtaining an x-ray and blood work with the patient and the patient agreed to the treatment plan.    Labs Review Labs Reviewed  BLOOD GAS, ARTERIAL - Abnormal; Notable for the following:    Acid-base deficit 2.5 (*)    All other components within normal limits  CBC - Abnormal; Notable for the following:    WBC 16.3 (*)    Platelets 146 (*)    All other components within normal limits  COMPREHENSIVE METABOLIC PANEL - Abnormal; Notable for the following:    Glucose, Bld 291 (*)    Creatinine, Ser 1.51 (*)    GFR calc non Af Amer 55 (*)    GFR calc Af Amer 63 (*)    All other components within normal limits  URINALYSIS, ROUTINE W REFLEX MICROSCOPIC - Abnormal; Notable for the following:    Specific Gravity, Urine >1.030 (*)    Glucose, UA 100 (*)    Bilirubin Urine SMALL (*)    Ketones, ur 15 (*)    All other components within normal limits  GLUCOSE, CAPILLARY - Abnormal; Notable for the following:    Glucose-Capillary 265 (*)    All other components within normal limits  DIFFERENTIAL - Abnormal; Notable for the following:    Neutrophils Relative % 84 (*)    Neutro Abs 13.1 (*)    Lymphocytes Relative 10 (*)    All other components within normal limits  GLUCOSE, CAPILLARY  - Abnormal; Notable for the following:    Glucose-Capillary 283 (*)    All other components within normal limits  CULTURE, BLOOD (ROUTINE X 2)  CULTURE, BLOOD (ROUTINE X 2)  LACTIC ACID, PLASMA   Imaging Review Dg  Chest 2 View  02/08/2013   CLINICAL DATA:  Fever, emesis, chills  EXAM: CHEST  2 VIEW  COMPARISON:  02/04/2012  FINDINGS: The heart size and mediastinal contours are within normal limits. Both lungs are clear. The visualized skeletal structures are unremarkable.  IMPRESSION: No active cardiopulmonary disease.   Electronically Signed   By: Tiburcio Pea M.D.   On: 02/08/2013 00:09    IV fluids. IV Zofran On recheck still vomiting unable to tolerate by mouth's Medicine consulted and Dr. Rito Ehrlich on to admit  MDM  Diagnosis: Nausea vomiting diarrhea with fever and persistent emesis  Evaluated with imaging and labs and urinalysis as above Remains symptomatic despite medications Serial abdominal exams benign   I personally performed the services described in this documentation, which was scribed in my presence. The recorded information has been reviewed and is accurate.     Sunnie Nielsen, MD 02/08/13 [phone]

## 2013-02-07 NOTE — ED Notes (Signed)
Patient complaining of chills, fever, body aches, and nausea that started today at approximately 1100. Reports emesis and diarrhea x 1.

## 2013-02-08 ENCOUNTER — Encounter (HOSPITAL_COMMUNITY): Payer: Self-pay | Admitting: Internal Medicine

## 2013-02-08 DIAGNOSIS — E119 Type 2 diabetes mellitus without complications: Secondary | ICD-10-CM

## 2013-02-08 DIAGNOSIS — R112 Nausea with vomiting, unspecified: Secondary | ICD-10-CM

## 2013-02-08 DIAGNOSIS — R197 Diarrhea, unspecified: Secondary | ICD-10-CM

## 2013-02-08 DIAGNOSIS — N182 Chronic kidney disease, stage 2 (mild): Secondary | ICD-10-CM

## 2013-02-08 DIAGNOSIS — R509 Fever, unspecified: Secondary | ICD-10-CM

## 2013-02-08 LAB — COMPREHENSIVE METABOLIC PANEL
ALT: 22 U/L (ref 0–53)
AST: 16 U/L (ref 0–37)
AST: 15 U/L (ref 0–37)
Albumin: 3.3 g/dL — ABNORMAL LOW (ref 3.5–5.2)
Alkaline Phosphatase: 52 U/L (ref 39–117)
Alkaline Phosphatase: 49 U/L (ref 39–117)
CO2: 26 mEq/L (ref 19–32)
Calcium: 8.6 mg/dL (ref 8.4–10.5)
Chloride: 97 mEq/L (ref 96–112)
Chloride: 105 mEq/L (ref 96–112)
Creatinine, Ser: 1.51 mg/dL — ABNORMAL HIGH (ref 0.50–1.35)
GFR calc Af Amer: 63 mL/min — ABNORMAL LOW (ref >90)
GFR calc Af Amer: 72 mL/min — ABNORMAL LOW (ref >90)
GFR calc non Af Amer: 55 mL/min — ABNORMAL LOW (ref >90)
Glucose, Bld: 291 mg/dL — ABNORMAL HIGH (ref 70–99)
Glucose, Bld: 251 mg/dL — ABNORMAL HIGH (ref 70–99)
Potassium: 4.7 mEq/L (ref 3.5–5.1)
Potassium: 3.7 mEq/L (ref 3.5–5.1)
Sodium: 136 mEq/L (ref 135–145)
Total Bilirubin: 0.5 mg/dL (ref 0.3–1.2)
Total Bilirubin: 0.4 mg/dL (ref 0.3–1.2)
Total Protein: 7.1 g/dL (ref 6.0–8.3)

## 2013-02-08 LAB — CBC
Hemoglobin: 15.7 g/dL (ref 13.0–17.0)
MCH: 31.2 pg (ref 26.0–34.0)
MCV: 88.8 fL (ref 78.0–100.0)
Platelets: 146 10*3/uL — ABNORMAL LOW (ref 150–400)
Platelets: 130 10*3/uL — ABNORMAL LOW (ref 150–400)
RBC: 5.07 MIL/uL (ref 4.22–5.81)
RDW: 12.2 % (ref 11.5–15.5)
WBC: 16.3 10*3/uL — ABNORMAL HIGH (ref 4.0–10.5)

## 2013-02-08 LAB — URINALYSIS, ROUTINE W REFLEX MICROSCOPIC
Hgb urine dipstick: NEGATIVE
Ketones, ur: 15 mg/dL — AB
Leukocytes, UA: NEGATIVE
Protein, ur: NEGATIVE mg/dL
Urobilinogen, UA: 0.2 mg/dL (ref 0.0–1.0)
pH: 5.5 (ref 5.0–8.0)

## 2013-02-08 LAB — GLUCOSE, CAPILLARY
Glucose-Capillary: 283 mg/dL — ABNORMAL HIGH (ref 70–99)
Glucose-Capillary: 251 mg/dL — ABNORMAL HIGH (ref 70–99)
Glucose-Capillary: 222 mg/dL — ABNORMAL HIGH (ref 70–99)

## 2013-02-08 LAB — DIFFERENTIAL
Basophils Absolute: 0 10*3/uL (ref 0.0–0.1)
Eosinophils Absolute: 0 10*3/uL (ref 0.0–0.7)
Eosinophils Relative: 0 % (ref 0–5)
Lymphocytes Relative: 10 % — ABNORMAL LOW (ref 12–46)
Monocytes Absolute: 0.9 10*3/uL (ref 0.1–1.0)
Neutrophils Relative %: 84 % — ABNORMAL HIGH (ref 43–77)

## 2013-02-08 LAB — TSH: TSH: 0.411 u[IU]/mL (ref 0.350–4.500)

## 2013-02-08 LAB — CLOSTRIDIUM DIFFICILE BY PCR: Toxigenic C. Difficile by PCR: NEGATIVE

## 2013-02-08 LAB — HEMOGLOBIN A1C
Hgb A1c MFr Bld: 7.7 % — ABNORMAL HIGH (ref <5.7)
Mean Plasma Glucose: 174 mg/dL — ABNORMAL HIGH (ref <117)

## 2013-02-08 LAB — LACTIC ACID, PLASMA: Lactic Acid, Venous: 1.7 mmol/L (ref 0.5–2.2)

## 2013-02-08 MED ORDER — PROMETHAZINE HCL 25 MG/ML IJ SOLN
INTRAMUSCULAR | Status: AC
  Filled 2013-02-08: qty 1

## 2013-02-08 MED ORDER — PANTOPRAZOLE SODIUM 40 MG IV SOLR
40.0000 mg | Freq: Every day | INTRAVENOUS | Status: DC
  Administered 2013-02-08 – 2013-02-10 (×3): 40 mg via INTRAVENOUS
  Filled 2013-02-08 (×3): qty 40

## 2013-02-08 MED ORDER — ACETAMINOPHEN 500 MG PO TABS
1000.0000 mg | ORAL_TABLET | Freq: Once | ORAL | Status: AC
  Administered 2013-02-08: 1000 mg via ORAL
  Filled 2013-02-08: qty 2

## 2013-02-08 MED ORDER — ACETAMINOPHEN 650 MG RE SUPP: 650.0000 mg | Freq: Four times a day (QID) | RECTAL | Status: DC | PRN

## 2013-02-08 MED ORDER — ONDANSETRON HCL 4 MG PO TABS: 4.0000 mg | ORAL_TABLET | ORAL | Status: DC | PRN

## 2013-02-08 MED ORDER — SODIUM CHLORIDE 0.9 % IV SOLN
INTRAVENOUS | Status: DC
  Administered 2013-02-08 – 2013-02-09 (×3): via INTRAVENOUS

## 2013-02-08 MED ORDER — SODIUM CHLORIDE 0.9 % IJ SOLN
3.0000 mL | Freq: Two times a day (BID) | INTRAMUSCULAR | Status: DC
  Administered 2013-02-09 (×2): 3 mL via INTRAVENOUS

## 2013-02-08 MED ORDER — HEPARIN SODIUM (PORCINE) 5000 UNIT/ML IJ SOLN
5000.0000 [IU] | Freq: Three times a day (TID) | INTRAMUSCULAR | Status: DC
  Administered 2013-02-08 – 2013-02-09 (×4): 5000 [IU] via SUBCUTANEOUS
  Filled 2013-02-08 (×4): qty 1

## 2013-02-08 MED ORDER — ONDANSETRON HCL 4 MG/2ML IJ SOLN
4.0000 mg | Freq: Once | INTRAMUSCULAR | Status: AC
  Administered 2013-02-08: 4 mg via INTRAVENOUS
  Filled 2013-02-08: qty 2

## 2013-02-08 MED ORDER — PROMETHAZINE HCL 25 MG/ML IJ SOLN
12.5000 mg | Freq: Four times a day (QID) | INTRAMUSCULAR | Status: DC | PRN
  Administered 2013-02-08: 12.5 mg via INTRAVENOUS

## 2013-02-08 MED ORDER — LEVALBUTEROL HCL 0.63 MG/3ML IN NEBU
0.6300 mg | INHALATION_SOLUTION | Freq: Four times a day (QID) | RESPIRATORY_TRACT | Status: DC
  Administered 2013-02-08 – 2013-02-09 (×5): 0.63 mg via RESPIRATORY_TRACT
  Filled 2013-02-08 (×5): qty 3

## 2013-02-08 MED ORDER — ONDANSETRON HCL 4 MG/2ML IJ SOLN: 4.0000 mg | INTRAMUSCULAR | Status: DC | PRN

## 2013-02-08 MED ORDER — ONDANSETRON HCL 4 MG PO TABS: 4.0000 mg | ORAL_TABLET | Freq: Four times a day (QID) | ORAL | Status: DC | PRN

## 2013-02-08 MED ORDER — SODIUM CHLORIDE 0.9 % IV BOLUS (SEPSIS)
1000.0000 mL | Freq: Once | INTRAVENOUS | Status: AC
  Administered 2013-02-08: 1000 mL via INTRAVENOUS

## 2013-02-08 MED ORDER — INSULIN PUMP
Freq: Three times a day (TID) | SUBCUTANEOUS | Status: DC
  Administered 2013-02-08: 6.3 via SUBCUTANEOUS
  Administered 2013-02-08: 2.8 via SUBCUTANEOUS
  Administered 2013-02-08: 3.7 via SUBCUTANEOUS
  Administered 2013-02-09: 1 via SUBCUTANEOUS
  Filled 2013-02-08: qty 1

## 2013-02-08 MED ORDER — ACETAMINOPHEN 325 MG PO TABS
650.0000 mg | ORAL_TABLET | Freq: Four times a day (QID) | ORAL | Status: DC | PRN
  Administered 2013-02-08: 650 mg via ORAL
  Filled 2013-02-08: qty 2

## 2013-02-08 MED ORDER — ONDANSETRON HCL 4 MG/2ML IJ SOLN
4.0000 mg | Freq: Four times a day (QID) | INTRAMUSCULAR | Status: DC | PRN
  Administered 2013-02-08: 4 mg via INTRAVENOUS
  Filled 2013-02-08: qty 2

## 2013-02-08 NOTE — Progress Notes (Signed)
Insulin pump contract not available at this time.  Will be administered in the am when pharmacy arrives.

## 2013-02-08 NOTE — ED Notes (Signed)
Patient vomiting at this time.   MD aware

## 2013-02-08 NOTE — Progress Notes (Signed)
Pt has had two loose BMs this morning and has had 3 episodes of emesis this morning. NP made aware. Will continue to monitor.

## 2013-02-08 NOTE — Progress Notes (Signed)
Patient uses a [employer] insulin pump which is managed by Dr. Horald Pollen.  Patient reports that he has been on an insulin pump for 7 years and his A1C is usually between 7.0-8.0%.  Reviewed insulin pump settings which are as follows:  Blood glucose target is 110 mg/dl  Basal rates:  40J  8.11 units/hour   3A     1.5 units/hour   8A     0.625 units/hour   1P     0.72 units/hour   9P     0.675 units/hour Total Daily Dose of basal insulin is 22 units/day  Carb exchanges: 12A   1.50/carb exchange(15 grams of carbs)            6A    1.00/carb exchange (15 grams of carbs)        4P     1.50/carb exchange (15 grams of carbs)  Sensitivity: 12A  1 unit of insulin drops BG 35 mg/dl                     3A   1 unit of insulin drops BG 30 mg/dl  Noted that patient did not take any insulin via insulin pump at 8am for BG of 207 mg/dl.  Patient continues to be nauseated and told the nurse he did not want to take any insulin since he had not eaten and wanted to wait until lunch time and see if his blood sugar was still elevated.  At 11:39 BG was 251 mg/dl and patient did take 6.3 units via insulin pump for correction.  According to the settings in the insulin pump with target of 110 mg/dl patient should have received 4.7 units.  In talking with Maralyn Sago, RN she states that he is also using his glucometer than communicates with his insulin pump and when our meter read 251 mg/dl his read 914 mg/dl and his correction was based on his glucometer reading.  Have asked that nursing be sure that the patient uses our glucometer readings when checking blood glucose since our meters are calibrated daily and that is what our policy is and is noted in the insulin pump contract.    Talked with the patient about taking his insulin via insulin pump when his blood glucose is greater than his target blood glucose.  Explained impact of sickness, infection, and stress on blood glucose and how all those factors actually increase blood  glucose.  Provided patient with handout on Illness and Diabetes.  Patient seems to be drowsy and closes eyes from time to time during conversation.  Informed patient that he has to be able to operate and manage his insulin pump and if he is too drowsy to operate his pump he may need to be changed to SQ insulin injections and have the insulin pump removed.  The patient states that he does not want to have his insulin pump removed.  Explained that he will need to give insulin per his insulin pump according to our glucometer readings and keep the staff informed.  In the event that the patient has to have his insulin pump removed and be changed to SQ insulin would recommend: Lantus 20 units Q24 (to start at time of insulin pump removal), Novolog sensitive correction scale Q4H, and Novolog 3 units TID with meals.  Will continue to follow as an inpatient.  Thanks, Orlando Penner, RN, MSN, CCRN Diabetes Coordinator Inpatient Diabetes Program [phone] (Team Pager) [phone] ([facility] office) [phone] [facility] office)

## 2013-02-08 NOTE — Progress Notes (Signed)
UR Chart Review Completed  

## 2013-02-08 NOTE — Progress Notes (Signed)
[facility] PROGRESS NOTE  MALIKHI OGAN ZOX:[id number] DOB: July 04, 1970 DOA: 02/07/2013 PCP: Colette Ribas, MD  Assessment/Plan: #1 fever with nausea, vomiting, and diarrhea: Most likely acute gastroenteritis. max temp since admission 99.3.  Awaiting specimen for C. Difficile and GI pathogen. Await repeat chest x-ray. Pt reports no diarrhea or vomiting and nausea improved.  Continue with IV fluids. Continue clear liquids. Will advance to full liquids for last meal. Tylenol as needed. Blood cultures pendng.White count trending downward. Lactic acid level within normal limits.Tachycardia trending down. Pt non-toxic appearing. No indication antibiotics at this time.   #2 Diabetes with insulin pump: Insulin pump order-set will be initiated. Poor control CBG range 240-290. No anion gap on admission.  Await Diabetes coordinator input. Pt reports A1c range 7-8. Await A1c. On clear liquids and appetite remains poor.    #3 history of chronic kidney disease, stage II: Creatinine slightly higher than his baseline on admission. Trending downward. Continue IV fluids. Continue to monitor urine output.   #4 history of tobacco abuse: counseled regarding cessation.  #5. Thrombocytopenia: appears to be chronic dating back several years ago. Current level appears to be in his baseline range which seems to be 120-150.    Code Status: full Family Communication: none present Disposition Plan: home when ready hopefully 24-48 hours   Consultants:  Diabetes coordinator  Procedures:  none  Antibiotics: none HPI/Subjective:   Objective: Filed Vitals:   02/08/13 0537  BP: 119/74  Pulse: 103  Temp: 99.3 F (37.4 C)  Resp: 22   No intake or output data in the 24 hours ending 02/08/13 0841 Filed Weights   02/07/13 1930 02/08/13 0407  Weight: 81.647 kg (180 lb) 81.6 kg (179 lb 14.3 oz)    Exam:   General:  In bed eyes closed easily aroused, well nourished NAD  Cardiovascular:  Tachycardic but regular No MGR No LE edema  Respiratory: normal effort BS slightly distant with faint expiratory wheeze. No crackles or rhonchi  Abdomen: round soft BS somewhat diminished. Non-tender to palpation  Musculoskeletal: no clubbing or cyanosis   Data Reviewed: Basic Metabolic Panel:  Recent Labs Lab 02/07/13 2308 02/08/13 0556  NA 136 138  K 4.7 3.7  CL 97 105  CO2 26 25  GLUCOSE 291* 251*  BUN 20 18  CREATININE 1.51* 1.36*  CALCIUM 9.4 8.6   Liver Function Tests:  Recent Labs Lab 02/07/13 2308 02/08/13 0556  AST 16 15  ALT 22 20  ALKPHOS 52 49  BILITOT 0.5 0.4  PROT 7.1 6.3  ALBUMIN 3.9 3.3*   No results found for this basename: LIPASE, AMYLASE,  in the last 168 hours No results found for this basename: AMMONIA,  in the last 168 hours CBC:  Recent Labs Lab 02/07/13 2308 02/08/13 0312 02/08/13 0556  WBC 16.3*  --  12.6*  NEUTROABS  --  13.1*  --   HGB 15.7  --  14.8  HCT 45.5  --  42.2  MCV 89.7  --  88.8  PLT 146*  --  130*   Cardiac Enzymes: No results found for this basename: CKTOTAL, CKMB, CKMBINDEX, TROPONINI,  in the last 168 hours BNP (last 3 results) No results found for this basename: PROBNP,  in the last 8760 hours CBG:  Recent Labs Lab 02/08/13 0016 02/08/13 0320 02/08/13 0533 02/08/13 0818  GLUCAP 265* 283* 241* 207*    Recent Results (from the past 240 hour(s))  CULTURE, BLOOD (ROUTINE X 2)  Status: None   Collection Time    02/08/13  3:31 AM      Result Value Range Status   Specimen Description BLOOD LEFT ANTECUBITAL   Final   Special Requests     Final   Value: BOTTLES DRAWN AEROBIC AND ANAEROBIC AEB 8CC ANA 6CC   Culture PENDING   Incomplete   Report Status PENDING   Incomplete  CULTURE, BLOOD (ROUTINE X 2)     Status: None   Collection Time    02/08/13  3:31 AM      Result Value Range Status   Specimen Description BLOOD LEFT ANTECUBITAL   Final   Special Requests BOTTLES DRAWN AEROBIC AND ANAEROBIC 10CC  EACH   Final   Culture PENDING   Incomplete   Report Status PENDING   Incomplete     Studies: Dg Chest 2 View  02/08/2013   CLINICAL DATA:  Fever, emesis, chills  EXAM: CHEST  2 VIEW  COMPARISON:  02/04/2012  FINDINGS: The heart size and mediastinal contours are within normal limits. Both lungs are clear. The visualized skeletal structures are unremarkable.  IMPRESSION: No active cardiopulmonary disease.   Electronically Signed   By: Tiburcio Pea M.D.   On: 02/08/2013 00:09    Scheduled Meds: . heparin  5,000 Units Subcutaneous Q8H  . insulin pump   Subcutaneous TID AC, HS, 0200  . levalbuterol  0.63 mg Nebulization Q6H  . pantoprazole (PROTONIX) IV  40 mg Intravenous Daily  . sodium chloride  3 mL Intravenous Q12H   Continuous Infusions: . sodium chloride 125 mL/hr at 02/08/13 0240    Principal Problem:   Fever Active Problems:   CKD (chronic kidney disease), stage II   Tobacco abuse   DM type 2 (diabetes mellitus, type 2)   Insulin pump in place   Nausea, vomiting and diarrhea    Time spent: 35 minutes    [facility]  [facility] Pager [phone]. If 7PM-7AM, please contact night-coverage at [email or web address], password [facility] 02/08/2013, 8:41 AM  LOS: 1 day     Patient seen and examined. Agree with note by Toya Smothers, NP. Multiple family members, including wife and mother at bedside updated on plan of care. This most likely represents acute viral gastroenteritis. Agree with no current indication for antibiotics. Would expect it to be self-limiting over the course of the next 24-48 hours. Patient has had 2 more episodes of diarrhea and emesis since seen by NP this morning. Will continue to follow. WBC count improving and has been afebrile since admission.  Peggye Pitt, MD [facility] Pager: [phone]

## 2013-02-08 NOTE — H&P (Signed)
[facility] History and Physical  [name] UYQ:[id number] DOB: [birth year] DOA: 02/07/2013   PCP: Colette Ribas, MD  Specialists: He follows with Dr. Talmage Nap for diabetes management  Chief Complaint: Fever with nausea, vomiting, and diarrhea  HPI: [name] is a 42 y.o. male with a past medical history of diabetes, on an insulin pump, who was in his usual state of health till 38 AM on Monday morning when he started having nausea, followed by onset of emesis. It was a clear emesis without any blood. Subsequently started having fever with temperature up to 102F with chills. He does have a chronic smoker's cough, but there has been no change in the character of the cough recently. No expectoration. He denies any abdominal pain. Denies any dysuria. Denies any joint pains or skin rashes. No sick contacts. No recent travel. Denies eating out prior to onset of his symptoms. He did have some headache on Monday, but none currently. He has had 2 episodes of loose stool, which was watery. Denies any blood in the stool. No recent hospitalization. No recent surgeries. He actually went to an rgent care center yesterday after onset of his symptoms and after evaluation he was prescribed amoxicillin for reasons that are not entirely clear.  Home Medications: Prior to Admission medications   Medication Sig Start Date End Date Taking? Authorizing Provider  ibuprofen (ADVIL,MOTRIN) 200 MG tablet Take 200 mg by mouth every 6 (six) hours as needed for pain.   Yes Historical Provider, MD  lisinopril (PRINIVIL,ZESTRIL) 5 MG tablet Take 5 mg by mouth every morning.    Yes Historical Provider, MD  Multiple Vitamins-Minerals (CENTRUM PO) Take 1 tablet by mouth daily.   Yes Historical Provider, MD  NOVOLOG 100 UNIT/ML injection Inject into the skin continuous. INSULIN PUMP (TOTAL of 200 units subcutaneously over a course of 3 days) 01/18/13  Yes Historical Provider, MD  pantoprazole (PROTONIX) 40 MG tablet  Take 40 mg by mouth every morning.    Yes Historical Provider, MD  simvastatin (ZOCOR) 5 MG tablet Take 5 mg by mouth every morning.    Yes Historical Provider, MD  albuterol (PROAIR HFA) 108 (90 BASE) MCG/ACT inhaler Inhale 2 puffs into the lungs every 4 (four) hours as needed. For shortness of breath    Historical Provider, MD    Allergies:  Allergies  Allergen Reactions  . Codeine Nausea Only    Drowsiness  . Glucophage [Metformin Hydrochloride] Other (See Comments)    Vomiting and cramps    Past Medical History: Past Medical History  Diagnosis Date  . DM2 (diabetes mellitus, type 2)   . Acid reflux   . CKD (chronic kidney disease), stage II   . Hyperlipidemia   . Thrombocytopenia   . HTN (hypertension)   . History of influenza 04/18/2011  . Tobacco abuse 04/18/2011  . Deafness     Left ear only. Congenital.  . Diabetes mellitus   . Vitamin B12 deficiency 04/19/2011    Past Surgical History  Procedure Laterality Date  . Infusion pump implantation      insulin  . Hemorrhoid surgery      Social History:  reports that he has been smoking Cigarettes.  He has a 27 pack-year smoking history. He does not have any smokeless tobacco history on file. He reports that he does not drink alcohol or use illicit drugs.  Living Situation: He lives with his family in [place] Activity Level: Usually independent with daily activities   Family  History:  Family History  Problem Relation Age of Onset  . Hypertension Mother   . Hypertension Father      Review of Systems - History obtained from the patient General ROS: positive for  - fatigue Psychological ROS: negative Ophthalmic ROS: negative ENT ROS: negative Allergy and Immunology ROS: negative Hematological and Lymphatic ROS: negative Endocrine ROS: negative Respiratory ROS: no cough, shortness of breath, or wheezing Cardiovascular ROS: no chest pain or dyspnea on exertion Gastrointestinal ROS: no abdominal pain, change  in bowel habits, or black or bloody stools Genito-Urinary ROS: no dysuria, trouble voiding, or hematuria Musculoskeletal ROS: negative Neurological ROS: no TIA or stroke symptoms Dermatological ROS: negative  Physical Examination  Filed Vitals:   02/07/13 1930 02/07/13 2144 02/07/13 2352 02/08/13 0310  BP: 108/62 93/55 107/60 111/65  Pulse: 128 108 96 121  Temp: 102.2 F (39 C) 98.5 F (36.9 C) 99 F (37.2 C) 100.9 F (38.3 C)  TempSrc: Oral Oral Oral Oral  Resp:  16 18 20   Height: 5\' 5"  (1.651 m)     Weight: 81.647 kg (180 lb)     SpO2: 92% 97% 98% 93%    General appearance: alert, cooperative, no distress and moderately obese Head: Normocephalic, without obvious abnormality, atraumatic Eyes: conjunctivae/corneas clear. PERRL, EOM's intact. Throat: lips, mucosa, and tongue normal; teeth and gums normal Neck: no adenopathy, no carotid bruit, no JVD, supple, symmetrical, trachea midline and thyroid not enlarged, symmetric, no tenderness/mass/nodules Resp: Occasional wheezing is heard, but no crackles. No rhonchi. Cardio: S1-S2 is tachycardic. Regular. No S3, S4. No rubs, murmurs, or bruits. GI: soft, non-tender; bowel sounds normal; no masses,  no organomegaly. The site of the insulin pump, looks clean without any erythema. Extremities: extremities normal, atraumatic, no cyanosis or edema Pulses: 2+ and symmetric Skin: Skin color, texture, turgor normal. No rashes or lesions Lymph nodes: Cervical, supraclavicular, and axillary nodes normal. Neurologic: He is sleepy, but easily arousable. Answers questions. Cranial nerves intact. Motor strength is equal, bilateral upper and lower extremities.  Laboratory Data: Results for orders placed during the hospital encounter of 02/07/13 (from the past 48 hour(s))  CBC     Status: Abnormal   Collection Time    02/07/13 11:08 PM      Result Value Range   WBC 16.3 (*) 4.0 - 10.5 K/uL   RBC 5.07  4.22 - 5.81 MIL/uL   Hemoglobin 15.7   13.0 - 17.0 g/dL   HCT 16.1  09.6 - 04.5 %   MCV 89.7  78.0 - 100.0 fL   MCH 31.0  26.0 - 34.0 pg   MCHC 34.5  30.0 - 36.0 g/dL   RDW 40.9  81.1 - 91.4 %   Platelets 146 (*) 150 - 400 K/uL  COMPREHENSIVE METABOLIC PANEL     Status: Abnormal   Collection Time    02/07/13 11:08 PM      Result Value Range   Sodium 136  135 - 145 mEq/L   Potassium 4.7  3.5 - 5.1 mEq/L   Chloride 97  96 - 112 mEq/L   CO2 26  19 - 32 mEq/L   Glucose, Bld 291 (*) 70 - 99 mg/dL   BUN 20  6 - 23 mg/dL   Creatinine, Ser 7.82 (*) 0.50 - 1.35 mg/dL   Calcium 9.4  8.4 - 95.6 mg/dL   Total Protein 7.1  6.0 - 8.3 g/dL   Albumin 3.9  3.5 - 5.2 g/dL   AST 16  0 -  37 U/L   ALT 22  0 - 53 U/L   Alkaline Phosphatase 52  39 - 117 U/L   Total Bilirubin 0.5  0.3 - 1.2 mg/dL   GFR calc non Af Amer 55 (*) >90 mL/min   GFR calc Af Amer 63 (*) >90 mL/min   Comment: (NOTE)     The eGFR has been calculated using the CKD EPI equation.     This calculation has not been validated in all clinical situations.     eGFR's persistently <90 mL/min signify possible Chronic Kidney     Disease.  BLOOD GAS, ARTERIAL     Status: Abnormal (Preliminary result)   Collection Time    02/07/13 11:22 PM      Result Value Range   FIO2 0.21     pH, Arterial 7.367  7.350 - 7.450   pCO2 arterial 39.3  35.0 - 45.0 mmHg   pO2, Arterial 81.2  80.0 - 100.0 mmHg   Bicarbonate 22.0  20.0 - 24.0 mEq/L   TCO2 PENDING  0 - 100 mmol/L   Acid-base deficit 2.5 (*) 0.0 - 2.0 mmol/L   O2 Saturation 95.5     Patient temperature 37.0     Collection site RIGHT RADIAL     Drawn by [phone]     Sample type ARTERIAL DRAW     Allens test (pass/fail) PASS  PASS  URINALYSIS, ROUTINE W REFLEX MICROSCOPIC     Status: Abnormal   Collection Time    02/08/13 12:12 AM      Result Value Range   Color, Urine YELLOW  YELLOW   APPearance CLEAR  CLEAR   Specific Gravity, Urine >1.030 (*) 1.005 - 1.030   pH 5.5  5.0 - 8.0   Glucose, UA 100 (*) NEGATIVE mg/dL   Hgb  urine dipstick NEGATIVE  NEGATIVE   Bilirubin Urine SMALL (*) NEGATIVE   Ketones, ur 15 (*) NEGATIVE mg/dL   Protein, ur NEGATIVE  NEGATIVE mg/dL   Urobilinogen, UA 0.2  0.0 - 1.0 mg/dL   Nitrite NEGATIVE  NEGATIVE   Leukocytes, UA NEGATIVE  NEGATIVE   Comment: MICROSCOPIC NOT DONE ON URINES WITH NEGATIVE PROTEIN, BLOOD, LEUKOCYTES, NITRITE, OR GLUCOSE <1000 mg/dL.  GLUCOSE, CAPILLARY     Status: Abnormal   Collection Time    02/08/13 12:16 AM      Result Value Range   Glucose-Capillary 265 (*) 70 - 99 mg/dL    Radiology Reports: Dg Chest 2 View  02/08/2013   CLINICAL DATA:  Fever, emesis, chills  EXAM: CHEST  2 VIEW  COMPARISON:  02/04/2012  FINDINGS: The heart size and mediastinal contours are within normal limits. Both lungs are clear. The visualized skeletal structures are unremarkable.  IMPRESSION: No active cardiopulmonary disease.   Electronically Signed   By: Tiburcio Pea M.D.   On: 02/08/2013 00:09    Problem List  Principal Problem:   Fever Active Problems:   CKD (chronic kidney disease), stage II   Tobacco abuse   DM type 2 (diabetes mellitus, type 2)   Insulin pump in place   Nausea, vomiting and diarrhea   Assessment: This is a 42 year old, Caucasian male, with a history of, diabetes. He presents with a fever, nausea and vomiting, and diarrhea. Evaluation in the ED, has not shown any obvious source of infection. This could be viral gastroenteritis. He does have a chronic cough, but examination did not reveal any obvious crackles. Chest x-ray was unremarkable. He is dehydrated.  Plan: #  1 fever with nausea, vomiting, and diarrhea: Most likely acute gastroenteritis. We will check stool for C. difficile. We will repeat another chest x-ray after he has been adequately hydrated. Continue with IV fluids. Tylenol as needed. Order blood cultures. He does have an elevated WBC. We'll get a differential. Lactic acid level will also be checked. If lactic acid level is high he  will be given empiric antibiotics.  #2 type 2 diabetes with insulin pump: Insulin pump order-set will be initiated. Diabetes coordinator will be consulted in the morning. Check HbA1c.  #3 history of chronic kidney disease, stage II: Creatinine is slightly higher than his baseline. Continue to monitor urine output.  #4 history of tobacco abuse: He'll require counseling.  DVT Prophylaxis: Heparin Code Status: Full code Family Communication: Discussed with the patient and his family  Disposition Plan: Observe to telemetry   Further management decisions will depend on results of further testing and patient's response to treatment.  [facility]  [facility] Pager [phone]  If 7PM-7AM, please contact night-coverage [email or web address] Password [email or web address]  02/08/2013, 3:20 AM

## 2013-02-09 DIAGNOSIS — K529 Noninfective gastroenteritis and colitis, unspecified: Secondary | ICD-10-CM

## 2013-02-09 DIAGNOSIS — K5289 Other specified noninfective gastroenteritis and colitis: Secondary | ICD-10-CM

## 2013-02-09 LAB — BASIC METABOLIC PANEL
BUN: 13 mg/dL (ref 6–23)
Chloride: 106 mEq/L (ref 96–112)
Creatinine, Ser: 1.24 mg/dL (ref 0.50–1.35)
GFR calc Af Amer: 80 mL/min — ABNORMAL LOW (ref >90)
GFR calc non Af Amer: 69 mL/min — ABNORMAL LOW (ref >90)
Glucose, Bld: 171 mg/dL — ABNORMAL HIGH (ref 70–99)
Sodium: 139 mEq/L (ref 135–145)

## 2013-02-09 LAB — GLUCOSE, CAPILLARY
Glucose-Capillary: 151 mg/dL — ABNORMAL HIGH (ref 70–99)
Glucose-Capillary: 170 mg/dL — ABNORMAL HIGH (ref 70–99)

## 2013-02-09 LAB — URINE MICROSCOPIC-ADD ON

## 2013-02-09 LAB — URINALYSIS, ROUTINE W REFLEX MICROSCOPIC
Glucose, UA: NEGATIVE mg/dL
Leukocytes, UA: NEGATIVE
Urobilinogen, UA: 0.2 mg/dL (ref 0.0–1.0)
pH: 6 (ref 5.0–8.0)

## 2013-02-09 LAB — CBC
HCT: 40.4 % (ref 39.0–52.0)
Hemoglobin: 13.8 g/dL (ref 13.0–17.0)
MCH: 30.5 pg (ref 26.0–34.0)
MCHC: 34.2 g/dL (ref 30.0–36.0)
MCV: 89.4 fL (ref 78.0–100.0)
RBC: 4.52 MIL/uL (ref 4.22–5.81)

## 2013-02-09 LAB — GI PATHOGEN PANEL BY PCR, STOOL
C difficile toxin A/B: NEGATIVE
Campylobacter by PCR: NEGATIVE
Cryptosporidium by PCR: NEGATIVE
E coli (STEC): NEGATIVE
E coli 0157 by PCR: NEGATIVE
G lamblia by PCR: NEGATIVE
Norovirus GI/GII: NEGATIVE
Rotavirus A by PCR: NEGATIVE
Salmonella by PCR: POSITIVE
Shigella by PCR: NEGATIVE

## 2013-02-09 MED ORDER — LEVALBUTEROL HCL 0.63 MG/3ML IN NEBU: 0.6300 mg | INHALATION_SOLUTION | Freq: Four times a day (QID) | RESPIRATORY_TRACT | Status: DC | PRN

## 2013-02-09 NOTE — Progress Notes (Signed)
[facility] PROGRESS NOTE  [name] UVO:[id number] DOB: 30-Dec-1970 DOA: 02/07/2013 PCP: Colette Ribas, MD  Assessment/Plan: #1 fever with nausea, vomiting, and diarrhea: Most likely acute gastroenteritis. Improving clinically. Requesting food. Continues with loose stool but less frequently. C. Difficile neg and GI pathogen panel pending. WC within normal limits. Blood cultures no growth to date.  Continue with IV fluids. Advance diet.     #2 Diabetes with insulin pump: Appreciate Diabetes coordinator input.  A1c 7.7. Improved control. Continue to monitor.   #3 history of chronic kidney disease, stage II: Creatinine trending downward. Continue IV fluids at lower rate.    #4 history of tobacco abuse: counseled regarding cessation.  #5. Thrombocytopenia: appears to be chronic dating back several years ago. Current level appears to be in his baseline range which seems to be 120-150.   #6. Dysuria: patient complained painful urination. Will obtain urinalysis.    Code Status: full Family Communication: parents at bedside Disposition Plan: home when ready   Consultants:  Diabetes coordinator  Procedures:  none  Antibiotics:  none  HPI/Subjective: Sitting on side of bed. Ambulates in room with steady gait. Reports dysuria. Requesting food.   Objective: Filed Vitals:   02/09/13 0630  BP: 135/85  Pulse: 100  Temp: 98.2 F (36.8 C)  Resp: 15    Intake/Output Summary (Last 24 hours) at 02/09/13 0817 Last data filed at 02/08/13 1657  Gross per 24 hour  Intake      0 ml  Output    325 ml  Net   -325 ml   Filed Weights   02/07/13 1930 02/08/13 0407  Weight: 81.647 kg (180 lb) 81.6 kg (179 lb 14.3 oz)    Exam:   General:  Well nourished NAD  Cardiovascular: tachycardia, regular No MGR No LE edema PPP  Respiratory: normal effort BS clear bilaterally to ausculation. No wheeze no rhonchi  Abdomen: round soft +BS non-tender to  palpation  Musculoskeletal: good muscle tone no clubbing no cyanosis   Data Reviewed: Basic Metabolic Panel:  Recent Labs Lab 02/07/13 2308 02/08/13 0556 02/09/13 0556  NA 136 138 139  K 4.7 3.7 3.5  CL 97 105 106  CO2 26 25 22   GLUCOSE 291* 251* 171*  BUN 20 18 13   CREATININE 1.51* 1.36* 1.24  CALCIUM 9.4 8.6 8.6   Liver Function Tests:  Recent Labs Lab 02/07/13 2308 02/08/13 0556  AST 16 15  ALT 22 20  ALKPHOS 52 49  BILITOT 0.5 0.4  PROT 7.1 6.3  ALBUMIN 3.9 3.3*   No results found for this basename: LIPASE, AMYLASE,  in the last 168 hours No results found for this basename: AMMONIA,  in the last 168 hours CBC:  Recent Labs Lab 02/07/13 2308 02/08/13 0312 02/08/13 0556 02/09/13 0556  WBC 16.3*  --  12.6* 9.7  NEUTROABS  --  13.1*  --   --   HGB 15.7  --  14.8 13.8  HCT 45.5  --  42.2 40.4  MCV 89.7  --  88.8 89.4  PLT 146*  --  130* 116*   Cardiac Enzymes: No results found for this basename: CKTOTAL, CKMB, CKMBINDEX, TROPONINI,  in the last 168 hours BNP (last 3 results) No results found for this basename: PROBNP,  in the last 8760 hours CBG:  Recent Labs Lab 02/08/13 0818 02/08/13 1139 02/08/13 1634 02/08/13 2053 02/09/13 0737  GLUCAP 207* 251* 206* 222* 151*    Recent Results (from the past 240  hour(s))  CULTURE, BLOOD (ROUTINE X 2)     Status: None   Collection Time    02/08/13  3:31 AM      Result Value Range Status   Specimen Description BLOOD LEFT ANTECUBITAL   Final   Special Requests     Final   Value: BOTTLES DRAWN AEROBIC AND ANAEROBIC AEB 8CC ANA 6CC   Culture NO GROWTH <24 HRS   Final   Report Status PENDING   Incomplete  CULTURE, BLOOD (ROUTINE X 2)     Status: None   Collection Time    02/08/13  3:31 AM      Result Value Range Status   Specimen Description BLOOD LEFT ANTECUBITAL   Final   Special Requests BOTTLES DRAWN AEROBIC AND ANAEROBIC 10CC EACH   Final   Culture NO GROWTH <24 HRS   Final   Report Status PENDING    Incomplete  CLOSTRIDIUM DIFFICILE BY PCR     Status: None   Collection Time    02/08/13  9:52 PM      Result Value Range Status   C difficile by pcr NEGATIVE  NEGATIVE Final     Studies: Dg Chest 2 View  02/08/2013   CLINICAL DATA:  Fever, emesis, chills  EXAM: CHEST  2 VIEW  COMPARISON:  02/04/2012  FINDINGS: The heart size and mediastinal contours are within normal limits. Both lungs are clear. The visualized skeletal structures are unremarkable.  IMPRESSION: No active cardiopulmonary disease.   Electronically Signed   By: Tiburcio Pea M.D.   On: 02/08/2013 00:09    Scheduled Meds: . heparin  5,000 Units Subcutaneous Q8H  . insulin pump   Subcutaneous TID AC, HS, 0200  . pantoprazole (PROTONIX) IV  40 mg Intravenous Daily  . sodium chloride  3 mL Intravenous Q12H   Continuous Infusions: . sodium chloride 125 mL/hr at 02/08/13 1339    Principal Problem:   Fever Active Problems:   CKD (chronic kidney disease), stage II   Thrombocytopenia   Tobacco abuse   DM type 2 (diabetes mellitus, type 2)   Insulin pump in place   Nausea, vomiting and diarrhea    Time spent: 35 minutes    [facility]  [facility] Pager [phone]. If 7PM-7AM, please contact night-coverage at [email or web address], password [facility] 02/09/2013, 8:17 AM  LOS: 2 days

## 2013-02-09 NOTE — Progress Notes (Signed)
Patient seen, independently examined and chart reviewed. I agree with exam, assessment and plan discussed with Toya Smothers, NP.  42 year old man presented with fever, nausea, vomiting and diarrhea. No sick contacts. Admitted for the same. Presumed to be infectious gastroenteritis.  Today he appears calm and comfortable. He is tolerating his diet. Vomiting has resolved. Still has diarrhea. No abdominal pain.  Overall he appears to be improving. We will continue supportive care. Advance diet. Presumed infectious gastroenteritis, viral. Mild thrombocytopenia seen. This is also been seen in the past and can be followed up in the outpatient setting. Would anticipate discharge in the next 48 hours.  Brendia Sacks, MD [facility] [phone]

## 2013-02-09 NOTE — Care Management Note (Signed)
    Page 1 of 1   02/09/2013     1:52:26 PM   CARE MANAGEMENT NOTE 02/09/2013  Patient:  [name], [name]   Account Number:  [id number]  Date Initiated:  02/09/2013  Documentation initiated by:  Rosemary Holms  Subjective/Objective Assessment:   Pt admitted from home where he lives with his wife. Pt declines HH.     Action/Plan:   Anticipated DC Date:  02/09/2013   Anticipated DC Plan:  HOME/SELF CARE      DC Planning Services  CM consult      Choice offered to / List presented to:             Status of service:  Completed, signed off Medicare Important Message given?   (If response is "NO", the following Medicare IM given date fields will be blank) Date Medicare IM given:   Date Additional Medicare IM given:    Discharge Disposition:    Per UR Regulation:    If discussed at Long Length of Stay Meetings, dates discussed:    Comments:  02/09/13 Rosemary Holms RN BSN CM

## 2013-02-09 NOTE — Progress Notes (Signed)
UR Chart Review Completed  

## 2013-02-09 NOTE — Progress Notes (Signed)
Per lab stool pcr POSITIVE for SALMONELLA- [name] (mid level) texted paged.

## 2013-02-10 DIAGNOSIS — A020 Salmonella enteritis: Principal | ICD-10-CM

## 2013-02-10 LAB — GLUCOSE, CAPILLARY
Glucose-Capillary: 70 mg/dL (ref 70–99)
Glucose-Capillary: 54 mg/dL — ABNORMAL LOW (ref 70–99)
Glucose-Capillary: 117 mg/dL — ABNORMAL HIGH (ref 70–99)

## 2013-02-10 LAB — CBC
HCT: 38.2 % — ABNORMAL LOW (ref 39.0–52.0)
MCV: 88.4 fL (ref 78.0–100.0)
Platelets: 112 10*3/uL — ABNORMAL LOW (ref 150–400)
RBC: 4.32 MIL/uL (ref 4.22–5.81)
RDW: 12.2 % (ref 11.5–15.5)
WBC: 9.3 10*3/uL (ref 4.0–10.5)

## 2013-02-10 LAB — BASIC METABOLIC PANEL
Calcium: 8.4 mg/dL (ref 8.4–10.5)
Chloride: 109 mEq/L (ref 96–112)
Creatinine, Ser: 1.13 mg/dL (ref 0.50–1.35)
GFR calc Af Amer: 90 mL/min — ABNORMAL LOW (ref >90)
Glucose, Bld: 81 mg/dL (ref 70–99)
Potassium: 3.4 mEq/L — ABNORMAL LOW (ref 3.5–5.1)
Sodium: 143 mEq/L (ref 135–145)

## 2013-02-10 MED ORDER — POTASSIUM CHLORIDE CRYS ER 20 MEQ PO TBCR
40.0000 meq | EXTENDED_RELEASE_TABLET | Freq: Once | ORAL | Status: AC
  Administered 2013-02-10: 40 meq via ORAL
  Filled 2013-02-10: qty 2

## 2013-02-10 NOTE — Discharge Summary (Signed)
Patient seen, independently examined and chart reviewed. I agree with exam, assessment and plan discussed with Toya Smothers, NP.  Discharge diagnoses: (These supersede that listed by Toya Smothers) 1. Salmonella gastroenteritis with associated fever 2. Nausea, vomiting, diarrhea secondary to above 3. Diabetes mellitus managed with insulin pump 4. Chronic kidney disease stage II 5. Thrombocytopenia, appears chronic  He is feeling well and is stable for discharge. Given the lack of fever, continued clinical improvement no treatment with antibiotics as indicated.   Brendia Sacks, MD [facility] [phone]

## 2013-02-10 NOTE — Progress Notes (Signed)
Pt and his wife verbalize understanding of d/c instructions and follow up appts. Pt has no questions at this time. I did review the importance of eating a more balanced diet and cleaning out home of anything that could have caused the food poisoning he was suffering with. Pts wife verbalizes understanding and cooperation with this. IV d/c without complications. Pt d/c via wheelchair accompanied by his wife and NT. Sheryn Bison

## 2013-02-10 NOTE — Progress Notes (Signed)
Hypoglycemic Event  CBG: 43  Treatment: 15 GM carbohydrate snack  Symptoms: None  Follow-up CBG: [id number] CBG Result:70  Possible Reasons for Event: Inadequate meal intake  Comments/MD notified:pt is managing his own insulin pump. Pt knows not to take any insulin bolus at this time. Encouraged pt to eat some breakfast, but he has very limited po intake at this time. Lunch glucose was 54 at 1154, Toya Smothers NP was notified at this time. Pt received orange juice, CBG was 117 at 1248. Pt continues to be asymptomatic. Diabetes Coordinator is aware and spoke with pt about increasing po intake. Pts food preferences were provided for lunch, but he continues to refuse to eat, or remove insulin pump for a short period.      Sheryn Bison  Remember to initiate Hypoglycemia Order Set & complete

## 2013-02-10 NOTE — Discharge Summary (Signed)
Physician Discharge Summary  [name] ZOX:[id number] DOB: 04-28-1971 DOA: 02/07/2013  PCP: Colette Ribas, MD  Admit date: 02/07/2013 Discharge date: 02/10/2013  Time spent: 40 minutes  Recommendations for Outpatient Follow-up:  1. Dr. Phillips Odor in 1 week for evaluation of symptoms  Discharge Diagnoses:  Principal Problem:   Fever Active Problems:   CKD (chronic kidney disease), stage II   Thrombocytopenia   Tobacco abuse   DM type 2 (diabetes mellitus, type 2)   Insulin pump in place   Nausea, vomiting and diarrhea   Gastroenteritis   Discharge Condition: stable and ready for discharge to home  Diet recommendation: carb modified  Filed Weights   02/07/13 1930 02/08/13 0407  Weight: 81.647 kg (180 lb) 81.6 kg (179 lb 14.3 oz)    History of present illness:  [name] is a 42 y.o. male with a past medical history of diabetes, on an insulin pump, who was in his usual state of health till 61 AM on 02/07/13 when he started having nausea, followed by onset of emesis. It was a clear emesis without any blood. Subsequently started having fever with temperature up to 102F with chills. He does have a chronic smoker's cough, but there had been no change in the character of the cough recently. No expectoration. He denied any abdominal pain. Denied any dysuria. Denied any joint pains or skin rashes. No sick contacts. No recent travel. Denied eating out prior to onset of his symptoms. He did have some headache on 02/07/13 but none at presentation on 02/08/13. He had 2 episodes of loose stool, which was watery. Denied any blood in the stool. No recent hospitalization. No recent surgeries. He actually went to an urgent care center after onset of his symptoms and after evaluation he was prescribed amoxicillin for reasons that are not entirely clear.   Hospital Course:  #1 fever with nausea, vomiting, and diarrhea: Admitted to medical floor. Continued with frequent loose stool.  Nausea/vomiting resolved after 24 hours. Stool studies negative for C diff negative but positive salmonella. Max temp during hospitalization 99.3. White count 12.6 on admission and 9.3 at discharge. Blood cultures yield no growth to date. Pt remained non-toxic appearing during this hospitalization. No indication antibiotics at this time. At discharge tolerating carb modified diet without problem. Continues with loose stool but much fewer episodes.   #2 Diabetes with insulin pump: Hyperglycemic on admission. No anion gap on admission. A1c 7.7.  Insulin pump resumed day #2 with assistance of diabetes coordinator. Pt with 3 episodes hypoglycemia in the 12 hours prior to discharge. He reports bolusing himself the night before and then did not eat breakfast and refused lunch. He was given orange juice x4. He was counseled regarding glucose control. He refused food when brought to him stating he would eat when he "gets home". CBG at discharge 119.   #3 history of chronic kidney disease, stage II: Creatinine slightly higher than his baseline on admission. Provided with  IV fluids. At discharge creatinine within the limits of normal.   #4 history of tobacco abuse: counseled regarding cessation.   #5. Thrombocytopenia: appears to be chronic dating back several years ago. Current level appears to be in his baseline range which seems to be 120-150.      Procedures:  none  Consultations:  none  Discharge Exam: Filed Vitals:   02/10/13 0425  BP: 132/81  Pulse: 84  Temp: 98.8 F (37.1 C)  Resp: 16    General: sitting up  in chair. Appears well.  Cardiovascular: RRR No MGR No LE edema Respiratory: normal effort BS clear bilaterally no wheeze Abdomen:soft +BS non-tender to palpation.  Discharge Instructions     Medication List    STOP taking these medications       amoxicillin 500 MG capsule  Commonly known as:  AMOXIL      TAKE these medications       CENTRUM PO  Take 1 tablet by  mouth daily.     ibuprofen 200 MG tablet  Commonly known as:  ADVIL,MOTRIN  Take 200 mg by mouth every 6 (six) hours as needed for pain.     lisinopril 5 MG tablet  Commonly known as:  PRINIVIL,ZESTRIL  Take 5 mg by mouth every morning.     NOVOLOG 100 UNIT/ML injection  Generic drug:  insulin aspart  Inject into the skin continuous. INSULIN PUMP (TOTAL of 200 units subcutaneously over a course of 3 days)     pantoprazole 40 MG tablet  Commonly known as:  PROTONIX  Take 40 mg by mouth every morning.     PROAIR HFA 108 (90 BASE) MCG/ACT inhaler  Generic drug:  albuterol  Inhale 2 puffs into the lungs every 4 (four) hours as needed. For shortness of breath     simvastatin 5 MG tablet  Commonly known as:  ZOCOR  Take 5 mg by mouth every morning.       Allergies  Allergen Reactions  . Codeine Nausea Only    Drowsiness  . Glucophage [Metformin Hydrochloride] Other (See Comments)    Vomiting and cramps      The results of significant diagnostics from this hospitalization (including imaging, microbiology, ancillary and laboratory) are listed below for reference.    Significant Diagnostic Studies: Dg Chest 2 View  02/08/2013   CLINICAL DATA:  Fever, emesis, chills  EXAM: CHEST  2 VIEW  COMPARISON:  02/04/2012  FINDINGS: The heart size and mediastinal contours are within normal limits. Both lungs are clear. The visualized skeletal structures are unremarkable.  IMPRESSION: No active cardiopulmonary disease.   Electronically Signed   By: Tiburcio Pea M.D.   On: 02/08/2013 00:09    Microbiology: Recent Results (from the past 240 hour(s))  CULTURE, BLOOD (ROUTINE X 2)     Status: None   Collection Time    02/08/13  3:31 AM      Result Value Range Status   Specimen Description BLOOD LEFT ANTECUBITAL   Final   Special Requests     Final   Value: BOTTLES DRAWN AEROBIC AND ANAEROBIC AEB=8CC ANA=6CC   Culture NO GROWTH 2 DAYS   Final   Report Status PENDING   Incomplete   CULTURE, BLOOD (ROUTINE X 2)     Status: None   Collection Time    02/08/13  3:31 AM      Result Value Range Status   Specimen Description BLOOD LEFT ANTECUBITAL   Final   Special Requests BOTTLES DRAWN AEROBIC AND ANAEROBIC 10CC EACH   Final   Culture NO GROWTH 2 DAYS   Final   Report Status PENDING   Incomplete  CLOSTRIDIUM DIFFICILE BY PCR     Status: None   Collection Time    02/08/13  9:52 PM      Result Value Range Status   C difficile by pcr NEGATIVE  NEGATIVE Final     Labs: Basic Metabolic Panel:  Recent Labs Lab 02/07/13 2308 02/08/13 0556 02/09/13 0556 02/10/13 0602  NA  136 138 139 143  K 4.7 3.7 3.5 3.4*  CL 97 105 106 109  CO2 26 25 22 25   GLUCOSE 291* 251* 171* 81  BUN 20 18 13 10   CREATININE 1.51* 1.36* 1.24 1.13  CALCIUM 9.4 8.6 8.6 8.4   Liver Function Tests:  Recent Labs Lab 02/07/13 2308 02/08/13 0556  AST 16 15  ALT 22 20  ALKPHOS 52 49  BILITOT 0.5 0.4  PROT 7.1 6.3  ALBUMIN 3.9 3.3*   No results found for this basename: LIPASE, AMYLASE,  in the last 168 hours No results found for this basename: AMMONIA,  in the last 168 hours CBC:  Recent Labs Lab 02/07/13 2308 02/08/13 0312 02/08/13 0556 02/09/13 0556 02/10/13 0602  WBC 16.3*  --  12.6* 9.7 9.3  NEUTROABS  --  13.1*  --   --   --   HGB 15.7  --  14.8 13.8 13.2  HCT 45.5  --  42.2 40.4 38.2*  MCV 89.7  --  88.8 89.4 88.4  PLT 146*  --  130* 116* 112*   Cardiac Enzymes: No results found for this basename: CKTOTAL, CKMB, CKMBINDEX, TROPONINI,  in the last 168 hours BNP: BNP (last 3 results) No results found for this basename: PROBNP,  in the last 8760 hours CBG:  Recent Labs Lab 02/10/13 0427 02/10/13 0746 02/10/13 0846 02/10/13 1154 02/10/13 1248  GLUCAP 87 43* 70 54* 117*       Signed:  BLACK,KAREN M  [facility] 02/10/2013, 1:05 PM

## 2013-02-10 NOTE — Plan of Care (Signed)
Problem: Phase I Progression Outcomes Goal: Pain controlled with appropriate interventions Outcome: Not Applicable Date Met:  02/10/13 Denies any pain     Problem: Phase III Progression Outcomes Goal: Pain controlled on oral analgesia Outcome: Not Applicable Date Met:  02/10/13 Denies any pain     Problem: Discharge Progression Outcomes Goal: Tolerating diet Outcome: Completed/Met Date Met:  02/10/13 Pt is very selective about his food choices.

## 2013-02-10 NOTE — Progress Notes (Signed)
Results for SAVAS, ELVIN (MRN [id number]) as of 02/10/2013 12:45  Ref. Range 02/10/2013 03:24 02/10/2013 04:27 02/10/2013 07:46 02/10/2013 08:46 02/10/2013 11:54  Glucose-Capillary Latest Range: 70-99 mg/dL 52 (L) 87 43 (LL) 70 54 (L)   Noted that patient has had 3 episodes of hypoglycemia today.  Spoke with patient regarding concern about low blood glucose levels today.  Patient reports that he has not eaten because he does not like the food choices we are offering him.  He states that each time his blood glucose has been low today he has drank 4oz of juice and his blood glucose has improved.  Asked patient if he were at home what he would eat for lunch.  Patient reports that he usually eats peanut butter and jelly sandwiches for lunch.  Offered to call the cafeteria and have them bring him a peanut butter and jelly sandwich to him but he states that he is going to be discharged shortly within the next two hours and he will wait and eat after he is discharged.  Discussed the possibility of removing his insulin pump for an hour or two and then replace it once his blood glucose is greater than 100 mg/dl without treatment.  Patient states that he is not going to remove his insulin pump.  He also reports that he is able to feel when he is having a low blood glucose and he does not feel that his blood glucose is low anymore after drinking the juice.  Explained rationale for requesting that he disconnect his insulin pump for an hour or two but he is adamant that his blood glucose is fine and he does not want to disconnect his pump.  Talked with Abby, RN regarding recurrent low blood glucose today and she has discussed patient with Clydie Braun, NP today and patient has refused to eat anything we have offered him.  Called dietary and asked them to bring a peanut butter and jelly sandwich to the patient.  Dietary took in the peanut butter and jelly sandwich and patient told them they could leave it but he was not going to eat  it.  Will continue to follow while inpatient.  Thanks, Orlando Penner, RN, MSN, CCRN Diabetes Coordinator Inpatient Diabetes Program [phone] (Team Pager) [phone] ([facility] office) [phone] [facility] office)

## 2013-02-13 LAB — CULTURE, BLOOD (ROUTINE X 2)
Culture: NO GROWTH
Culture: NO GROWTH

## 2013-07-19 ENCOUNTER — Other Ambulatory Visit: Payer: Self-pay | Admitting: *Deleted

## 2013-07-19 DIAGNOSIS — M79609 Pain in unspecified limb: Secondary | ICD-10-CM

## 2013-08-01 ENCOUNTER — Encounter: Payer: Self-pay | Admitting: Vascular Surgery

## 2013-08-02 ENCOUNTER — Ambulatory Visit (INDEPENDENT_AMBULATORY_CARE_PROVIDER_SITE_OTHER): Payer: Managed Care, Other (non HMO) | Admitting: Vascular Surgery

## 2013-08-02 ENCOUNTER — Encounter: Payer: Self-pay | Admitting: Vascular Surgery

## 2013-08-02 ENCOUNTER — Ambulatory Visit (HOSPITAL_COMMUNITY)
Admission: RE | Admit: 2013-08-02 | Discharge: 2013-08-02 | Disposition: A | Discharge status: Home / Self Care | Payer: Managed Care, Other (non HMO) | Source: Ambulatory Visit | Attending: Vascular Surgery | Admitting: Vascular Surgery

## 2013-08-02 VITALS — BP 135/89 | HR 88 | Resp 18 | Ht 67.0 in | Wt 184.4 lb

## 2013-08-02 DIAGNOSIS — M79609 Pain in unspecified limb: Secondary | ICD-10-CM | POA: Insufficient documentation

## 2013-08-02 NOTE — Progress Notes (Signed)
Patient name: [name] MRN: [id number] DOB: 1971-03-04 Sex: male   Referred by: Chalmers Cater  Reason for referral:  Chief Complaint  Patient presents with  . New Evaluation    right leg pain for 3-4 months, worse with prolonged standing,  pain alternates between achiness, stinging, and sharp pain,  starts at right hip and moves downward to right knee    HISTORY OF PRESENT ILLNESS: Patient presents today for evaluation of potential lower extremity arterial insufficiency. He reports a sensation of discomfort that begins in his right hip begins to come around his lateral thigh and into his knee. He reports this is a stinging sensation also reports that his own as if it is on fire and also reports a sensation like a stabbing in this area. It can occur at any time. He does not surgically related to walking. He reports that he stands for prolonged periods of time and this is more discomfort in this area. He does not have any calf symptoms. He does not have any history of tissue loss. He denies any cardiac disease. Unfortunately does smoke a pack per day and has so for many years.  Past Medical History  Diagnosis Date  . DM2 (diabetes mellitus, type 2)   . Acid reflux   . CKD (chronic kidney disease), stage II   . Hyperlipidemia   . Thrombocytopenia   . HTN (hypertension)   . History of influenza 04/18/2011  . Tobacco abuse 04/18/2011  . Deafness     Left ear only. Congenital.  . Diabetes mellitus   . Vitamin B12 deficiency 04/19/2011    Past Surgical History  Procedure Laterality Date  . Infusion pump implantation      insulin  . Hemorrhoid surgery      History   Social History  . Marital Status: Married    Spouse Name: N/A    Number of Children: N/A  . Years of Education: N/A   Occupational History  . Not on file.   Social History Main Topics  . Smoking status: Current Every Day Smoker -- 1.00 packs/day for 27 years    Types: Cigarettes  . Smokeless tobacco: Not on  file  . Alcohol Use: No  . Drug Use: No  . Sexual Activity: Yes    Birth Control/ Protection: None   Other Topics Concern  . Not on file   Social History Narrative  . No narrative on file    Family History  Problem Relation Age of Onset  . Hypertension Mother   . Hypertension Father     Allergies as of 08/02/2013 - Review Complete 08/02/2013  Allergen Reaction Noted  . Codeine Nausea Only 02/07/2011  . Glucophage [metformin hydrochloride] Other (See Comments) 02/07/2011    Current Outpatient Prescriptions on File Prior to Visit  Medication Sig Dispense Refill  . ibuprofen (ADVIL,MOTRIN) 200 MG tablet Take 200 mg by mouth every 6 (six) hours as needed for pain.      Marland Kitchen lisinopril (PRINIVIL,ZESTRIL) 5 MG tablet Take 5 mg by mouth every morning.       . Multiple Vitamins-Minerals (CENTRUM PO) Take 1 tablet by mouth daily.      Marland Kitchen NOVOLOG 100 UNIT/ML injection Inject into the skin continuous. INSULIN PUMP (TOTAL of 200 units subcutaneously over a course of 3 days)      . pantoprazole (PROTONIX) 40 MG tablet Take 40 mg by mouth every morning.       . simvastatin (ZOCOR) 5 MG  tablet Take 5 mg by mouth every morning.       Marland Kitchen albuterol (PROAIR HFA) 108 (90 BASE) MCG/ACT inhaler Inhale 2 puffs into the lungs every 4 (four) hours as needed. For shortness of breath       No current facility-administered medications on file prior to visit.     REVIEW OF SYSTEMS:  Positives indicated with an "X"  CARDIOVASCULAR:  [ ]  chest pain   [ ]  chest pressure   [ ]  palpitations   [ ]  orthopnea   [ ]  dyspnea on exertion   [ ]  claudication   [ ]  rest pain   [ ]  DVT   [ ]  phlebitis PULMONARY:   [ ]  productive cough   [ ]  asthma   [x ] wheezing NEUROLOGIC:   [ ]  weakness  [ ]  paresthesias  [ ]  aphasia  [ ]  amaurosis  [ ]  dizziness HEMATOLOGIC:   [ ]  bleeding problems   [ ]  clotting disorders MUSCULOSKELETAL:  [ ]  joint pain   [ ]  joint swelling GASTROINTESTINAL: [ ]   blood in stool  [ ]    hematemesis GENITOURINARY:  [ ]   dysuria  [ ]   hematuria PSYCHIATRIC:  [ ]  history of major depression INTEGUMENTARY:  [ ]  rashes  [ ]  ulcers CONSTITUTIONAL:  [ ]  fever   [ ]  chills  PHYSICAL EXAMINATION:  General: The patient is a well-nourished male, in no acute distress. Vital signs are BP 135/89  Pulse 88  Resp 18  Ht 5\' 7"  (1.702 m)  Wt 184 lb 6.4 oz (83.643 kg)  BMI 28.87 kg/m2 Pulmonary: There is a good air exchange  Abdomen: Soft and non-tender with normal pitch bowel sounds. Musculoskeletal: There are no major deformities.  There is no significant extremity pain. Neurologic: No focal weakness or paresthesias are detected, Skin: There are no ulcer or rashes noted. Psychiatric: The patient has normal affect. Cardiovascular: There is a regular rate and rhythm without significant murmur appreciated. Carotid arteries without bruits bilaterally Pulse status 2+ radial 2+ femoral 2+ popliteal 2+ dorsalis pedis pulses bilaterally  [facility] Vascular Lab Studies:  Ordered and Independently Reviewed revealed normal ankle arm index bilaterally and normal triphasic waveforms bilaterally in his dorsalis pedis and posterior tibial artery  Impression and Plan:  No evidence of arterial insufficiency to explain his right hip pain. This does appear to be neurogenic. I think this could be anything from pinched nerve 2 specific irritation of the nerve in this area. He will continue to discuss this with his primary care physician. I did have a long discussion regarding the critical importance of smoking cessation. I explained that with combination of his diabetes and smoking and young age but she was extremely high risk for progressing to a significant peripheral vascular occlusive disease. He will continue to work towards this to see [place] again on an as-needed basis    EARLY, TODD [facility]: [phone]

## 2014-12-20 LAB — BLOOD GAS, ARTERIAL
Acid-base deficit: 2.5 mmol/L — ABNORMAL HIGH (ref 0.0–2.0)
Bicarbonate: 22 mEq/L (ref 20.0–24.0)
Drawn by: 317771
FIO2: 0.21
O2 Saturation: 95.5 %
Patient temperature: 37
pCO2 arterial: 39.3 mmHg (ref 35.0–45.0)
pH, Arterial: 7.367 (ref 7.350–7.450)
pO2, Arterial: 81.2 mmHg (ref 80.0–100.0)

## 2015-02-17 ENCOUNTER — Encounter (HOSPITAL_COMMUNITY): Payer: Self-pay | Admitting: *Deleted

## 2015-02-17 ENCOUNTER — Emergency Department (HOSPITAL_COMMUNITY)
Admission: EM | Admit: 2015-02-17 | Discharge: 2015-02-18 | Disposition: A | Discharge status: Home / Self Care | Payer: Managed Care, Other (non HMO) | Attending: Emergency Medicine | Admitting: Emergency Medicine

## 2015-02-17 ENCOUNTER — Other Ambulatory Visit: Payer: Self-pay

## 2015-02-17 ENCOUNTER — Emergency Department (HOSPITAL_COMMUNITY): Payer: Managed Care, Other (non HMO)

## 2015-02-17 DIAGNOSIS — Z8619 Personal history of other infectious and parasitic diseases: Secondary | ICD-10-CM | POA: Diagnosis not present

## 2015-02-17 DIAGNOSIS — Z79899 Other long term (current) drug therapy: Secondary | ICD-10-CM | POA: Diagnosis not present

## 2015-02-17 DIAGNOSIS — R42 Dizziness and giddiness: Secondary | ICD-10-CM | POA: Diagnosis not present

## 2015-02-17 DIAGNOSIS — Z8719 Personal history of other diseases of the digestive system: Secondary | ICD-10-CM | POA: Diagnosis not present

## 2015-02-17 DIAGNOSIS — N182 Chronic kidney disease, stage 2 (mild): Secondary | ICD-10-CM | POA: Insufficient documentation

## 2015-02-17 DIAGNOSIS — Z720 Tobacco use: Secondary | ICD-10-CM | POA: Insufficient documentation

## 2015-02-17 DIAGNOSIS — H9192 Unspecified hearing loss, left ear: Secondary | ICD-10-CM | POA: Diagnosis not present

## 2015-02-17 DIAGNOSIS — E162 Hypoglycemia, unspecified: Secondary | ICD-10-CM

## 2015-02-17 DIAGNOSIS — E785 Hyperlipidemia, unspecified: Secondary | ICD-10-CM | POA: Insufficient documentation

## 2015-02-17 DIAGNOSIS — Z9889 Other specified postprocedural states: Secondary | ICD-10-CM | POA: Insufficient documentation

## 2015-02-17 DIAGNOSIS — I129 Hypertensive chronic kidney disease with stage 1 through stage 4 chronic kidney disease, or unspecified chronic kidney disease: Secondary | ICD-10-CM | POA: Insufficient documentation

## 2015-02-17 DIAGNOSIS — E11649 Type 2 diabetes mellitus with hypoglycemia without coma: Secondary | ICD-10-CM | POA: Diagnosis not present

## 2015-02-17 DIAGNOSIS — Z794 Long term (current) use of insulin: Secondary | ICD-10-CM | POA: Diagnosis not present

## 2015-02-17 DIAGNOSIS — Z862 Personal history of diseases of the blood and blood-forming organs and certain disorders involving the immune mechanism: Secondary | ICD-10-CM | POA: Insufficient documentation

## 2015-02-17 LAB — CBG MONITORING, ED
GLUCOSE-CAPILLARY: 89 mg/dL (ref 65–99)
GLUCOSE-CAPILLARY: 182 mg/dL — AB (ref 65–99)

## 2015-02-17 LAB — BASIC METABOLIC PANEL
ANION GAP: 10 (ref 5–15)
BUN: 16 mg/dL (ref 6–20)
CO2: 28 mmol/L (ref 22–32)
Calcium: 9 mg/dL (ref 8.9–10.3)
Chloride: 102 mmol/L (ref 101–111)
Creatinine, Ser: 1.05 mg/dL (ref 0.61–1.24)
GFR calc Af Amer: >60 mL/min (ref >60)
Glucose, Bld: 93 mg/dL (ref 65–99)
Potassium: 3.7 mmol/L (ref 3.5–5.1)
Sodium: 140 mmol/L (ref 135–145)

## 2015-02-17 LAB — URINALYSIS, ROUTINE W REFLEX MICROSCOPIC
BILIRUBIN URINE: NEGATIVE
Glucose, UA: NEGATIVE mg/dL
Hgb urine dipstick: NEGATIVE
Ketones, ur: NEGATIVE mg/dL
Leukocytes, UA: NEGATIVE
NITRITE: NEGATIVE
Protein, ur: NEGATIVE mg/dL
Specific Gravity, Urine: <1.005 — ABNORMAL LOW (ref 1.005–1.030)
UROBILINOGEN UA: 0.2 mg/dL (ref 0.0–1.0)
pH: 6 (ref 5.0–8.0)

## 2015-02-17 LAB — CBC WITH DIFFERENTIAL/PLATELET
Basophils Absolute: 0.1 10*3/uL (ref 0.0–0.1)
Basophils Relative: 1 %
Eosinophils Absolute: 0.3 10*3/uL (ref 0.0–0.7)
Eosinophils Relative: 3 %
HEMATOCRIT: 44.6 % (ref 39.0–52.0)
HEMOGLOBIN: 15.8 g/dL (ref 13.0–17.0)
LYMPHS PCT: 38 %
Lymphs Abs: 4.3 10*3/uL — ABNORMAL HIGH (ref 0.7–4.0)
MCH: 31.2 pg (ref 26.0–34.0)
MCHC: 35.4 g/dL (ref 30.0–36.0)
MCV: 88.1 fL (ref 78.0–100.0)
MONOS PCT: 9 %
Monocytes Absolute: 1 10*3/uL (ref 0.1–1.0)
NEUTROS PCT: 49 %
Neutro Abs: 5.6 10*3/uL (ref 1.7–7.7)
Platelets: 168 10*3/uL (ref 150–400)
RBC: 5.06 MIL/uL (ref 4.22–5.81)
RDW: 12.1 % (ref 11.5–15.5)
WBC: 11.2 10*3/uL — AB (ref 4.0–10.5)

## 2015-02-17 MED ORDER — MECLIZINE HCL 12.5 MG PO TABS
25.0000 mg | ORAL_TABLET | Freq: Once | ORAL | Status: AC
  Administered 2015-02-17: 25 mg via ORAL
  Filled 2015-02-17: qty 2

## 2015-02-17 NOTE — ED Notes (Signed)
Pt states about 2 hrs ago he felt like his sugar was getting low. Pt checked his sugar 30 mins PTA and it was 53 and 10 mins later it was 42. CBG checked in room 8 and his CBG is 89. Pt's lip is twitching and he feels dizzy. Wife also states his eyes were moving side to side very fast.

## 2015-02-18 LAB — CBG MONITORING, ED: Glucose-Capillary: 206 mg/dL — ABNORMAL HIGH (ref 65–99)

## 2015-02-18 MED ORDER — MECLIZINE HCL 25 MG PO TABS: 25.0000 mg | ORAL_TABLET | Freq: Three times a day (TID) | ORAL | Status: DC | PRN

## 2015-02-18 NOTE — ED Notes (Signed)
Pt states understanding of care given and follow up instructions 

## 2015-02-18 NOTE — ED Provider Notes (Signed)
CSN: [id number]     Arrival date & time 02/17/15  2137 History   First MD Initiated Contact with Patient 02/17/15 2143     Chief Complaint  Patient presents with  . Hypoglycemia     (Consider location/radiation/quality/duration/timing/severity/associated sxs/prior Treatment) The history is provided by the patient and a parent.   [name] is a 44 y.o. male with history as indicated below, most significant for type 2 DM with insulin pump presenting with hypoglycemia.  He and family was in [place] at a street event, ate hotdogs, candy about an hour before arrival here, then was visiting a friends home when he became dizzy, felt the room was spinning and wife states his lips were twitching and had rapid eye movements with positional changes.  He checked his blood glucose level which was 53, then rechecked again and is was lower at 42.  He drank a coca cola prior to arrival and his cbg increased to 89.  He denies fevers, chills, nausea, recent uri/ear or sinus pain or pressure.  He also denies any focal weakness, numbness, no speech difficulty.   Past Medical History  Diagnosis Date  . DM2 (diabetes mellitus, type 2) ([place])   . Acid reflux   . CKD (chronic kidney disease), stage II   . Hyperlipidemia   . Thrombocytopenia ([place])   . HTN (hypertension)   . History of influenza 04/18/2011  . Tobacco abuse 04/18/2011  . Deafness     Left ear only. Congenital.  . Diabetes mellitus   . Vitamin B12 deficiency 04/19/2011   Past Surgical History  Procedure Laterality Date  . Infusion pump implantation      insulin  . Hemorrhoid surgery     Family History  Problem Relation Age of Onset  . Hypertension Mother   . Hypertension Father    Social History  Substance Use Topics  . Smoking status: Current Every Day Smoker -- 1.00 packs/day for 27 years    Types: Cigarettes  . Smokeless tobacco: None  . Alcohol Use: Yes     Comment: occasionally    Review of Systems  Constitutional:  Negative for fever.  HENT: Negative for congestion and sore throat.   Eyes: Negative.  Negative for photophobia, pain and visual disturbance.  Respiratory: Negative for chest tightness and shortness of breath.   Cardiovascular: Negative for chest pain.  Gastrointestinal: Negative for nausea and abdominal pain.  Genitourinary: Negative.   Musculoskeletal: Negative for joint swelling, arthralgias and neck pain.  Skin: Negative.  Negative for rash and wound.  Neurological: Positive for dizziness. Negative for tremors, weakness, light-headedness, numbness and headaches.  Psychiatric/Behavioral: Negative.       Allergies  Codeine and Glucophage  Home Medications   Prior to Admission medications   Medication Sig Start Date End Date Taking? Authorizing Provider  lisinopril (PRINIVIL,ZESTRIL) 5 MG tablet Take 5 mg by mouth every morning.    Yes Historical Provider, MD  Multiple Vitamins-Minerals (CENTRUM PO) Take 1 tablet by mouth daily.   Yes Historical Provider, MD  NOVOLOG 100 UNIT/ML injection Inject into the skin continuous. INSULIN PUMP (TOTAL of 200 units subcutaneously over a course of 3 days) 01/18/13  Yes Historical Provider, MD  simvastatin (ZOCOR) 5 MG tablet Take 5 mg by mouth every morning.    Yes Historical Provider, MD  meclizine (ANTIVERT) 25 MG tablet Take 1 tablet (25 mg total) by mouth 3 (three) times daily as needed for dizziness. 02/18/15   Evalee Jefferson, PA-C  BP 120/84 mmHg  Pulse 73  Resp 19  SpO2 100% Physical Exam  Constitutional: He is oriented to person, place, and time. He appears well-developed and well-nourished.  HENT:  Head: Normocephalic and atraumatic.  Mouth/Throat: Oropharynx is clear and moist.  Eyes: Conjunctivae are normal. Pupils are equal, round, and reactive to light. Right eye exhibits nystagmus. Left eye exhibits nystagmus.  Neck: Normal range of motion. Neck supple.  Cardiovascular: Normal rate, regular rhythm, normal heart sounds and intact  distal pulses.   Pulmonary/Chest: Effort normal and breath sounds normal. He has no wheezes.  Abdominal: Soft. Bowel sounds are normal. There is no tenderness.  Musculoskeletal: Normal range of motion.  Lymphadenopathy:    He has no cervical adenopathy.  Neurological: He is alert and oriented to person, place, and time. He has normal strength. No cranial nerve deficit or sensory deficit. Coordination and gait normal. GCS eye subscore is 4. GCS verbal subscore is 5. GCS motor subscore is 6.  Normal heel-shin, normal rapid alternating movements. Cranial nerves III-XII intact.  No pronator drift. Lateral nystagmus present.  Skin: Skin is warm and dry. No rash noted.  Psychiatric: He has a normal mood and affect. His speech is normal and behavior is normal. Thought content normal. Cognition and memory are normal.  Nursing note and vitals reviewed.   ED Course  Procedures (including critical care time) Labs Review Labs Reviewed  URINALYSIS, ROUTINE W REFLEX MICROSCOPIC (NOT AT [facility]) - Abnormal; Notable for the following:    Specific Gravity, Urine <1.005 (*)    All other components within normal limits  CBC WITH DIFFERENTIAL/PLATELET - Abnormal; Notable for the following:    WBC 11.2 (*)    Lymphs Abs 4.3 (*)    All other components within normal limits  CBG MONITORING, ED - Abnormal; Notable for the following:    Glucose-Capillary 182 (*)    All other components within normal limits  CBG MONITORING, ED - Abnormal; Notable for the following:    Glucose-Capillary 206 (*)    All other components within normal limits  BASIC METABOLIC PANEL  CBG MONITORING, ED    Imaging Review Ct Head Wo Contrast  02/17/2015  CLINICAL DATA:  Initial evaluation for acute dizziness. EXAM: CT HEAD WITHOUT CONTRAST TECHNIQUE: Contiguous axial images were obtained from the base of the skull through the vertex without intravenous contrast. COMPARISON:  None. FINDINGS: There is no acute intracranial hemorrhage  or infarct. No mass lesion or midline shift. Gray-white matter differentiation is well maintained. Ventricles are normal in size without evidence of hydrocephalus. CSF containing spaces are within normal limits. No extra-axial fluid collection. Dystrophic calcification noted within the parenchyma of the left occipital lobe, of doubtful clinical significance. The calvarium is intact. Orbital soft tissues are within normal limits. Mild scattered mucosal thickening within the ethmoidal air cells. Paranasal sinuses are otherwise clear. No mastoid effusion. Scalp soft tissues are unremarkable. IMPRESSION: Negative head CT with no acute intracranial process identified. Electronically Signed   By: Jeannine Boga M.D.   On: 02/17/2015 23:04   I have personally reviewed and evaluated these images and lab results as part of my medical decision-making.   EKG Interpretation   Date/Time:  Saturday February 17 2015 21:51:51 EDT Ventricular Rate:  77 PR Interval:  135 QRS Duration: 90 QT Interval:  380 QTC Calculation: 430 R Axis:   78 Text Interpretation:  Sinus rhythm Baseline wander in lead(s) V6 No  previous ECGs available Confirmed by Wyvonnia Dusky  MD, STEPHEN (  [id number]) on  02/17/2015 10:14:11 PM      MDM   Final diagnoses:  Hypoglycemia  Vertigo    Pt was feeling improved at time of dc, vertigo improved after receiving meclizine.  Also had a snack and had 2 cbg's over 100, in fact the second was 206.  He was advised to turn his insulin pump back on which he did.  Prescribed meclizine. Advised to keep close watch on cbg's, return here for any return of sx, or f/u with pcp.  Also advised recheck by pcp if dizziness persists beyond the next week.  No focal findings except for nystagmus which improved with meclizine. Exam c/w peripheral vertigo.  Patients  labs reviewed.  Radiological studies were viewed, interpreted and considered during the medical decision making and disposition process. I agree  with radiologists reading.  Results were also discussed with patient and family.  He was seen by Dr Wyvonnia Dusky during this visit.       Evalee Jefferson, PA-C 02/18/15 0230  Ezequiel Essex, MD 02/18/15 [phone]

## 2015-02-18 NOTE — Discharge Instructions (Signed)
Blood Glucose Monitoring, Adult Monitoring your blood glucose (also know as blood sugar) helps you to manage your diabetes. It also helps you and your health care provider monitor your diabetes and determine how well your treatment plan is working. WHY SHOULD YOU MONITOR YOUR BLOOD GLUCOSE?  It can help you understand how food, exercise, and medicine affect your blood glucose.  It allows you to know what your blood glucose is at any given moment. You can quickly tell if you are having low blood glucose (hypoglycemia) or high blood glucose (hyperglycemia).  It can help you and your health care provider know how to adjust your medicines.  It can help you understand how to manage an illness or adjust medicine for exercise. WHEN SHOULD YOU TEST? Your health care provider will help you decide how often you should check your blood glucose. This may depend on the type of diabetes you have, your diabetes control, or the types of medicines you are taking. Be sure to write down all of your blood glucose readings so that this information can be reviewed with your health care provider. See below for examples of testing times that your health care provider may suggest. Type 1 Diabetes  Test at least 2 times per day if your diabetes is well controlled, if you are using an insulin pump, or if you perform multiple daily injections.  If your diabetes is not well controlled or if you are sick, you may need to test more often.  It is a good idea to also test:  Before every insulin injection.  Before and after exercise.  Between meals and 2 hours after a meal.  Occasionally between 2:00 a.m. and 3:00 a.m. Type 2 Diabetes  If you are taking insulin, test at least 2 times per day. However, it is best to test before every insulin injection.  If you take medicines by mouth (orally), test 2 times a day.  If you are on a controlled diet, test once a day.  If your diabetes is not well controlled or if you  are sick, you may need to monitor more often. HOW TO MONITOR YOUR BLOOD GLUCOSE Supplies Needed  Blood glucose meter.  Test strips for your meter. Each meter has its own strips. You must use the strips that go with your own meter.  A pricking needle (lancet).  A device that holds the lancet (lancing device).  A journal or log book to write down your results. Procedure  Wash your hands with soap and water. Alcohol is not preferred.  Prick the side of your finger (not the tip) with the lancet.  Gently milk the finger until a small drop of blood appears.  Follow the instructions that come with your meter for inserting the test strip, applying blood to the strip, and using your blood glucose meter. Other Areas to Get Blood for Testing Some meters allow you to use other areas of your body (other than your finger) to test your blood. These areas are called alternative sites. The most common alternative sites are:  The forearm.  The thigh.  The back area of the lower leg.  The palm of the hand. The blood flow in these areas is slower. Therefore, the blood glucose values you get may be delayed, and the numbers are different from what you would get from your fingers. Do not use alternative sites if you think you are having hypoglycemia. Your reading will not be accurate. Always use a finger if you are  having hypoglycemia. Also, if you cannot feel your lows (hypoglycemia unawareness), always use your fingers for your blood glucose checks. ADDITIONAL TIPS FOR GLUCOSE MONITORING  Do not reuse lancets.  Always carry your supplies with you.  All blood glucose meters have a 24-hour "hotline" number to call if you have questions or need help.  Adjust (calibrate) your blood glucose meter with a control solution after finishing a few boxes of strips. BLOOD GLUCOSE RECORD KEEPING It is a good idea to keep a daily record or log of your blood glucose readings. Most glucose meters, if not all,  keep your glucose records stored in the meter. Some meters come with the ability to download your records to your home computer. Keeping a record of your blood glucose readings is especially helpful if you are wanting to look for patterns. Make notes to go along with the blood glucose readings because you might forget what happened at that exact time. Keeping good records helps you and your health care provider to work together to achieve good diabetes management.    This information is not intended to replace advice given to you by your health care provider. Make sure you discuss any questions you have with your health care provider.   Document Released: 04/17/2003 Document Revised: 05/05/2014 Document Reviewed: 09/06/2012 Elsevier Interactive Patient Education 2016 Elsevier Inc.  Benign Positional Vertigo Vertigo is the feeling that you or your surroundings are moving when they are not. Benign positional vertigo is the most common form of vertigo. The cause of this condition is not serious (is benign). This condition is triggered by certain movements and positions (is positional). This condition can be dangerous if it occurs while you are doing something that could endanger you or others, such as driving.  CAUSES In many cases, the cause of this condition is not known. It may be caused by a disturbance in an area of the inner ear that helps your brain to sense movement and balance. This disturbance can be caused by a viral infection (labyrinthitis), head injury, or repetitive motion. RISK FACTORS This condition is more likely to develop in:  Women.  People who are 46 years of age or older. SYMPTOMS Symptoms of this condition usually happen when you move your head or your eyes in different directions. Symptoms may start suddenly, and they usually last for less than a minute. Symptoms may include:  Loss of balance and falling.  Feeling like you are spinning or moving.  Feeling like your  surroundings are spinning or moving.  Nausea and vomiting.  Blurred vision.  Dizziness.  Involuntary eye movement (nystagmus). Symptoms can be mild and cause only slight annoyance, or they can be severe and interfere with daily life. Episodes of benign positional vertigo may return (recur) over time, and they may be triggered by certain movements. Symptoms may improve over time. DIAGNOSIS This condition is usually diagnosed by medical history and a physical exam of the head, neck, and ears. You may be referred to a health care provider who specializes in ear, nose, and throat (ENT) problems (otolaryngologist) or a provider who specializes in disorders of the nervous system (neurologist). You may have additional testing, including:  MRI.  A CT scan.  Eye movement tests. Your health care provider may ask you to change positions quickly while he or she watches you for symptoms of benign positional vertigo, such as nystagmus. Eye movement may be tested with an electronystagmogram (ENG), caloric stimulation, the Dix-Hallpike test, or the roll test.  An electroencephalogram (EEG). This records electrical activity in your brain.  Hearing tests. TREATMENT Usually, your health care provider will treat this by moving your head in specific positions to adjust your inner ear back to normal. Surgery may be needed in severe cases, but this is rare. In some cases, benign positional vertigo may resolve on its own in 2-4 weeks. HOME CARE INSTRUCTIONS Safety  Move slowly.Avoid sudden body or head movements.  Avoid driving.  Avoid operating heavy machinery.  Avoid doing any tasks that would be dangerous to you or others if a vertigo episode would occur.  If you have trouble walking or keeping your balance, try using a cane for stability. If you feel dizzy or unstable, sit down right away.  Return to your normal activities as told by your health care provider. Ask your health care provider what  activities are safe for you. General Instructions  Take over-the-counter and prescription medicines only as told by your health care provider.  Avoid certain positions or movements as told by your health care provider.  Drink enough fluid to keep your urine clear or pale yellow.  Keep all follow-up visits as told by your health care provider. This is important. SEEK MEDICAL CARE IF:  You have a fever.  Your condition gets worse or you develop new symptoms.  Your family or friends notice any behavioral changes.  Your nausea or vomiting gets worse.  You have numbness or a "pins and needles" sensation. SEEK IMMEDIATE MEDICAL CARE IF:  You have difficulty speaking or moving.  You are always dizzy.  You faint.  You develop severe headaches.  You have weakness in your legs or arms.  You have changes in your hearing or vision.  You develop a stiff neck.  You develop sensitivity to light.   This information is not intended to replace advice given to you by your health care provider. Make sure you discuss any questions you have with your health care provider.   Document Released: 01/20/2006 Document Revised: 01/03/2015 Document Reviewed: 08/07/2014 Elsevier Interactive Patient Education [employer].

## 2015-05-04 ENCOUNTER — Encounter (HOSPITAL_COMMUNITY): Payer: Self-pay | Admitting: *Deleted

## 2015-05-04 ENCOUNTER — Emergency Department (HOSPITAL_COMMUNITY): Payer: Worker's Compensation

## 2015-05-04 ENCOUNTER — Emergency Department (HOSPITAL_COMMUNITY)
Admission: EM | Admit: 2015-05-04 | Discharge: 2015-05-04 | Disposition: A | Discharge status: Home / Self Care | Payer: Worker's Compensation | Attending: Emergency Medicine | Admitting: Emergency Medicine

## 2015-05-04 DIAGNOSIS — Y990 Civilian activity done for income or pay: Secondary | ICD-10-CM | POA: Diagnosis not present

## 2015-05-04 DIAGNOSIS — N182 Chronic kidney disease, stage 2 (mild): Secondary | ICD-10-CM | POA: Insufficient documentation

## 2015-05-04 DIAGNOSIS — W1839XA Other fall on same level, initial encounter: Secondary | ICD-10-CM | POA: Insufficient documentation

## 2015-05-04 DIAGNOSIS — Z8619 Personal history of other infectious and parasitic diseases: Secondary | ICD-10-CM | POA: Diagnosis not present

## 2015-05-04 DIAGNOSIS — T1490XA Injury, unspecified, initial encounter: Secondary | ICD-10-CM

## 2015-05-04 DIAGNOSIS — Z8719 Personal history of other diseases of the digestive system: Secondary | ICD-10-CM | POA: Insufficient documentation

## 2015-05-04 DIAGNOSIS — S76311A Strain of muscle, fascia and tendon of the posterior muscle group at thigh level, right thigh, initial encounter: Secondary | ICD-10-CM | POA: Insufficient documentation

## 2015-05-04 DIAGNOSIS — S79921A Unspecified injury of right thigh, initial encounter: Secondary | ICD-10-CM | POA: Diagnosis present

## 2015-05-04 DIAGNOSIS — Z862 Personal history of diseases of the blood and blood-forming organs and certain disorders involving the immune mechanism: Secondary | ICD-10-CM | POA: Insufficient documentation

## 2015-05-04 DIAGNOSIS — I129 Hypertensive chronic kidney disease with stage 1 through stage 4 chronic kidney disease, or unspecified chronic kidney disease: Secondary | ICD-10-CM | POA: Insufficient documentation

## 2015-05-04 DIAGNOSIS — E119 Type 2 diabetes mellitus without complications: Secondary | ICD-10-CM | POA: Diagnosis not present

## 2015-05-04 DIAGNOSIS — S76301A Unspecified injury of muscle, fascia and tendon of the posterior muscle group at thigh level, right thigh, initial encounter: Secondary | ICD-10-CM

## 2015-05-04 DIAGNOSIS — Z794 Long term (current) use of insulin: Secondary | ICD-10-CM | POA: Insufficient documentation

## 2015-05-04 DIAGNOSIS — Z79899 Other long term (current) drug therapy: Secondary | ICD-10-CM | POA: Diagnosis not present

## 2015-05-04 DIAGNOSIS — E785 Hyperlipidemia, unspecified: Secondary | ICD-10-CM | POA: Insufficient documentation

## 2015-05-04 DIAGNOSIS — H9192 Unspecified hearing loss, left ear: Secondary | ICD-10-CM | POA: Diagnosis not present

## 2015-05-04 DIAGNOSIS — Y9289 Other specified places as the place of occurrence of the external cause: Secondary | ICD-10-CM | POA: Insufficient documentation

## 2015-05-04 DIAGNOSIS — Y9389 Activity, other specified: Secondary | ICD-10-CM | POA: Diagnosis not present

## 2015-05-04 DIAGNOSIS — F17210 Nicotine dependence, cigarettes, uncomplicated: Secondary | ICD-10-CM | POA: Diagnosis not present

## 2015-05-04 MED ORDER — OXYCODONE-ACETAMINOPHEN 5-325 MG PO TABS: 1.0000 | ORAL_TABLET | Freq: Four times a day (QID) | ORAL | Status: DC | PRN

## 2015-05-04 MED ORDER — CYCLOBENZAPRINE HCL 5 MG PO TABS: 5.0000 mg | ORAL_TABLET | Freq: Three times a day (TID) | ORAL | Status: DC | PRN

## 2015-05-04 MED ORDER — OXYCODONE-ACETAMINOPHEN 5-325 MG PO TABS
1.0000 | ORAL_TABLET | Freq: Once | ORAL | Status: AC
  Administered 2015-05-04: 1 via ORAL
  Filled 2015-05-04: qty 1

## 2015-05-04 NOTE — ED Notes (Signed)
Pt states that he was at work tonight and fell in between two trucks; pt states that he tried to catch himself by putting his rt leg on the other truck; pt states that he has pain to the back on rt leg; pt c/o muscular pain to rt thigh upon movement; pt c/o increase pain and pain radiating to rt foot upon standing; back side of leg tender to palpitation; + pulse to rt popliteal; + sensation

## 2015-05-04 NOTE — ED Provider Notes (Signed)
CSN: [id number]     Arrival date & time 05/04/15  1831 History  By signing my name below, I, [name], attest that this documentation has been prepared under the direction and in the presence of Junius Creamer, NP-C. Electronically Signed: Irene Vazquez, ED Scribe. 05/04/2015. 8:46 PM.  Chief Complaint  Patient presents with  . Leg Pain   The history is provided by the patient. No language interpreter was used.   HPI Comments: [name] is a 45 y.o. male with a hx of Type II DM, CKD, and HTN who presents to the Emergency Department complaining of posterior right leg pain onset PTA. Pt reports falling between two trucks at work and attempted to catch himself on the other truck with his right leg, falling backwards. He is complaining of gradually worsening, "pulling" right thigh pain that radiates to the right foot with movement and weightbearing. He reports the worst pain to his posterior thigh. Pt was given Percocet in triage, and pain has yet to cease. Pt denies numbness or weakness. PCP: Purvis Kilts, MD  Past Medical History  Diagnosis Date  . DM2 (diabetes mellitus, type 2) ([place])   . Acid reflux   . CKD (chronic kidney disease), stage II   . Hyperlipidemia   . Thrombocytopenia ([place])   . HTN (hypertension)   . History of influenza 04/18/2011  . Tobacco abuse 04/18/2011  . Deafness     Left ear only. Congenital.  . Diabetes mellitus   . Vitamin B12 deficiency 04/19/2011   Past Surgical History  Procedure Laterality Date  . Infusion pump implantation      insulin  . Hemorrhoid surgery     Family History  Problem Relation Age of Onset  . Hypertension Mother   . Hypertension Father    Social History  Substance Use Topics  . Smoking status: Current Every Day Smoker -- 1.50 packs/day for 27 years    Types: Cigarettes  . Smokeless tobacco: None  . Alcohol Use: Yes     Comment: occasionally    Review of Systems  Musculoskeletal: Positive for arthralgias.  Negative for joint swelling.  Skin: Negative for wound.  Neurological: Negative for weakness and numbness.  All other systems reviewed and are negative.  Allergies  Codeine and Glucophage  Home Medications   Prior to Admission medications   Medication Sig Start Date End Date Taking? Authorizing Provider  cyclobenzaprine (FLEXERIL) 5 MG tablet Take 1 tablet (5 mg total) by mouth 3 (three) times daily as needed for muscle spasms. 05/04/15   Junius Creamer, NP  lisinopril (PRINIVIL,ZESTRIL) 5 MG tablet Take 5 mg by mouth every morning.     Historical Provider, MD  meclizine (ANTIVERT) 25 MG tablet Take 1 tablet (25 mg total) by mouth 3 (three) times daily as needed for dizziness. 02/18/15   Evalee Jefferson, PA-C  Multiple Vitamins-Minerals (CENTRUM PO) Take 1 tablet by mouth daily.    Historical Provider, MD  NOVOLOG 100 UNIT/ML injection Inject into the skin continuous. INSULIN PUMP (TOTAL of 200 units subcutaneously over a course of 3 days) 01/18/13   Historical Provider, MD  oxyCODONE-acetaminophen (PERCOCET/ROXICET) 5-325 MG tablet Take 1 tablet by mouth every 6 (six) hours as needed for severe pain. 05/04/15   Junius Creamer, NP  simvastatin (ZOCOR) 5 MG tablet Take 5 mg by mouth every morning.     Historical Provider, MD   BP 120/83 mmHg  Pulse 100  Temp(Src) 98 F (36.7 C) (Oral)  Resp 16  SpO2 96% Physical Exam  Constitutional: He appears well-developed and well-nourished.  HENT:  Head: Normocephalic.  Eyes: Pupils are equal, round, and reactive to light.  Neck: Normal range of motion.  Cardiovascular: Normal rate.   Musculoskeletal: He exhibits tenderness. He exhibits no edema.       Legs: Neurological: He is alert.  Skin: Skin is warm.  Nursing note and vitals reviewed.   ED Course  Procedures (including critical care time) DIAGNOSTIC STUDIES: Oxygen Saturation is 96% on RA, normal by my interpretation.    COORDINATION OF CARE: 8:46 PM-Discussed treatment plan which includes  muscle relaxants, anti-inflammatories, icing the area and work note with pt at bedside and pt agreed to plan.    Labs Review Labs Reviewed - No data to display  Imaging Review No results found. I have personally reviewed and evaluated these images and lab results as part of my medical decision-making.   EKG Interpretation None     Patients. I is soft non-bruised at this point I am not concern for compartment syndrome we will prescribe pain control anti-inflammatory and muscle relaxer as well as ice and elevation will apply knees sleeve on thigh to apply some compression  MDM   Final diagnoses:  Hamstring injury, right, initial encounter       Junius Creamer, NP 05/04/15 2102  Junius Creamer, NP 05/04/15 [id number]  Quintella Reichert, MD 05/05/15 1245

## 2015-05-10 ENCOUNTER — Encounter (HOSPITAL_COMMUNITY): Payer: Self-pay

## 2015-05-10 ENCOUNTER — Emergency Department (HOSPITAL_COMMUNITY): Payer: Worker's Compensation

## 2015-05-10 ENCOUNTER — Emergency Department (HOSPITAL_COMMUNITY)
Admission: EM | Admit: 2015-05-10 | Discharge: 2015-05-10 | Disposition: A | Discharge status: Home / Self Care | Payer: Worker's Compensation | Attending: Emergency Medicine | Admitting: Emergency Medicine

## 2015-05-10 DIAGNOSIS — F17210 Nicotine dependence, cigarettes, uncomplicated: Secondary | ICD-10-CM | POA: Insufficient documentation

## 2015-05-10 DIAGNOSIS — Z79899 Other long term (current) drug therapy: Secondary | ICD-10-CM | POA: Insufficient documentation

## 2015-05-10 DIAGNOSIS — N182 Chronic kidney disease, stage 2 (mild): Secondary | ICD-10-CM | POA: Diagnosis not present

## 2015-05-10 DIAGNOSIS — E119 Type 2 diabetes mellitus without complications: Secondary | ICD-10-CM | POA: Diagnosis not present

## 2015-05-10 DIAGNOSIS — Z8719 Personal history of other diseases of the digestive system: Secondary | ICD-10-CM | POA: Diagnosis not present

## 2015-05-10 DIAGNOSIS — E785 Hyperlipidemia, unspecified: Secondary | ICD-10-CM | POA: Diagnosis not present

## 2015-05-10 DIAGNOSIS — Z862 Personal history of diseases of the blood and blood-forming organs and certain disorders involving the immune mechanism: Secondary | ICD-10-CM | POA: Diagnosis not present

## 2015-05-10 DIAGNOSIS — X58XXXD Exposure to other specified factors, subsequent encounter: Secondary | ICD-10-CM | POA: Diagnosis not present

## 2015-05-10 DIAGNOSIS — I129 Hypertensive chronic kidney disease with stage 1 through stage 4 chronic kidney disease, or unspecified chronic kidney disease: Secondary | ICD-10-CM | POA: Diagnosis not present

## 2015-05-10 DIAGNOSIS — Z8709 Personal history of other diseases of the respiratory system: Secondary | ICD-10-CM | POA: Insufficient documentation

## 2015-05-10 DIAGNOSIS — S8991XD Unspecified injury of right lower leg, subsequent encounter: Secondary | ICD-10-CM | POA: Diagnosis present

## 2015-05-10 DIAGNOSIS — H9192 Unspecified hearing loss, left ear: Secondary | ICD-10-CM | POA: Insufficient documentation

## 2015-05-10 DIAGNOSIS — S76301D Unspecified injury of muscle, fascia and tendon of the posterior muscle group at thigh level, right thigh, subsequent encounter: Secondary | ICD-10-CM

## 2015-05-10 DIAGNOSIS — S76311D Strain of muscle, fascia and tendon of the posterior muscle group at thigh level, right thigh, subsequent encounter: Secondary | ICD-10-CM | POA: Diagnosis not present

## 2015-05-10 MED ORDER — HYDROCODONE-ACETAMINOPHEN 5-325 MG PO TABS: 1.0000 | ORAL_TABLET | ORAL | Status: DC | PRN

## 2015-05-10 MED ORDER — CYCLOBENZAPRINE HCL 10 MG PO TABS: 10.0000 mg | ORAL_TABLET | Freq: Two times a day (BID) | ORAL | Status: DC | PRN

## 2015-05-10 NOTE — ED Notes (Signed)
Pt presents with c/o follow up for a leg injury that occurred at work last Friday, 1/6. Pt reports that he was at work and injured his leg as he was moving from one trailer to another. Pt was seen after the injury and diagnosed with a sprain, reports that work told him to come back for another follow-up because the pain is not getting any better.

## 2015-05-10 NOTE — ED Provider Notes (Signed)
CSN: [id number]     Arrival date & time 05/10/15  1152 History  By signing my name below, I, Essence Howell, attest that this documentation has been prepared under the direction and in the presence of Delos Haring, PA-C Electronically Signed: Ladene Artist, ED Scribe 05/11/2015 at 9:47 PM.   Chief Complaint  Patient presents with  . Leg Injury   The history is provided by the patient. No language interpreter was used.   HPI Comments: ORVIS RUDELL is a 45 y.o. male who presents to the Emergency Department complaining of constant, unchanged right leg pain onset 6 days ago. Pt reports a leg injury sustained 6 days ago while at work while moving from one trailer to another. Pt was seen in the ED following the incident and diagnosed with a hamstring sprain, no imaging was done at the time. Today, he reports persistent pain that is exacerbated with movement, bending, palpation and sitting. He has tried Percocet and Flexeril mild relief. His job sent him in since he has been unable to perform his job duties. NO new symptoms or injuries.  Past Medical History  Diagnosis Date  . DM2 (diabetes mellitus, type 2) ([place])   . Acid reflux   . CKD (chronic kidney disease), stage II   . Hyperlipidemia   . Thrombocytopenia ([place])   . HTN (hypertension)   . History of influenza 04/18/2011  . Tobacco abuse 04/18/2011  . Deafness     Left ear only. Congenital.  . Diabetes mellitus   . Vitamin B12 deficiency 04/19/2011   Past Surgical History  Procedure Laterality Date  . Infusion pump implantation      insulin  . Hemorrhoid surgery     Family History  Problem Relation Age of Onset  . Hypertension Mother   . Hypertension Father    Social History  Substance Use Topics  . Smoking status: Current Every Day Smoker -- 1.50 packs/day for 27 years    Types: Cigarettes  . Smokeless tobacco: None  . Alcohol Use: Yes     Comment: occasionally    Review of Systems  Musculoskeletal: Positive for  myalgias.  All other systems reviewed and are negative.  Allergies  Codeine and Glucophage  Home Medications   Prior to Admission medications   Medication Sig Start Date End Date Taking? Authorizing Provider  cyclobenzaprine (FLEXERIL) 10 MG tablet Take 1 tablet (10 mg total) by mouth 2 (two) times daily as needed for muscle spasms. 05/10/15   Jady Braggs Carlota Raspberry, PA-C  cyclobenzaprine (FLEXERIL) 5 MG tablet Take 1 tablet (5 mg total) by mouth 3 (three) times daily as needed for muscle spasms. 05/04/15   Junius Creamer, NP  HYDROcodone-acetaminophen (NORCO/VICODIN) 5-325 MG tablet Take 1-2 tablets by mouth every 4 (four) hours as needed. 05/10/15   Khani Paino Carlota Raspberry, PA-C  lisinopril (PRINIVIL,ZESTRIL) 5 MG tablet Take 5 mg by mouth every morning.     Historical Provider, MD  meclizine (ANTIVERT) 25 MG tablet Take 1 tablet (25 mg total) by mouth 3 (three) times daily as needed for dizziness. 02/18/15   Evalee Jefferson, PA-C  Multiple Vitamins-Minerals (CENTRUM PO) Take 1 tablet by mouth daily.    Historical Provider, MD  NOVOLOG 100 UNIT/ML injection Inject into the skin continuous. INSULIN PUMP (TOTAL of 200 units subcutaneously over a course of 3 days) 01/18/13   Historical Provider, MD  oxyCODONE-acetaminophen (PERCOCET/ROXICET) 5-325 MG tablet Take 1 tablet by mouth every 6 (six) hours as needed for severe pain. 05/04/15   Baker Janus  Olean Ree, NP  simvastatin (ZOCOR) 5 MG tablet Take 5 mg by mouth every morning.     Historical Provider, MD   BP 129/92 mmHg  Pulse 92  Temp(Src) 98 F (36.7 C) (Oral)  Resp 20  SpO2 98% Physical Exam  Constitutional: He is oriented to person, place, and time. He appears well-developed and well-nourished. No distress.  HENT:  Head: Normocephalic and atraumatic.  Eyes: Conjunctivae and EOM are normal.  Neck: Neck supple. No tracheal deviation present.  Cardiovascular: Normal rate.   Pulmonary/Chest: Effort normal. No respiratory distress.  Musculoskeletal: Normal range of motion.   Tenderness to mid and distal right hamstring. He is able to bear weight, no obvious deformities, physiologic strength. No hip pain or decreased range of motion. Symmetrical pulses.  Neurological: He is alert and oriented to person, place, and time.  Skin: Skin is warm and dry.  Psychiatric: He has a normal mood and affect. His behavior is normal.  Nursing note and vitals reviewed.  ED Course  Procedures (including critical care time) DIAGNOSTIC STUDIES: Oxygen Saturation is 98% on RA, normal by my interpretation.    COORDINATION OF CARE: 2:50 PM-Discussed treatment plan which includes XR, Vicodin and f/u with ortho with pt at bedside and pt agreed to plan.   Labs Review Labs Reviewed - No data to display  Imaging Review Dg Femur, Min 2 Views Right  05/10/2015  CLINICAL DATA:  Status post fall off a truck are 05/04/2015 continued right upper leg pain. Initial encounter. EXAM: RIGHT FEMUR 2 VIEWS COMPARISON:  None. FINDINGS: There is no evidence of fracture or other focal bone lesions. Soft tissues are unremarkable. IMPRESSION: Normal exam. Electronically Signed   By: [name] Rise M.D.   On: 05/10/2015 14:31   I have personally reviewed and evaluated these images and lab results as part of my medical decision-making.   EKG Interpretation None      MDM   Final diagnoses:  Hamstring injury, right, subsequent encounter    Patient X-Ray negative for obvious fracture or dislocation of right femur.  Pt advised to follow up with orthopedics. Patient given ice and pain medication while in ED, conservative therapy recommended and discussed. Patient will be discharged home & is agreeable with above plan. Returns precautions discussed. Pt appears safe for discharge.   I personally performed the services described in this documentation, which was scribed in my presence. The recorded information has been reviewed and is accurate.   Delos Haring, PA-C 05/11/15 2147  Gareth Morgan, MD 05/12/15 1527

## 2015-05-10 NOTE — Discharge Instructions (Signed)
Hamstring Strain A hamstring strain is an injury that occurs when the hamstring muscles are overstretched or overloaded. The hamstring muscles are a group of muscles at the back of the thighs. These muscles are used in straightening the hips, bending the knees, and pulling back the legs. This type of injury is often called a pulled hamstring muscle. The severity of a muscle strain is rated in degrees. First-degree strains have the least amount of muscle fiber tearing and pain. Second-degree and third-degree strains have increasingly more tearing and pain. CAUSES Hamstring strains occur when a sudden, violent force is placed on these muscles and stretches them too far. This often occurs during activities that involve running, jumping, kicking, or weight lifting. RISK FACTORS Hamstring strains are especially common in athletes. Other things that can increase your risk for this injury include:  Having low strength, endurance, or flexibility of the hamstring muscles.  Performing high-impact physical activity.  Having poor physical fitness.  Having a previous leg injury.  Having fatigued muscles.  Older age. SIGNS AND SYMPTOMS  Pain in the back of the thigh.  Bruising.  Swelling.  Muscle spasm.  Difficulty using the muscle because of pain or lack of normal function. For severe strains, you may have a popping or snapping feeling when the injury occurs. DIAGNOSIS Your health care provider will perform a physical exam and ask about your medical history.  TREATMENT Often, the best treatment for a hamstring strain is protecting, resting, icing, applying compression, and elevating the injured area. This is referred to as the PRICE method of treatment. Your health care provider may also recommend medicines to help reduce pain or inflammation. HOME CARE INSTRUCTIONS  Use the PRICE method of treatment to promote muscle healing during the first 2-3 days after your injury. The PRICE method  involves:  P--Protecting the muscle from being injured again.  R--Restricting your activity and resting the injured body part.  I--Icing your injury. To do this, put ice in a plastic bag. Place a towel between your skin and the bag. Then, apply the ice and leave it on for 20 minutes, 2-3 times per day. After the third day, switch to moist heat packs.  C--Applying compression to the injured area with an elastic bandage. Be careful not to wrap it too tightly. That may interfere with blood circulation or may increase swelling.  E--Elevating the injured body part above the level of your heart as often as you can. You can do this by putting a pillow under your thigh when you sit or lie down.  Take medicines only as directed by your health care provider.  Begin exercising or stretching as directed by your health care provider.  Do not return to full activity level until your health care provider approves.  Keep all follow-up visits as directed by your health care provider. This is important. SEEK MEDICAL CARE IF:  You have increasing pain or swelling in the injured area.  You have numbness, tingling, or a significant loss of strength in the injured area.  Your foot or your toes become cold or turn blue.   This information is not intended to replace advice given to you by your health care provider. Make sure you discuss any questions you have with your health care provider.   Document Released: 01/07/2001 Document Revised: 05/05/2014 Document Reviewed: 11/28/2013 Elsevier Interactive Patient Education 2016 [employer]  

## 2015-12-17 ENCOUNTER — Encounter (HOSPITAL_COMMUNITY): Payer: Self-pay | Admitting: Emergency Medicine

## 2015-12-17 ENCOUNTER — Emergency Department (HOSPITAL_COMMUNITY)
Admission: EM | Admit: 2015-12-17 | Discharge: 2015-12-18 | Disposition: A | Discharge status: Home / Self Care | Payer: Managed Care, Other (non HMO) | Attending: Emergency Medicine | Admitting: Emergency Medicine

## 2015-12-17 DIAGNOSIS — F17210 Nicotine dependence, cigarettes, uncomplicated: Secondary | ICD-10-CM | POA: Insufficient documentation

## 2015-12-17 DIAGNOSIS — I129 Hypertensive chronic kidney disease with stage 1 through stage 4 chronic kidney disease, or unspecified chronic kidney disease: Secondary | ICD-10-CM | POA: Insufficient documentation

## 2015-12-17 DIAGNOSIS — Z79899 Other long term (current) drug therapy: Secondary | ICD-10-CM | POA: Insufficient documentation

## 2015-12-17 DIAGNOSIS — K047 Periapical abscess without sinus: Secondary | ICD-10-CM

## 2015-12-17 DIAGNOSIS — N182 Chronic kidney disease, stage 2 (mild): Secondary | ICD-10-CM | POA: Insufficient documentation

## 2015-12-17 DIAGNOSIS — E1122 Type 2 diabetes mellitus with diabetic chronic kidney disease: Secondary | ICD-10-CM | POA: Insufficient documentation

## 2015-12-17 DIAGNOSIS — Z794 Long term (current) use of insulin: Secondary | ICD-10-CM | POA: Insufficient documentation

## 2015-12-17 DIAGNOSIS — H9201 Otalgia, right ear: Secondary | ICD-10-CM | POA: Insufficient documentation

## 2015-12-17 HISTORY — DX: Essential (primary) hypertension: I10

## 2015-12-17 LAB — RAPID STREP SCREEN (MED CTR MEBANE ONLY): Streptococcus, Group A Screen (Direct): NEGATIVE

## 2015-12-17 LAB — BASIC METABOLIC PANEL
Anion gap: 5 (ref 5–15)
BUN: 19 mg/dL (ref 6–20)
CALCIUM: 8.9 mg/dL (ref 8.9–10.3)
CHLORIDE: 107 mmol/L (ref 101–111)
CO2: 24 mmol/L (ref 22–32)
CREATININE: 1.11 mg/dL (ref 0.61–1.24)
GFR calc non Af Amer: >60 mL/min (ref >60)
Glucose, Bld: 149 mg/dL — ABNORMAL HIGH (ref 65–99)
Potassium: 3.8 mmol/L (ref 3.5–5.1)
SODIUM: 136 mmol/L (ref 135–145)

## 2015-12-17 LAB — CBC WITH DIFFERENTIAL/PLATELET
Basophils Absolute: 0.1 10*3/uL (ref 0.0–0.1)
Basophils Relative: 1 %
Eosinophils Absolute: 0.1 10*3/uL (ref 0.0–0.7)
Eosinophils Relative: 1 %
HEMATOCRIT: 44.9 % (ref 39.0–52.0)
Hemoglobin: 15.8 g/dL (ref 13.0–17.0)
LYMPHS PCT: 18 %
Lymphs Abs: 2.7 10*3/uL (ref 0.7–4.0)
MCH: 30.7 pg (ref 26.0–34.0)
MCHC: 35.2 g/dL (ref 30.0–36.0)
MCV: 87.4 fL (ref 78.0–100.0)
MONO ABS: 1.3 10*3/uL — AB (ref 0.1–1.0)
MONOS PCT: 9 %
NEUTROS ABS: 10.8 10*3/uL — AB (ref 1.7–7.7)
Neutrophils Relative %: 71 %
Platelets: 160 10*3/uL (ref 150–400)
RBC: 5.14 MIL/uL (ref 4.22–5.81)
RDW: 12.2 % (ref 11.5–15.5)
WBC: 15 10*3/uL — ABNORMAL HIGH (ref 4.0–10.5)

## 2015-12-17 NOTE — ED Triage Notes (Signed)
Pt c/o fever, chills, right ear pain and sore throat. Pt had tooth pulled today on the right side.

## 2015-12-18 LAB — CBG MONITORING, ED: GLUCOSE-CAPILLARY: 174 mg/dL — AB (ref 65–99)

## 2015-12-18 MED ORDER — CLINDAMYCIN PHOSPHATE 600 MG/50ML IV SOLN
600.0000 mg | Freq: Once | INTRAVENOUS | Status: AC
  Administered 2015-12-18: 600 mg via INTRAVENOUS
  Filled 2015-12-18: qty 50

## 2015-12-18 MED ORDER — SODIUM CHLORIDE 0.9 % IV BOLUS (SEPSIS)
500.0000 mL | Freq: Once | INTRAVENOUS | Status: AC
  Administered 2015-12-18: 500 mL via INTRAVENOUS

## 2015-12-18 MED ORDER — SODIUM CHLORIDE 0.9 % IV BOLUS (SEPSIS)
1000.0000 mL | Freq: Once | INTRAVENOUS | Status: AC
  Administered 2015-12-18: 1000 mL via INTRAVENOUS

## 2015-12-18 MED ORDER — CLINDAMYCIN HCL 300 MG PO CAPS: 300.0000 mg | ORAL_CAPSULE | Freq: Four times a day (QID) | ORAL | 0 refills | Status: DC

## 2015-12-18 NOTE — ED Provider Notes (Signed)
[facility] DEPT Provider Note   CSN: [id number] Arrival date & time: 12/17/15  2116   Time seen 01:52 AM  History   Chief Complaint Chief Complaint  Patient presents with  . Fever    HPI [name] is a 45 y.o. male.  HPI  Patient reports he had had pain in his right lower wisdom tooth for about 2 weeks. He also has been having right earache for the past week. He states about 2:30 PM earlier today he had his right lower wisdom tooth extracted at the dentist office. He states he's had mild bleeding. However about 6:30 PM he had some sore throat which is now improved. He states he had chills at home that lasted about 45 minutes. They did not check his temperature. He states that his ear hurts more with swallowing however he is able to swallow now. He is not having drainage from his ear. He does not have a history of ear problems.  Patient has diabetes and he states his blood sugars usually run 150-160 however his wife states his blood sugar can jump up to 500 or get too low.  PCP Dr. Hilma Favors  Past Medical History:  Diagnosis Date  . Acid reflux   . CKD (chronic kidney disease), stage II   . Deafness    Left ear only. Congenital.  . Diabetes mellitus   . DM2 (diabetes mellitus, type 2) ([place])   . History of influenza 04/18/2011  . Hyperlipidemia   . Hypertension   . Thrombocytopenia ([place])   . Tobacco abuse 04/18/2011  . Vitamin B12 deficiency 04/19/2011    Patient Active Problem List   Diagnosis Date Noted  . Pain in limb 08/02/2013  . Salmonella gastroenteritis 02/10/2013  . Gastroenteritis 02/09/2013  . Fever 02/08/2013  . Nausea, vomiting and diarrhea 02/08/2013  . Wheezing 02/06/2012  . Acute bronchitis 02/04/2012  . DM type 2 (diabetes mellitus, type 2) ([place]) 02/04/2012  . Insulin pump in place 02/04/2012  . Dehydration 02/04/2012  . Orthostatic hypotension 02/04/2012  . Hypoglycemia 04/19/2011  . Vitamin B12 deficiency 04/19/2011  . DKA, type 2 ([place])  04/18/2011  . Hyponatremia 04/18/2011  . Acute renal failure ([place]) 04/18/2011  . CKD (chronic kidney disease), stage II 04/18/2011  . Tachycardia 04/18/2011  . Thrombocytopenia ([place]) 04/18/2011  . History of influenza 04/18/2011  . Tobacco abuse 04/18/2011  . Nausea and vomiting in adult 04/18/2011    Past Surgical History:  Procedure Laterality Date  . HEMORRHOID SURGERY    . INFUSION PUMP IMPLANTATION     insulin       Home Medications    Protonix  Prior to Admission medications   Medication Sig Start Date End Date Taking? Authorizing Provider  lisinopril (PRINIVIL,ZESTRIL) 5 MG tablet Take 5 mg by mouth every morning.    Yes Historical Provider, MD  Multiple Vitamins-Minerals (CENTRUM PO) Take 1 tablet by mouth daily.   Yes Historical Provider, MD  NOVOLOG 100 UNIT/ML injection Inject into the skin continuous. INSULIN PUMP (TOTAL of 200 units subcutaneously over a course of 3 days) 01/18/13  Yes Historical Provider, MD  simvastatin (ZOCOR) 5 MG tablet Take 5 mg by mouth every morning.    Yes Historical Provider, MD  clindamycin (CLEOCIN) 300 MG capsule Take 1 capsule (300 mg total) by mouth every 6 (six) hours. 12/18/15   Rolland Porter, MD  cyclobenzaprine (FLEXERIL) 10 MG tablet Take 1 tablet (10 mg total) by mouth 2 (two) times daily as needed for  muscle spasms. 05/10/15   Tiffany Carlota Raspberry, PA-C  cyclobenzaprine (FLEXERIL) 5 MG tablet Take 1 tablet (5 mg total) by mouth 3 (three) times daily as needed for muscle spasms. 05/04/15   Junius Creamer, NP  HYDROcodone-acetaminophen (NORCO/VICODIN) 5-325 MG tablet Take 1-2 tablets by mouth every 4 (four) hours as needed. 05/10/15   Tiffany Carlota Raspberry, PA-C  meclizine (ANTIVERT) 25 MG tablet Take 1 tablet (25 mg total) by mouth 3 (three) times daily as needed for dizziness. 02/18/15   Evalee Jefferson, PA-C  oxyCODONE-acetaminophen (PERCOCET/ROXICET) 5-325 MG tablet Take 1 tablet by mouth every 6 (six) hours as needed for severe pain. 05/04/15   Junius Creamer,  NP    Family History Family History  Problem Relation Age of Onset  . Hypertension Mother   . Hypertension Father     Social History Social History  Substance Use Topics  . Smoking status: Current Every Day Smoker    Packs/day: 1.50    Years: 27.00    Types: Cigarettes  . Smokeless tobacco: Never Used  . Alcohol use Yes     Comment: occasionally  employed   Allergies   Codeine and Glucophage [metformin hydrochloride]   Review of Systems Review of Systems  All other systems reviewed and are negative.    Physical Exam Updated Vital Signs BP 113/79 (BP Location: Right Arm)   Pulse 83   Temp 98.6 F (37 C) (Oral)   Resp 20   Ht 5\' 6"  (1.676 m)   Wt 180 lb (81.6 kg)   SpO2 100%   BMI 29.05 kg/m   Vital signs normal    Physical Exam  Constitutional: He is oriented to person, place, and time. He appears well-developed and well-nourished.  Non-toxic appearance. He does not appear ill. No distress.  HENT:  Head: Normocephalic and atraumatic.  Right Ear: External ear normal.  Left Ear: External ear normal.  Nose: Nose normal. No mucosal edema or rhinorrhea.  Mouth/Throat: Mucous membranes are normal. No dental abscesses or uvula swelling.  When I examine his mouth there is no bleeding seen. The area where his tooth was extracted is not gaping open. It appears to me that tooth may be had been impacted. He is noted to have some mild swelling in the inner oral pharynx on the right side. There is no soft palate swelling. There is no enlargement of tonsils or tonsillar exudate. His uvula is midline. He does appear to have some mild swelling when I look at his face on the right side.  Both TMs are normal bilaterally, ear canals are normal bilaterally  Eyes: Conjunctivae and EOM are normal. Pupils are equal, round, and reactive to light.  Neck: Normal range of motion and full passive range of motion without pain. Neck supple.  Cardiovascular: Normal rate, regular rhythm  and normal heart sounds.  Exam reveals no gallop and no friction rub.   No murmur heard. Pulmonary/Chest: Effort normal and breath sounds normal. No respiratory distress. He has no wheezes. He has no rhonchi. He has no rales. He exhibits no tenderness and no crepitus.  Abdominal: Soft. Normal appearance and bowel sounds are normal. He exhibits no distension. There is no tenderness. There is no rebound and no guarding.  Musculoskeletal: Normal range of motion. He exhibits no edema or tenderness.  Moves all extremities well.   Neurological: He is alert and oriented to person, place, and time. He has normal strength. No cranial nerve deficit.  Skin: Skin is warm, dry and intact. No  rash noted. No erythema. No pallor.  Psychiatric: He has a normal mood and affect. His speech is normal and behavior is normal. His mood appears not anxious.  Nursing note and vitals reviewed.    ED Treatments / Results  Labs (all labs ordered are listed, but only abnormal results are displayed) Results for orders placed or performed during the hospital encounter of 12/17/15  Rapid strep screen  Result Value Ref Range   Streptococcus, Group A Screen (Direct) NEGATIVE NEGATIVE  CBC with Differential  Result Value Ref Range   WBC 15.0 (H) 4.0 - 10.5 K/uL   RBC 5.14 4.22 - 5.81 MIL/uL   Hemoglobin 15.8 13.0 - 17.0 g/dL   HCT 44.9 39.0 - 52.0 %   MCV 87.4 78.0 - 100.0 fL   MCH 30.7 26.0 - 34.0 pg   MCHC 35.2 30.0 - 36.0 g/dL   RDW 12.2 11.5 - 15.5 %   Platelets 160 150 - 400 K/uL   Neutrophils Relative % 71 %   Neutro Abs 10.8 (H) 1.7 - 7.7 K/uL   Lymphocytes Relative 18 %   Lymphs Abs 2.7 0.7 - 4.0 K/uL   Monocytes Relative 9 %   Monocytes Absolute 1.3 (H) 0.1 - 1.0 K/uL   Eosinophils Relative 1 %   Eosinophils Absolute 0.1 0.0 - 0.7 K/uL   Basophils Relative 1 %   Basophils Absolute 0.1 0.0 - 0.1 K/uL  Basic metabolic panel  Result Value Ref Range   Sodium 136 135 - 145 mmol/L   Potassium 3.8 3.5 -  5.1 mmol/L   Chloride 107 101 - 111 mmol/L   CO2 24 22 - 32 mmol/L   Glucose, Bld 149 (H) 65 - 99 mg/dL   BUN 19 6 - 20 mg/dL   Creatinine, Ser 1.11 0.61 - 1.24 mg/dL   Calcium 8.9 8.9 - 10.3 mg/dL   GFR calc non Af Amer >60 >60 mL/min   GFR calc Af Amer >60 >60 mL/min   Anion gap 5 5 - 15  CBG monitoring, ED  Result Value Ref Range   Glucose-Capillary 174 (H) 65 - 99 mg/dL   Laboratory interpretation all normal except For leukocytosis and mild hyperglycemia    Procedures Procedures (including critical care time)  Medications Ordered in ED Medications  sodium chloride 0.9 % bolus 1,000 mL (0 mLs Intravenous Stopped 12/18/15 0309)  sodium chloride 0.9 % bolus 500 mL (0 mLs Intravenous Stopped 12/18/15 0310)  clindamycin (CLEOCIN) IVPB 600 mg (0 mg Intravenous Stopped 12/18/15 0305)     Initial Impression / Assessment and Plan / ED Course  I have reviewed the triage vital signs and the nursing notes.  Pertinent labs & imaging results that were available during my care of the patient were reviewed by me and considered in my medical decision making (see chart for details).  Clinical Course   Patient was given IV fluids and started on IV clindamycin. We discussed his ear pain was most likely referred pain from his tooth that was causing problems. He was discharged home on clindamycin.  Final Clinical Impressions(s) / ED Diagnoses   Final diagnoses:  Dental infection  Ear pain, referred, right    New Prescriptions New Prescriptions   CLINDAMYCIN (CLEOCIN) 300 MG CAPSULE    Take 1 capsule (300 mg total) by mouth every 6 (six) hours.    Plan discharge  Rolland Porter, MD, Barbette Or, MD 12/18/15 [phone]

## 2015-12-18 NOTE — Discharge Instructions (Signed)
Take the antibiotics until gone. Monitor your blood sugar closely while you were feeling bad. The pain in your ear is what is called referred pain from ear tooth problem. Follow-up with your dentist as discussed.

## 2015-12-20 LAB — CULTURE, GROUP A STREP (THRC)

## 2016-04-07 ENCOUNTER — Ambulatory Visit (HOSPITAL_COMMUNITY)
Admission: RE | Admit: 2016-04-07 | Discharge: 2016-04-07 | Disposition: A | Discharge status: Home / Self Care | Payer: 59 | Source: Ambulatory Visit | Attending: Family Medicine | Admitting: Family Medicine

## 2016-04-07 ENCOUNTER — Other Ambulatory Visit (HOSPITAL_COMMUNITY): Payer: Self-pay | Admitting: Family Medicine

## 2016-04-07 DIAGNOSIS — J209 Acute bronchitis, unspecified: Secondary | ICD-10-CM | POA: Insufficient documentation

## 2016-04-07 DIAGNOSIS — J069 Acute upper respiratory infection, unspecified: Secondary | ICD-10-CM | POA: Insufficient documentation

## 2017-03-16 ENCOUNTER — Emergency Department (HOSPITAL_COMMUNITY): Payer: No Typology Code available for payment source

## 2017-03-16 ENCOUNTER — Observation Stay (HOSPITAL_COMMUNITY)
Admission: EM | Admit: 2017-03-16 | Discharge: 2017-03-18 | Disposition: A | Discharge status: Home / Self Care | Payer: No Typology Code available for payment source | Attending: Internal Medicine | Admitting: Internal Medicine

## 2017-03-16 ENCOUNTER — Encounter (HOSPITAL_COMMUNITY): Payer: Self-pay

## 2017-03-16 ENCOUNTER — Other Ambulatory Visit: Payer: Self-pay

## 2017-03-16 DIAGNOSIS — I129 Hypertensive chronic kidney disease with stage 1 through stage 4 chronic kidney disease, or unspecified chronic kidney disease: Secondary | ICD-10-CM | POA: Insufficient documentation

## 2017-03-16 DIAGNOSIS — K219 Gastro-esophageal reflux disease without esophagitis: Secondary | ICD-10-CM | POA: Diagnosis not present

## 2017-03-16 DIAGNOSIS — Z9641 Presence of insulin pump (external) (internal): Secondary | ICD-10-CM | POA: Diagnosis not present

## 2017-03-16 DIAGNOSIS — R0902 Hypoxemia: Secondary | ICD-10-CM | POA: Diagnosis present

## 2017-03-16 DIAGNOSIS — Z794 Long term (current) use of insulin: Secondary | ICD-10-CM | POA: Insufficient documentation

## 2017-03-16 DIAGNOSIS — J470 Bronchiectasis with acute lower respiratory infection: Principal | ICD-10-CM | POA: Insufficient documentation

## 2017-03-16 DIAGNOSIS — N182 Chronic kidney disease, stage 2 (mild): Secondary | ICD-10-CM | POA: Insufficient documentation

## 2017-03-16 DIAGNOSIS — N179 Acute kidney failure, unspecified: Secondary | ICD-10-CM | POA: Diagnosis not present

## 2017-03-16 DIAGNOSIS — E785 Hyperlipidemia, unspecified: Secondary | ICD-10-CM | POA: Diagnosis not present

## 2017-03-16 DIAGNOSIS — E1122 Type 2 diabetes mellitus with diabetic chronic kidney disease: Secondary | ICD-10-CM | POA: Insufficient documentation

## 2017-03-16 DIAGNOSIS — J189 Pneumonia, unspecified organism: Secondary | ICD-10-CM | POA: Diagnosis not present

## 2017-03-16 DIAGNOSIS — F17210 Nicotine dependence, cigarettes, uncomplicated: Secondary | ICD-10-CM | POA: Insufficient documentation

## 2017-03-16 DIAGNOSIS — J9621 Acute and chronic respiratory failure with hypoxia: Secondary | ICD-10-CM | POA: Insufficient documentation

## 2017-03-16 DIAGNOSIS — E11649 Type 2 diabetes mellitus with hypoglycemia without coma: Secondary | ICD-10-CM | POA: Insufficient documentation

## 2017-03-16 DIAGNOSIS — IMO0001 Reserved for inherently not codable concepts without codable children: Secondary | ICD-10-CM

## 2017-03-16 DIAGNOSIS — E119 Type 2 diabetes mellitus without complications: Secondary | ICD-10-CM

## 2017-03-16 DIAGNOSIS — J4551 Severe persistent asthma with (acute) exacerbation: Secondary | ICD-10-CM | POA: Diagnosis present

## 2017-03-16 DIAGNOSIS — Z79899 Other long term (current) drug therapy: Secondary | ICD-10-CM | POA: Diagnosis not present

## 2017-03-16 LAB — CBC WITH DIFFERENTIAL/PLATELET
BASOS PCT: 1 %
Basophils Absolute: 0 10*3/uL (ref 0.0–0.1)
EOS ABS: 0.1 10*3/uL (ref 0.0–0.7)
EOS PCT: 1 %
HCT: 51.1 % (ref 39.0–52.0)
HEMOGLOBIN: 17.2 g/dL — AB (ref 13.0–17.0)
Lymphocytes Relative: 25 %
Lymphs Abs: 1.7 10*3/uL (ref 0.7–4.0)
MCH: 29.4 pg (ref 26.0–34.0)
MCHC: 33.7 g/dL (ref 30.0–36.0)
MCV: 87.2 fL (ref 78.0–100.0)
MONOS PCT: 18 %
Monocytes Absolute: 1.2 10*3/uL — ABNORMAL HIGH (ref 0.1–1.0)
NEUTROS PCT: 55 %
Neutro Abs: 3.7 10*3/uL (ref 1.7–7.7)
PLATELETS: 130 10*3/uL — AB (ref 150–400)
RBC: 5.86 MIL/uL — ABNORMAL HIGH (ref 4.22–5.81)
RDW: 12.6 % (ref 11.5–15.5)
WBC: 6.7 10*3/uL (ref 4.0–10.5)

## 2017-03-16 LAB — BASIC METABOLIC PANEL
Anion gap: 14 (ref 5–15)
Anion gap: 9 (ref 5–15)
BUN: 23 mg/dL — AB (ref 6–20)
BUN: 22 mg/dL — ABNORMAL HIGH (ref 6–20)
CO2: 23 mmol/L (ref 22–32)
CO2: 21 mmol/L — ABNORMAL LOW (ref 22–32)
Calcium: 9.2 mg/dL (ref 8.9–10.3)
Calcium: 8.5 mg/dL — ABNORMAL LOW (ref 8.9–10.3)
Chloride: 102 mmol/L (ref 101–111)
Chloride: 102 mmol/L (ref 101–111)
Creatinine, Ser: 1.34 mg/dL — ABNORMAL HIGH (ref 0.61–1.24)
Creatinine, Ser: 1.29 mg/dL — ABNORMAL HIGH (ref 0.61–1.24)
GFR calc Af Amer: >60 mL/min (ref >60)
GFR calc non Af Amer: >60 mL/min (ref >60)
GFR calc non Af Amer: >60 mL/min (ref >60)
GLUCOSE: 189 mg/dL — AB (ref 65–99)
Glucose, Bld: 446 mg/dL — ABNORMAL HIGH (ref 65–99)
Potassium: 4 mmol/L (ref 3.5–5.1)
Potassium: 4.1 mmol/L (ref 3.5–5.1)
SODIUM: 139 mmol/L (ref 135–145)
Sodium: 132 mmol/L — ABNORMAL LOW (ref 135–145)

## 2017-03-16 LAB — GLUCOSE, CAPILLARY
GLUCOSE-CAPILLARY: 441 mg/dL — AB (ref 65–99)
Glucose-Capillary: 519 mg/dL (ref 65–99)
Glucose-Capillary: 427 mg/dL — ABNORMAL HIGH (ref 65–99)
Glucose-Capillary: 283 mg/dL — ABNORMAL HIGH (ref 65–99)

## 2017-03-16 LAB — INFLUENZA PANEL BY PCR (TYPE A & B)
INFLAPCR: NEGATIVE
Influenza B By PCR: NEGATIVE

## 2017-03-16 LAB — STREP PNEUMONIAE URINARY ANTIGEN: Strep Pneumo Urinary Antigen: NEGATIVE

## 2017-03-16 LAB — BETA-HYDROXYBUTYRIC ACID: BETA-HYDROXYBUTYRIC ACID: 1.46 mmol/L — AB (ref 0.05–0.27)

## 2017-03-16 MED ORDER — ONDANSETRON HCL 4 MG/2ML IJ SOLN: 4.0000 mg | Freq: Four times a day (QID) | INTRAMUSCULAR | Status: DC | PRN

## 2017-03-16 MED ORDER — INSULIN ASPART 100 UNIT/ML ~~LOC~~ SOLN: 0.0000 [IU] | Freq: Three times a day (TID) | SUBCUTANEOUS | Status: DC

## 2017-03-16 MED ORDER — AZITHROMYCIN 250 MG PO TABS
500.0000 mg | ORAL_TABLET | ORAL | Status: DC
  Administered 2017-03-17 – 2017-03-18 (×2): 500 mg via ORAL
  Filled 2017-03-16 (×2): qty 2

## 2017-03-16 MED ORDER — ENOXAPARIN SODIUM 40 MG/0.4ML ~~LOC~~ SOLN
40.0000 mg | SUBCUTANEOUS | Status: DC
  Administered 2017-03-16: 40 mg via SUBCUTANEOUS
  Filled 2017-03-16 (×2): qty 0.4

## 2017-03-16 MED ORDER — SENNOSIDES-DOCUSATE SODIUM 8.6-50 MG PO TABS: 1.0000 | ORAL_TABLET | Freq: Every evening | ORAL | Status: DC | PRN

## 2017-03-16 MED ORDER — IPRATROPIUM-ALBUTEROL 0.5-2.5 (3) MG/3ML IN SOLN
3.0000 mL | Freq: Once | RESPIRATORY_TRACT | Status: AC
  Administered 2017-03-16: 3 mL via RESPIRATORY_TRACT
  Filled 2017-03-16: qty 3

## 2017-03-16 MED ORDER — INSULIN PUMP
Freq: Three times a day (TID) | SUBCUTANEOUS | Status: DC
  Filled 2017-03-16: qty 1

## 2017-03-16 MED ORDER — DEXTROSE 5 % IV SOLN
500.0000 mg | Freq: Once | INTRAVENOUS | Status: DC
  Administered 2017-03-16: 500 mg via INTRAVENOUS
  Filled 2017-03-16: qty 500

## 2017-03-16 MED ORDER — METHYLPREDNISOLONE SODIUM SUCC 125 MG IJ SOLR
125.0000 mg | Freq: Once | INTRAMUSCULAR | Status: AC
  Administered 2017-03-16: 125 mg via INTRAVENOUS
  Filled 2017-03-16: qty 2

## 2017-03-16 MED ORDER — DEXTROSE 5 % IV SOLN
1.0000 g | Freq: Once | INTRAVENOUS | Status: AC
  Administered 2017-03-16: 1 g via INTRAVENOUS
  Filled 2017-03-16: qty 10

## 2017-03-16 MED ORDER — ACETAMINOPHEN 325 MG PO TABS: 650.0000 mg | ORAL_TABLET | Freq: Four times a day (QID) | ORAL | Status: DC | PRN

## 2017-03-16 MED ORDER — ACETAMINOPHEN 650 MG RE SUPP: 650.0000 mg | Freq: Four times a day (QID) | RECTAL | Status: DC | PRN

## 2017-03-16 MED ORDER — LOSARTAN POTASSIUM 50 MG PO TABS
100.0000 mg | ORAL_TABLET | Freq: Every day | ORAL | Status: DC
  Administered 2017-03-17 – 2017-03-18 (×2): 100 mg via ORAL
  Filled 2017-03-16 (×2): qty 2

## 2017-03-16 MED ORDER — IOPAMIDOL (ISOVUE-370) INJECTION 76%
100.0000 mL | Freq: Once | INTRAVENOUS | Status: AC | PRN
  Administered 2017-03-16: 100 mL via INTRAVENOUS

## 2017-03-16 MED ORDER — DEXTROSE 5 % IV SOLN
1.0000 g | Freq: Once | INTRAVENOUS | Status: DC
  Filled 2017-03-16: qty 1

## 2017-03-16 MED ORDER — SODIUM CHLORIDE 0.9 % IV SOLN: INTRAVENOUS | Status: DC

## 2017-03-16 MED ORDER — DEXTROSE 5 % IV SOLN
1.0000 g | INTRAVENOUS | Status: DC
  Administered 2017-03-17: 1 g via INTRAVENOUS
  Filled 2017-03-16 (×4): qty 10

## 2017-03-16 MED ORDER — ONDANSETRON HCL 4 MG PO TABS: 4.0000 mg | ORAL_TABLET | Freq: Four times a day (QID) | ORAL | Status: DC | PRN

## 2017-03-16 MED ORDER — SODIUM CHLORIDE 0.9 % IV BOLUS (SEPSIS)
500.0000 mL | Freq: Once | INTRAVENOUS | Status: AC
  Administered 2017-03-16: 500 mL via INTRAVENOUS

## 2017-03-16 MED ORDER — SODIUM CHLORIDE 0.9 % IV BOLUS (SEPSIS)
500.0000 mL | Freq: Once | INTRAVENOUS | Status: AC
  Administered 2017-03-16: 11:00:00 via INTRAVENOUS

## 2017-03-16 MED ORDER — SODIUM CHLORIDE 0.9 % IV SOLN
INTRAVENOUS | Status: DC
  Administered 2017-03-17 (×2): via INTRAVENOUS

## 2017-03-16 MED ORDER — SODIUM CHLORIDE 0.9 % IV SOLN
INTRAVENOUS | Status: DC
  Administered 2017-03-16: 12:00:00 via INTRAVENOUS

## 2017-03-16 NOTE — Progress Notes (Signed)
CBG:@ 1835:441: As per ordered on admission, Stat Serum Ketones ordered.  Dr. Hilbert Bible notified at [phone] re: cbg:441 at she ordered BMET Stat with request to be notified of results.

## 2017-03-16 NOTE — ED Provider Notes (Signed)
[facility] EMERGENCY DEPARTMENT Provider Note   CSN: [id number] Arrival date & time: 03/16/17  8338     History   Chief Complaint No chief complaint on file.   HPI [name] is a 46 y.o. male.  Patient with complaint of cough shortness of breath since Friday.  Be getting worse.  Patient is a smoker.  Patient is diabetic and has an insulin pump.  Patient does not have a history of asthma or COPD.  But wife states he does wheeze occasionally.      Past Medical History:  Diagnosis Date  . Acid reflux   . CKD (chronic kidney disease), stage II   . Deafness    Left ear only. Congenital.  . Diabetes mellitus   . DM2 (diabetes mellitus, type 2) ([place])   . History of influenza 04/18/2011  . Hyperlipidemia   . Hypertension   . Thrombocytopenia ([place])   . Tobacco abuse 04/18/2011  . Vitamin B12 deficiency 04/19/2011    Patient Active Problem List   Diagnosis Date Noted  . Pain in limb 08/02/2013  . Salmonella gastroenteritis 02/10/2013  . Gastroenteritis 02/09/2013  . Fever 02/08/2013  . Nausea, vomiting and diarrhea 02/08/2013  . Wheezing 02/06/2012  . Acute bronchitis 02/04/2012  . DM type 2 (diabetes mellitus, type 2) ([place]) 02/04/2012  . Insulin pump in place 02/04/2012  . Dehydration 02/04/2012  . Orthostatic hypotension 02/04/2012  . Hypoglycemia 04/19/2011  . Vitamin B12 deficiency 04/19/2011  . DKA, type 2 ([place]) 04/18/2011  . Hyponatremia 04/18/2011  . Acute renal failure ([place]) 04/18/2011  . CKD (chronic kidney disease), stage II 04/18/2011  . Tachycardia 04/18/2011  . Thrombocytopenia ([place]) 04/18/2011  . History of influenza 04/18/2011  . Tobacco abuse 04/18/2011  . Nausea and vomiting in adult 04/18/2011    Past Surgical History:  Procedure Laterality Date  . HEMORRHOID SURGERY    . INFUSION PUMP IMPLANTATION     insulin       Home Medications    Prior to Admission medications   Medication Sig Start Date End Date Taking? Authorizing  Provider  losartan (COZAAR) 100 MG tablet Take 100 mg daily by mouth. 03/13/17  Yes [provider]  Multiple Vitamins-Minerals (CENTRUM PO) Take 1 tablet by mouth daily.   Yes [provider]  NOVOLOG 100 UNIT/ML injection Inject into the skin continuous. INSULIN PUMP (TOTAL of 200 units subcutaneously over a course of 3 days) 01/18/13  Yes [provider]  pantoprazole (PROTONIX) 40 MG tablet Take 40 mg daily by mouth. 02/03/17  Yes [provider]  simvastatin (ZOCOR) 20 MG tablet Take 20 mg every morning by mouth.    Yes [provider]    Family History Family History  Problem Relation Age of Onset  . Hypertension Mother   . Hypertension Father     Social History Social History   Tobacco Use  . Smoking status: Current Every Day Smoker    Packs/day: 1.50    Years: 27.00    Pack years: 40.50    Types: Cigarettes  . Smokeless tobacco: Never Used  Substance Use Topics  . Alcohol use: Yes    Comment: occasionally  . Drug use: No     Allergies   Codeine and Glucophage [metformin hydrochloride]   Review of Systems Review of Systems  Constitutional: Negative for fever.  HENT: Positive for congestion.   Eyes: Negative for redness.  Respiratory: Positive for cough, shortness of breath and wheezing.   Gastrointestinal:  Negative for abdominal pain.  Genitourinary: Negative for dysuria.  Musculoskeletal: Negative for back pain.  Skin: Negative for rash.  Neurological: Negative for headaches.  Hematological: Does not bruise/bleed easily.  Psychiatric/Behavioral: Negative for confusion.     Physical Exam Updated Vital Signs BP 107/72   Pulse (!) 101   Temp 98.3 F (36.8 C) (Oral)   Resp (!) 31   Ht 1.727 m (5\' 8" )   Wt 85.3 kg (188 lb)   SpO2 95%   BMI 28.59 kg/m   Physical Exam  Constitutional: He is oriented to person, place, and time. He appears well-developed and well-nourished. He appears distressed.  HENT:    Head: Normocephalic and atraumatic.  Mucous membranes slightly dry.  Eyes: EOM are normal. Pupils are equal, round, and reactive to light.  Neck: Normal range of motion. Neck supple.  Cardiovascular: Normal rate, regular rhythm and normal heart sounds.  Pulmonary/Chest: Effort normal. He has wheezes.  Abdominal: Soft. Bowel sounds are normal. There is no tenderness.  Musculoskeletal: Normal range of motion. He exhibits no edema.  Neurological: He is alert and oriented to person, place, and time. No cranial nerve deficit or sensory deficit. He exhibits normal muscle tone. Coordination normal.  Skin: Skin is warm.  Nursing note and vitals reviewed.    ED Treatments / Results  Labs (all labs ordered are listed, but only abnormal results are displayed) Labs Reviewed  CBC WITH DIFFERENTIAL/PLATELET - Abnormal; Notable for the following components:      Result Value   RBC 5.86 (*)    Hemoglobin 17.2 (*)    Platelets 130 (*)    Monocytes Absolute 1.2 (*)    All other components within normal limits  BASIC METABOLIC PANEL - Abnormal; Notable for the following components:   Glucose, Bld 189 (*)    BUN 23 (*)    Creatinine, Ser 1.34 (*)    All other components within normal limits    EKG  EKG Interpretation  Date/Time:  Monday March 16 2017 08:07:38 EST Ventricular Rate:  119 PR Interval:    QRS Duration: 76 QT Interval:  298 QTC Calculation: 420 R Axis:   93 Text Interpretation:  Sinus tachycardia Biatrial enlargement Probable lateral infarct, old Baseline wander in lead(s) V6 Confirmed by Fredia Sorrow [phone]) on 03/16/2017 8:52:47 AM       Radiology Dg Chest 2 View  Result Date: 03/16/2017 CLINICAL DATA:  Cough, shortness of breath. EXAM: CHEST  2 VIEW COMPARISON:  Radiographs of April 07, 2016. FINDINGS: The heart size and mediastinal contours are within normal limits. Both lungs are clear. No pneumothorax or pleural effusion is noted. The visualized skeletal  structures are unremarkable. IMPRESSION: No active cardiopulmonary disease. Electronically Signed   By: Marijo Conception, M.D.   On: 03/16/2017 08:45   Ct Angio Chest Pe W/cm &/or Wo Cm  Result Date: 03/16/2017 CLINICAL DATA:  Shortness of breath, cough, high pretest probability for PE EXAM: CT ANGIOGRAPHY CHEST WITH CONTRAST TECHNIQUE: Multidetector CT imaging of the chest was performed using the standard protocol during bolus administration of intravenous contrast. Multiplanar CT image reconstructions and MIPs were obtained to evaluate the vascular anatomy. CONTRAST:  113mL ISOVUE-370 IOPAMIDOL (ISOVUE-370) INJECTION 76% COMPARISON:  Chest radiographs dated 03/16/2017 FINDINGS: Cardiovascular: Initial evaluation of the pulmonary arteries was considered satisfactory due to bolus timing. Repeat evaluation was performed. Satisfactory evaluation of the bilateral pulmonary arteries to the segmental level. No evidence of pulmonary embolism. No evidence of thoracic aortic aneurysm or  dissection. The heart is normal in size.  No pericardial effusion. Mediastinum/Nodes: Small thoracic lymph nodes, including: --8 mm short axis high right paratracheal node (series 15/ image 31) --10 mm short axis AP window node (series 15/image 38) --13 mm short axis subcarinal node (series 15/ image 42) --9 mm short axis left hilar node (series 15/ image 42) --10 mm short axis left infrahilar node (series 15/ image 49) Visualized thyroid is grossly unremarkable. Lungs/Pleura: Mild tree-in-bud nodularity in the medial right lower lobe, medial left lower lobe (series 17/image 33), and left lung base (series 17/ image 94), suggesting mild bronchopneumonia. Mild bronchiectasis in the bilateral lower lobes. No focal consolidation. No pleural effusion or pneumothorax. Upper Abdomen: Visualized upper abdomen is unremarkable. Musculoskeletal: Visualized osseous structures are within normal limits. Review of the MIP images confirms the above  findings. IMPRESSION: No evidence of pulmonary embolism. Tree-in-bud nodularity in the bilateral lower lobes, suggesting mild bronchopneumonia. Mildly prominent thoracic lymph nodes, likely reactive. Electronically Signed   By: Julian Hy M.D.   On: 03/16/2017 13:09    Procedures Procedures (including critical care time)    Medications Ordered in ED Medications  0.9 %  sodium chloride infusion (not administered)  0.9 %  sodium chloride infusion ( Intravenous New Bag/Given 03/16/17 1201)  ceFEPIme (MAXIPIME) 1 g in dextrose 5 % 50 mL IVPB (not administered)  sodium chloride 0.9 % bolus 500 mL (0 mLs Intravenous Stopped 03/16/17 1119)  ipratropium-albuterol (DUONEB) 0.5-2.5 (3) MG/3ML nebulizer solution 3 mL (3 mLs Nebulization Given 03/16/17 0937)  ipratropium-albuterol (DUONEB) 0.5-2.5 (3) MG/3ML nebulizer solution 3 mL (3 mLs Nebulization Given 03/16/17 1121)  methylPREDNISolone sodium succinate (SOLU-MEDROL) 125 mg/2 mL injection 125 mg (125 mg Intravenous Given 03/16/17 1120)  sodium chloride 0.9 % bolus 500 mL (0 mLs Intravenous Stopped 03/16/17 1150)  iopamidol (ISOVUE-370) 76 % injection 100 mL (100 mLs Intravenous Contrast Given 03/16/17 1226)     Initial Impression / Assessment and Plan / ED Course  I have reviewed the triage vital signs and the nursing notes.  Pertinent labs & imaging results that were available during my care of the patient were reviewed by me and considered in my medical decision making (see chart for details).  Patient was findings consistent with pneumonia.  As for community-acquired.  He has an oxygen requirement is not normally on oxygen at home.  Will require admission.  Patient received Zithromax and Rocephin IV.  He will be admitted by the hospitalist.  Is also possible that there is a component of COPD since patient is a long-term smoker.  Influenza test was sent.   Final Clinical Impressions(s) / ED Diagnoses   Final diagnoses:  HCAP  (healthcare-associated pneumonia)  Hypoxia    ED Discharge Orders    None       Fredia Sorrow, MD 03/16/17 1731

## 2017-03-16 NOTE — ED Notes (Signed)
sats 90 % on room air. O2 initiated at 2 L/min via [place]

## 2017-03-16 NOTE — H&P (Signed)
History and Physical    [name] TKW:[id number] DOB: 05-25-1970 DOA: 03/16/2017  Referring MD/NP/PA: Fredia Sorrow, EDP PCP: Sharilyn Sites, MD  Patient coming from: Home  Chief Complaint: Cough, shortness of breath  HPI: [name] is a 46 y.o. male with history of diabetes on insulin pump, tobacco abuse, stage II chronic kidney disease with a baseline creatinine around 1.1 who presents to the hospital today with a one-week history of cough and shortness of breath.  He has had no recent travel or sick contacts, has not been recently treated with antibiotics.  He denies fevers or chills.  Denies chest pain.  In the ED was found to have a new oxygen requirement with a baseline O2 sat of 90% on room air, respiratory rate of 31 and heart rate in the mid upper 1 teens.  Lab work shows a creatinine of 1.34, chest x-ray was negative, CT angiogram of the chest was negative for PE but showed signs of bronchopneumonia in bilateral lower bases and admission has been requested.  Past Medical/Surgical History: Past Medical History:  Diagnosis Date  . Acid reflux   . CKD (chronic kidney disease), stage II   . Deafness    Left ear only. Congenital.  . Diabetes mellitus   . DM2 (diabetes mellitus, type 2) ([place])   . History of influenza 04/18/2011  . Hyperlipidemia   . Hypertension   . Thrombocytopenia ([place])   . Tobacco abuse 04/18/2011  . Vitamin B12 deficiency 04/19/2011    Past Surgical History:  Procedure Laterality Date  . HEMORRHOID SURGERY    . INFUSION PUMP IMPLANTATION     insulin    Social History:  reports that he has been smoking cigarettes.  He has a 40.50 pack-year smoking history. he has never used smokeless tobacco. He reports that he drinks alcohol. He reports that he does not use drugs.  Allergies: Allergies  Allergen Reactions  . Codeine Nausea Only    Drowsiness  . Glucophage [Metformin Hydrochloride] Other (See Comments)    Vomiting and cramps    Family  History:  Family History  Problem Relation Age of Onset  . Hypertension Mother   . Hypertension Father     Prior to Admission medications   Medication Sig Start Date End Date Taking? Authorizing Provider  losartan (COZAAR) 100 MG tablet Take 100 mg daily by mouth. 03/13/17  Yes [provider]  Multiple Vitamins-Minerals (CENTRUM PO) Take 1 tablet by mouth daily.   Yes [provider]  pantoprazole (PROTONIX) 40 MG tablet Take 40 mg daily by mouth. 02/03/17  Yes [provider]  simvastatin (ZOCOR) 20 MG tablet Take 20 mg every morning by mouth.    Yes [provider]    Review of Systems:  Constitutional: Denies fever, chills, diaphoresis, appetite change and fatigue.  HEENT: Denies photophobia, eye pain, redness, hearing loss, ear pain, congestion, sore throat, rhinorrhea, sneezing, mouth sores, trouble swallowing, neck pain, neck stiffness and tinnitus.   Respiratory: Denies  chest tightness,  and wheezing.   Cardiovascular: Denies chest pain, palpitations and leg swelling.  Gastrointestinal: Denies nausea, vomiting, abdominal pain, diarrhea, constipation, blood in stool and abdominal distention.  Genitourinary: Denies dysuria, urgency, frequency, hematuria, flank pain and difficulty urinating.  Endocrine: Denies: hot or cold intolerance, sweats, changes in hair or nails, polyuria, polydipsia. Musculoskeletal: Denies myalgias, back pain, joint swelling, arthralgias and gait problem.  Skin: Denies pallor, rash and wound.  Neurological: Denies dizziness, seizures, syncope, weakness, light-headedness, numbness  and headaches.  Hematological: Denies adenopathy. Easy bruising, personal or family bleeding history  Psychiatric/Behavioral: Denies suicidal ideation, mood changes, confusion, nervousness, sleep disturbance and agitation    Physical Exam: Vitals:   03/16/17 1300 03/16/17 1330 03/16/17 1415 03/16/17 1420  BP: (!) 100/54 107/72    Pulse: (!)  111 (!) 101 100   Resp:      Temp:      TempSrc:      SpO2: 94% 95% 94% 97%  Weight:      Height:         Constitutional: NAD, calm, comfortable Eyes: PERRL, lids and conjunctivae normal ENMT: Mucous membranes are moist. Posterior pharynx clear of any exudate or lesions.Normal dentition.  Neck: normal, supple, no masses, no thyromegaly Respiratory: Coarse breath sounds, positive rhonchi, no wheezing, no accessory muscle use.  Cardiovascular: Regular rate and rhythm, no murmurs / rubs / gallops. No extremity edema. 2+ pedal pulses. No carotid bruits.  Abdomen: no tenderness, no masses palpated. No hepatosplenomegaly. Bowel sounds positive.  Musculoskeletal: no clubbing / cyanosis. No joint deformity upper and lower extremities. Good ROM, no contractures. Normal muscle tone.  Skin: no rashes, lesions, ulcers. No induration Neurologic: CN 2-12 grossly intact. Sensation intact, DTR normal. Strength 5/5 in all 4.  Psychiatric: Normal judgment and insight. Alert and oriented x 3. Normal mood.    Labs on Admission: I have personally reviewed the following labs and imaging studies  CBC: Recent Labs  Lab 03/16/17 0811  WBC 6.7  NEUTROABS 3.7  HGB 17.2*  HCT 51.1  MCV 87.2  PLT 268*   Basic Metabolic Panel: Recent Labs  Lab 03/16/17 0811  NA 139  K 4.0  CL 102  CO2 23  GLUCOSE 189*  BUN 23*  CREATININE 1.34*  CALCIUM 9.2   GFR: Estimated Creatinine Clearance: 71.7 mL/min (A) (by C-G formula based on SCr of 1.34 mg/dL (H)). Liver Function Tests: No results for input(s): AST, ALT, ALKPHOS, BILITOT, PROT, ALBUMIN in the last 168 hours. No results for input(s): LIPASE, AMYLASE in the last 168 hours. No results for input(s): AMMONIA in the last 168 hours. Coagulation Profile: No results for input(s): INR, PROTIME in the last 168 hours. Cardiac Enzymes: No results for input(s): CKTOTAL, CKMB, CKMBINDEX, TROPONINI in the last 168 hours. BNP (last 3 results) No results for  input(s): PROBNP in the last 8760 hours. HbA1C: No results for input(s): HGBA1C in the last 72 hours. CBG: No results for input(s): GLUCAP in the last 168 hours. Lipid Profile: No results for input(s): CHOL, HDL, LDLCALC, TRIG, CHOLHDL, LDLDIRECT in the last 72 hours. Thyroid Function Tests: No results for input(s): TSH, T4TOTAL, FREET4, T3FREE, THYROIDAB in the last 72 hours. Anemia Panel: No results for input(s): VITAMINB12, FOLATE, FERRITIN, TIBC, IRON, RETICCTPCT in the last 72 hours. Urine analysis:    Component Value Date/Time   COLORURINE YELLOW 02/17/2015 [place] 02/17/2015 2340   LABSPEC <1.005 (L) 02/17/2015 2340   PHURINE 6.0 02/17/2015 2340   GLUCOSEU NEGATIVE 02/17/2015 2340   HGBUR NEGATIVE 02/17/2015 2340   BILIRUBINUR NEGATIVE 02/17/2015 2340   KETONESUR NEGATIVE 02/17/2015 2340   PROTEINUR NEGATIVE 02/17/2015 2340   UROBILINOGEN 0.2 02/17/2015 2340   NITRITE NEGATIVE 02/17/2015 2340   LEUKOCYTESUR NEGATIVE 02/17/2015 2340   Sepsis Labs: @LABRCNTIP (procalcitonin:4,lacticidven:4) )No results found for this or any previous visit (from the past 240 hour(s)).   Radiological Exams on Admission: Dg Chest 2 View  Result Date: 03/16/2017 CLINICAL DATA:  Cough, shortness  of breath. EXAM: CHEST  2 VIEW COMPARISON:  Radiographs of April 07, 2016. FINDINGS: The heart size and mediastinal contours are within normal limits. Both lungs are clear. No pneumothorax or pleural effusion is noted. The visualized skeletal structures are unremarkable. IMPRESSION: No active cardiopulmonary disease. Electronically Signed   By: Marijo Conception, M.D.   On: 03/16/2017 08:45   Ct Angio Chest Pe W/cm &/or Wo Cm  Result Date: 03/16/2017 CLINICAL DATA:  Shortness of breath, cough, high pretest probability for PE EXAM: CT ANGIOGRAPHY CHEST WITH CONTRAST TECHNIQUE: Multidetector CT imaging of the chest was performed using the standard protocol during bolus administration of  intravenous contrast. Multiplanar CT image reconstructions and MIPs were obtained to evaluate the vascular anatomy. CONTRAST:  188mL ISOVUE-370 IOPAMIDOL (ISOVUE-370) INJECTION 76% COMPARISON:  Chest radiographs dated 03/16/2017 FINDINGS: Cardiovascular: Initial evaluation of the pulmonary arteries was considered satisfactory due to bolus timing. Repeat evaluation was performed. Satisfactory evaluation of the bilateral pulmonary arteries to the segmental level. No evidence of pulmonary embolism. No evidence of thoracic aortic aneurysm or dissection. The heart is normal in size.  No pericardial effusion. Mediastinum/Nodes: Small thoracic lymph nodes, including: --8 mm short axis high right paratracheal node (series 15/ image 31) --10 mm short axis AP window node (series 15/image 38) --13 mm short axis subcarinal node (series 15/ image 42) --9 mm short axis left hilar node (series 15/ image 42) --10 mm short axis left infrahilar node (series 15/ image 49) Visualized thyroid is grossly unremarkable. Lungs/Pleura: Mild tree-in-bud nodularity in the medial right lower lobe, medial left lower lobe (series 17/image 33), and left lung base (series 17/ image 94), suggesting mild bronchopneumonia. Mild bronchiectasis in the bilateral lower lobes. No focal consolidation. No pleural effusion or pneumothorax. Upper Abdomen: Visualized upper abdomen is unremarkable. Musculoskeletal: Visualized osseous structures are within normal limits. Review of the MIP images confirms the above findings. IMPRESSION: No evidence of pulmonary embolism. Tree-in-bud nodularity in the bilateral lower lobes, suggesting mild bronchopneumonia. Mildly prominent thoracic lymph nodes, likely reactive. Electronically Signed   By: Julian Hy M.D.   On: 03/16/2017 13:09    EKG: Independently reviewed.  Sinus tachycardia at a rate of 119  Assessment/Plan Principal Problem:   CAP (community acquired pneumonia) Active Problems:   Acute on  chronic respiratory failure with hypoxia (HCC)   Acute renal failure (HCC)   CKD (chronic kidney disease), stage II   IDDM (insulin dependent diabetes mellitus) ([place])    Acute hypoxemic respiratory failure -On account of community-acquired pneumonia. -Patient has been a heavy, longtime smoker. -Check blood/sputum cultures. -Check Legionella/strep pneumo urinary antigen. -Influenza PCR has been requested. -Rocephin/azithromycin ordered.    Acute on chronic kidney disease stage II - baseline creatinine is 1.1 and is 1.3 on admission. -Likely due to prerenal azotemia/ATN. -Continue IV fluids, recheck renal function in a.m.  Insulin-dependent diabetes mellitus -Patient is on insulin pump, will continue while in the hospital. -Check A1c.     DVT prophylaxis: Lovenox Code Status: Full code Family Communication: Patient only Disposition Plan: Anticipate discharge home in 24-48 hours Consults called: None Admission status: Observation   Time Spent: 75 minutes  Sohail Capraro Isaac Bliss MD [facility] Pager [phone]  If 7PM-7AM, please contact night-coverage [email or web address] Password [email or web address]  03/16/2017, 3:03 PM

## 2017-03-16 NOTE — ED Triage Notes (Signed)
Pt c/o cough since Friday morning and reports SOB for the past few days.

## 2017-03-16 NOTE — ED Notes (Signed)
Pt transported to Chest CT

## 2017-03-17 DIAGNOSIS — J9621 Acute and chronic respiratory failure with hypoxia: Secondary | ICD-10-CM | POA: Diagnosis not present

## 2017-03-17 DIAGNOSIS — N182 Chronic kidney disease, stage 2 (mild): Secondary | ICD-10-CM | POA: Diagnosis not present

## 2017-03-17 DIAGNOSIS — J189 Pneumonia, unspecified organism: Secondary | ICD-10-CM | POA: Diagnosis not present

## 2017-03-17 LAB — BASIC METABOLIC PANEL
ANION GAP: 10 (ref 5–15)
BUN: 20 mg/dL (ref 6–20)
CALCIUM: 9.1 mg/dL (ref 8.9–10.3)
CHLORIDE: 109 mmol/L (ref 101–111)
CO2: 23 mmol/L (ref 22–32)
CREATININE: 1.09 mg/dL (ref 0.61–1.24)
GFR calc Af Amer: >60 mL/min (ref >60)
Glucose, Bld: 49 mg/dL — ABNORMAL LOW (ref 65–99)
POTASSIUM: 4.2 mmol/L (ref 3.5–5.1)
SODIUM: 142 mmol/L (ref 135–145)

## 2017-03-17 LAB — CBC
HCT: 46.6 % (ref 39.0–52.0)
Hemoglobin: 15.4 g/dL (ref 13.0–17.0)
MCH: 29.6 pg (ref 26.0–34.0)
MCHC: 33 g/dL (ref 30.0–36.0)
MCV: 89.6 fL (ref 78.0–100.0)
PLATELETS: 140 10*3/uL — AB (ref 150–400)
RBC: 5.2 MIL/uL (ref 4.22–5.81)
RDW: 12.3 % (ref 11.5–15.5)
WBC: 8.8 10*3/uL (ref 4.0–10.5)

## 2017-03-17 LAB — HIV ANTIBODY (ROUTINE TESTING W REFLEX): HIV SCREEN 4TH GENERATION: NONREACTIVE

## 2017-03-17 LAB — GLUCOSE, CAPILLARY
GLUCOSE-CAPILLARY: 111 mg/dL — AB (ref 65–99)
GLUCOSE-CAPILLARY: 128 mg/dL — AB (ref 65–99)
GLUCOSE-CAPILLARY: 131 mg/dL — AB (ref 65–99)
GLUCOSE-CAPILLARY: 152 mg/dL — AB (ref 65–99)
GLUCOSE-CAPILLARY: 195 mg/dL — AB (ref 65–99)
GLUCOSE-CAPILLARY: 301 mg/dL — AB (ref 65–99)
GLUCOSE-CAPILLARY: 47 mg/dL — AB (ref 65–99)
GLUCOSE-CAPILLARY: 62 mg/dL — AB (ref 65–99)
Glucose-Capillary: 475 mg/dL — ABNORMAL HIGH (ref 65–99)
Glucose-Capillary: 138 mg/dL — ABNORMAL HIGH (ref 65–99)
Glucose-Capillary: 45 mg/dL — ABNORMAL LOW (ref 65–99)

## 2017-03-17 LAB — BETA-HYDROXYBUTYRIC ACID: Beta-Hydroxybutyric Acid: 0.12 mmol/L (ref 0.05–0.27)

## 2017-03-17 MED ORDER — INSULIN PUMP
Freq: Three times a day (TID) | SUBCUTANEOUS | Status: DC
  Administered 2017-03-17: 3 via SUBCUTANEOUS
  Administered 2017-03-17: 0.6 via SUBCUTANEOUS
  Administered 2017-03-17: 0.7 via SUBCUTANEOUS
  Filled 2017-03-17: qty 1

## 2017-03-17 MED ORDER — DEXTROSE 10 % IV SOLN: INTRAVENOUS | Status: DC

## 2017-03-17 MED ORDER — DEXTROSE 50 % IV SOLN
INTRAVENOUS | Status: AC
  Administered 2017-03-17: 50 mL
  Filled 2017-03-17: qty 50

## 2017-03-17 MED ORDER — GLUCAGON HCL RDNA (DIAGNOSTIC) 1 MG IJ SOLR
INTRAMUSCULAR | Status: AC
  Filled 2017-03-17: qty 1

## 2017-03-17 NOTE — Plan of Care (Signed)
SOB on exertion.  92% on room air.

## 2017-03-17 NOTE — Progress Notes (Signed)
Ambulating in hallway at steady pace at this time.

## 2017-03-17 NOTE — Progress Notes (Signed)
Inpatient Diabetes Program Recommendations  AACE/ADA: New Consensus Statement on Inpatient Glycemic Control (2015)  Target Ranges:  Prepandial:   less than 140 mg/dL      Peak postprandial:   less than 180 mg/dL (1-2 hours)      Critically ill patients:  140 - 180 mg/dL  Results for [name], [name] (MRN [id number]) as of 03/17/2017 10:13  Ref. Range 03/17/2017 00:48 03/17/2017 04:03 03/17/2017 04:27 03/17/2017 04:33 03/17/2017 04:55 03/17/2017 06:27 03/17/2017 07:37  Glucose-Capillary Latest Ref Range: 65 - 99 mg/dL 195 (H) 62 (L) 47 (L) 45 (L) 138 (H) 131 (H) 111 (H)    Review of Glycemic Control  Outpatient Diabetes medications: Metdronic insulin pump with Novolog Current orders for Inpatient glycemic control: Novolog 0-15 units TID with meals  Inpatient Diabetes Program Recommendations:  Insulin Pump: Please order insulin pump order set with CBGs and insulin pump ACHS. Correction (SSI): Please discontinue Novolog 0-15 units TID with meals.  Note: Spoke with patient regarding diabetes and home regimen for diabetes management.  Patient states that he is followed by Dr. Chalmers Cater for diabetes management. Patient uses a Medtronic 670G insulin pump with Novolog insulin as an outpatient. Patient has insulin pump connected and has been using his insulin for glycemic control. Patient states that his glucose went up last night and he bolused himself with insulin and his glucose then went low around 4am today. Patient states that he has continued to use his insulin pump despite being advised to stop it. Patient reports that his glucose rises quickly when his insulin pump is removed and he was concerned about hyperglycemia without his insulin pump. Patient has on continuous glucose monitoring (CGM) sensor as well and his current glucose according to his CGM sensor is 232 mg/dl.   Current insulin pump settings are as follows:  Basal insulin  12A-4A  1.3 units/hour 4A-8A  1.75 units/hour 8A-1P  0.55  units/hour 1P-6:30P 0.65units/hour 6:30P-12A 0.55 units/hour Total daily basal insulin: 21.55 units/24 hours  Carb Coverage 1:9 1 unit for every 9 grams of carbohydrates  Insulin Sensitivity 1:35 1 unit drops blood glucose 35 mg/dl  Target Glucose Goals 12A 100-150 mg/dl   Active insulin time: 2 hours 30 mins  In talking with the patient he states that his blood glucose normally runs very good and he has been in his current insulin pump for about 4 weeks. Discussed Solumedrol impact on glucose (was given Solumedrol 125 mg @ 11:20 am on 03/16/17) and explained that Solumedrol may keep glucose elevated over the next couple of days. Patient reports that he is keeping a close eye on his glucose and giving himself correction and meal coverage when needed. Informed patient, a request to reorder insulin pump order set would be requested.  Patient verbalized understanding of information discussed and states that he does not have any further questions related to diabetes at this time.  NURSING:  The patient insulin pump flow sheet will be completed by the patient at the bedside and the RN caring for the patient will use the patient's flow sheet to document in the [facility]. RN will need to complete the Nursing Insulin Pump Flowsheet at least once a shift. Patient will need to keep extra insulin pump supplies at the bedside at all times.   Thanks, Barnie Alderman, RN, MSN, CDE Diabetes Coordinator Inpatient Diabetes Program [phone] (Team Pager from 8am to 5pm)

## 2017-03-17 NOTE — Progress Notes (Signed)
Sitting in bed.  SOB on exertion.  Wheezing with nonproductive cough and 92% on room air.

## 2017-03-17 NOTE — Progress Notes (Signed)
Hypoglycemic Event  CBG: 62  Treatment:  8 oz juice with sugar, then 59ml IV Dextrose  Symptoms:  none  Follow-up CBG: Time. 15 min later CBG Result: 138  Possible Reasons for Event:  Pt self regulates insulin by insulin pump, MD ordered pt to cuff off pump at 2200. Pt gave himself  7.9 units of insulin at 2130. No more insulin was gave after that.  Comments/MD notified: D. Juliane Poot l Hassell Done

## 2017-03-17 NOTE — Progress Notes (Signed)
PROGRESS NOTE    [name]  OJJ:[id number] DOB: 10-Nov-1970 DOA: 03/16/2017 PCP: Sharilyn Sites, MD     Brief Narrative:  46 year old man admitted on 11/19 with cough and shortness of breath found to have a community-acquired pneumonia and admission requested.   Assessment & Plan:   Principal Problem:   CAP (community acquired pneumonia) Active Problems:   Acute on chronic respiratory failure with hypoxia (HCC)   Acute renal failure (HCC)   CKD (chronic kidney disease), stage II   IDDM (insulin dependent diabetes mellitus) ([place])   Community-acquired pneumonia -Patient is symptomatically improved. -Has been fully weaned off of oxygen. -Has been afebrile since admission. -Culture data remains negative at 24 hours.,  Strep pneumo urine antigen is negative. -Continue Rocephin and azithromycin. -We will keep 1 more night given patient's perceived shortness of breath and rhonchorous breath sounds.  Insulin-dependent diabetes -On insulin pump. -Had some hypoglycemia overnight likely due to overcorrection from hyperglycemia earlier in the day. -Insulin pump was transiently discontinued but has been placed back on it as of this morning. -Most recent CBG is 152.  Acute on chronic kidney disease stage II -Baseline creatinine is around 1-1.9 with a GFR greater than 60. -Creatinine was 1.34 on admission, down to baseline of 1.09 today. -Likely due to prerenal azotemia/ATN.   DVT prophylaxis: lovenox Code Status: full code Family Communication: patient only Disposition Plan: likely home in 24-48 hours  Consultants:   none  Procedures:   none  Antimicrobials:  Anti-infectives (From admission, onward)   Start     Dose/Rate Route Frequency Ordered Stop   03/17/17 1400  cefTRIAXone (ROCEPHIN) 1 g in dextrose 5 % 50 mL IVPB     1 g 100 mL/hr over 30 Minutes Intravenous Every 24 hours 03/16/17 1535 03/24/17 1359   03/17/17 1000  azithromycin (ZITHROMAX) tablet 500 mg     500 mg Oral Every 24 hours 03/16/17 1535 03/24/17 0959   03/16/17 1415  cefTRIAXone (ROCEPHIN) 1 g in dextrose 5 % 50 mL IVPB     1 g 100 mL/hr over 30 Minutes Intravenous  Once 03/16/17 1400 03/16/17 1456   03/16/17 1415  azithromycin (ZITHROMAX) 500 mg in dextrose 5 % 250 mL IVPB  Status:  Discontinued     500 mg 250 mL/hr over 60 Minutes Intravenous  Once 03/16/17 1400 03/16/17 1615   03/16/17 1400  ceFEPIme (MAXIPIME) 1 g in dextrose 5 % 50 mL IVPB  Status:  Discontinued     1 g 100 mL/hr over 30 Minutes Intravenous  Once 03/16/17 1348 03/16/17 1359       Subjective: Feels well, improved, altho still with SOB and does not feel at his baseline  Objective: Vitals:   03/16/17 2105 03/17/17 0617 03/17/17 0800 03/17/17 1300  BP: (!) 105/50 (!) 99/58 124/76 110/71  Pulse: (!) 108 (!) 101 86 75  Resp: 20 20 20 20   Temp: 98.4 F (36.9 C) 98.2 F (36.8 C) 98.3 F (36.8 C) 97.6 F (36.4 C)  TempSrc: Oral Oral Oral Oral  SpO2: 90% 91% 92% 97%  Weight:      Height:        Intake/Output Summary (Last 24 hours) at 03/17/2017 1759 Last data filed at 03/17/2017 1748 Gross per 24 hour  Intake 1098.75 ml  Output -  Net 1098.75 ml   Filed Weights   03/16/17 0758 03/16/17 1635  Weight: 85.3 kg (188 lb) 80.8 kg (178 lb 1.6 oz)    Examination:  General  exam: Alert, awake, oriented x 3 Respiratory system: ronchi and end expiratory wheezes Cardiovascular system:RRR. No murmurs, rubs, gallops. Gastrointestinal system: Abdomen is nondistended, soft and nontender. No organomegaly or masses felt. Normal bowel sounds heard. Central nervous system: Alert and oriented. No focal neurological deficits. Extremities: No C/C/E, +pedal pulses Skin: No rashes, lesions or ulcers Psychiatry: Judgement and insight appear normal. Mood & affect appropriate.     Data Reviewed: I have personally reviewed following labs and imaging studies  CBC: Recent Labs  Lab 03/16/17 0811 03/17/17 0435    WBC 6.7 8.8  NEUTROABS 3.7  --   HGB 17.2* 15.4  HCT 51.1 46.6  MCV 87.2 89.6  PLT 130* 767*   Basic Metabolic Panel: Recent Labs  Lab 03/16/17 0811 03/16/17 1947 03/17/17 0435  NA 139 132* 142  K 4.0 4.1 4.2  CL 102 102 109  CO2 23 21* 23  GLUCOSE 189* 446* 49*  BUN 23* 22* 20  CREATININE 1.34* 1.29* 1.09  CALCIUM 9.2 8.5* 9.1   GFR: Estimated Creatinine Clearance: 81.1 mL/min (by C-G formula based on SCr of 1.09 mg/dL). Liver Function Tests: No results for input(s): AST, ALT, ALKPHOS, BILITOT, PROT, ALBUMIN in the last 168 hours. No results for input(s): LIPASE, AMYLASE in the last 168 hours. No results for input(s): AMMONIA in the last 168 hours. Coagulation Profile: No results for input(s): INR, PROTIME in the last 168 hours. Cardiac Enzymes: No results for input(s): CKTOTAL, CKMB, CKMBINDEX, TROPONINI in the last 168 hours. BNP (last 3 results) No results for input(s): PROBNP in the last 8760 hours. HbA1C: No results for input(s): HGBA1C in the last 72 hours. CBG: Recent Labs  Lab 03/17/17 0455 03/17/17 0627 03/17/17 0737 03/17/17 1115 03/17/17 1655  GLUCAP 138* 131* 111* 301* 152*   Lipid Profile: No results for input(s): CHOL, HDL, LDLCALC, TRIG, CHOLHDL, LDLDIRECT in the last 72 hours. Thyroid Function Tests: No results for input(s): TSH, T4TOTAL, FREET4, T3FREE, THYROIDAB in the last 72 hours. Anemia Panel: No results for input(s): VITAMINB12, FOLATE, FERRITIN, TIBC, IRON, RETICCTPCT in the last 72 hours. Urine analysis:    Component Value Date/Time   COLORURINE YELLOW 02/17/2015 2340   APPEARANCEUR CLEAR 02/17/2015 2340   LABSPEC <1.005 (L) 02/17/2015 2340   PHURINE 6.0 02/17/2015 2340   GLUCOSEU NEGATIVE 02/17/2015 2340   HGBUR NEGATIVE 02/17/2015 2340   BILIRUBINUR NEGATIVE 02/17/2015 2340   KETONESUR NEGATIVE 02/17/2015 2340   PROTEINUR NEGATIVE 02/17/2015 2340   UROBILINOGEN 0.2 02/17/2015 2340   NITRITE NEGATIVE 02/17/2015 2340    LEUKOCYTESUR NEGATIVE 02/17/2015 2340   Sepsis Labs: @LABRCNTIP (procalcitonin:4,lacticidven:4)  ) Recent Results (from the past 240 hour(s))  Culture, blood (routine x 2) Call MD if unable to obtain prior to antibiotics being given     Status: None (Preliminary result)   Collection Time: 03/16/17  4:01 PM  Result Value Ref Range Status   Specimen Description LEFT ANTECUBITAL  Final   Special Requests   Final    BOTTLES DRAWN AEROBIC AND ANAEROBIC Blood Culture adequate volume   Culture NO GROWTH < 24 HOURS  Final   Report Status PENDING  Incomplete  Culture, blood (routine x 2) Call MD if unable to obtain prior to antibiotics being given     Status: None (Preliminary result)   Collection Time: 03/16/17  4:04 PM  Result Value Ref Range Status   Specimen Description BLOOD RIGHT HAND  Final   Special Requests   Final    BOTTLES DRAWN AEROBIC AND  ANAEROBIC Blood Culture adequate volume   Culture NO GROWTH < 24 HOURS  Final   Report Status PENDING  Incomplete         Radiology Studies: Dg Chest 2 View  Result Date: 03/16/2017 CLINICAL DATA:  Cough, shortness of breath. EXAM: CHEST  2 VIEW COMPARISON:  Radiographs of April 07, 2016. FINDINGS: The heart size and mediastinal contours are within normal limits. Both lungs are clear. No pneumothorax or pleural effusion is noted. The visualized skeletal structures are unremarkable. IMPRESSION: No active cardiopulmonary disease. Electronically Signed   By: Marijo Conception, M.D.   On: 03/16/2017 08:45   Ct Angio Chest Pe W/cm &/or Wo Cm  Result Date: 03/16/2017 CLINICAL DATA:  Shortness of breath, cough, high pretest probability for PE EXAM: CT ANGIOGRAPHY CHEST WITH CONTRAST TECHNIQUE: Multidetector CT imaging of the chest was performed using the standard protocol during bolus administration of intravenous contrast. Multiplanar CT image reconstructions and MIPs were obtained to evaluate the vascular anatomy. CONTRAST:  1106mL ISOVUE-370  IOPAMIDOL (ISOVUE-370) INJECTION 76% COMPARISON:  Chest radiographs dated 03/16/2017 FINDINGS: Cardiovascular: Initial evaluation of the pulmonary arteries was considered satisfactory due to bolus timing. Repeat evaluation was performed. Satisfactory evaluation of the bilateral pulmonary arteries to the segmental level. No evidence of pulmonary embolism. No evidence of thoracic aortic aneurysm or dissection. The heart is normal in size.  No pericardial effusion. Mediastinum/Nodes: Small thoracic lymph nodes, including: --8 mm short axis high right paratracheal node (series 15/ image 31) --10 mm short axis AP window node (series 15/image 38) --13 mm short axis subcarinal node (series 15/ image 42) --9 mm short axis left hilar node (series 15/ image 42) --10 mm short axis left infrahilar node (series 15/ image 49) Visualized thyroid is grossly unremarkable. Lungs/Pleura: Mild tree-in-bud nodularity in the medial right lower lobe, medial left lower lobe (series 17/image 33), and left lung base (series 17/ image 94), suggesting mild bronchopneumonia. Mild bronchiectasis in the bilateral lower lobes. No focal consolidation. No pleural effusion or pneumothorax. Upper Abdomen: Visualized upper abdomen is unremarkable. Musculoskeletal: Visualized osseous structures are within normal limits. Review of the MIP images confirms the above findings. IMPRESSION: No evidence of pulmonary embolism. Tree-in-bud nodularity in the bilateral lower lobes, suggesting mild bronchopneumonia. Mildly prominent thoracic lymph nodes, likely reactive. Electronically Signed   By: Julian Hy M.D.   On: 03/16/2017 13:09        Scheduled Meds: . azithromycin  500 mg Oral Q24H  . enoxaparin (LOVENOX) injection  40 mg Subcutaneous Q24H  . insulin pump   Subcutaneous TID AC, HS, 0200  . losartan  100 mg Oral Daily   Continuous Infusions: . sodium chloride 75 mL/hr at 03/17/17 0701  . cefTRIAXone (ROCEPHIN)  IV Stopped (03/17/17  1349)  . dextrose       LOS: 0 days    Time spent: 25 minutes. Greater than 50% of this time was spent in direct contact with the patient coordinating care.     Lelon Frohlich, MD [facility] Pager [phone]  If 7PM-7AM, please contact night-coverage [email or web address] Password [email or web address] 03/17/2017, 5:59 PM

## 2017-03-18 DIAGNOSIS — J189 Pneumonia, unspecified organism: Secondary | ICD-10-CM | POA: Diagnosis not present

## 2017-03-18 LAB — LEGIONELLA PNEUMOPHILA SEROGP 1 UR AG: L. PNEUMOPHILA SEROGP 1 UR AG: NEGATIVE

## 2017-03-18 LAB — GLUCOSE, CAPILLARY
GLUCOSE-CAPILLARY: 95 mg/dL (ref 65–99)
GLUCOSE-CAPILLARY: 56 mg/dL — AB (ref 65–99)
Glucose-Capillary: 129 mg/dL — ABNORMAL HIGH (ref 65–99)

## 2017-03-18 MED ORDER — AMOXICILLIN-POT CLAVULANATE 875-125 MG PO TABS: 1.0000 | ORAL_TABLET | Freq: Two times a day (BID) | ORAL | 0 refills | Status: AC

## 2017-03-18 NOTE — Progress Notes (Signed)
[name] discharged Home per MD order.  Discharge instructions reviewed and discussed with the patient, all questions and concerns answered. Copy of instructions and scripts given to patient.  Allergies as of 03/18/2017      Reactions   Codeine Nausea Only   Drowsiness   Glucophage [metformin Hydrochloride] Other (See Comments)   Vomiting and cramps      Medication List    TAKE these medications   amoxicillin-clavulanate 875-125 MG tablet Commonly known as:  AUGMENTIN Take 1 tablet by mouth 2 (two) times daily for 6 days.   CENTRUM PO Take 1 tablet by mouth daily.   losartan 100 MG tablet Commonly known as:  COZAAR Take 100 mg daily by mouth.   pantoprazole 40 MG tablet Commonly known as:  PROTONIX Take 40 mg daily by mouth.   simvastatin 20 MG tablet Commonly known as:  ZOCOR Take 20 mg every morning by mouth.       Patients skin is clean, dry and intact, no evidence of skin break down. IV site discontinued and catheter remains intact. Site without signs and symptoms of complications. Dressing and pressure applied.  Patient escorted to the elevator,  no distress noted upon discharge.  [name] 03/18/2017 12:45 PM

## 2017-03-18 NOTE — Discharge Summary (Signed)
Physician Discharge Summary  [name] UJW:[id number] DOB: 05-May-1970 DOA: 03/16/2017  PCP: Sharilyn Sites, MD  Admit date: 03/16/2017 Discharge date: 03/18/2017  Time spent: 45 minutes  Recommendations for Outpatient Follow-up:  -Will be discharged home today. -Advised to follow up with PCP in 2 weeks. -To complete course of augmentin for CAP.   Discharge Diagnoses:  Principal Problem:   CAP (community acquired pneumonia) Active Problems:   Acute on chronic respiratory failure with hypoxia (HCC)   Acute renal failure (HCC)   CKD (chronic kidney disease), stage II   IDDM (insulin dependent diabetes mellitus) ([place])   Discharge Condition: Stable and improved  Filed Weights   03/16/17 0758 03/16/17 1635  Weight: 85.3 kg (188 lb) 80.8 kg (178 lb 1.6 oz)    History of present illness:  [name] is a 46 y.o. male with history of diabetes on insulin pump, tobacco abuse, stage II chronic kidney disease with a baseline creatinine around 1.1 who presents to the hospital today with a one-week history of cough and shortness of breath.  He has had no recent travel or sick contacts, has not been recently treated with antibiotics.  He denies fevers or chills.  Denies chest pain.  In the ED was found to have a new oxygen requirement with a baseline O2 sat of 90% on room air, respiratory rate of 31 and heart rate in the mid upper 1 teens.  Lab work shows a creatinine of 1.34, chest x-ray was negative, CT angiogram of the chest was negative for PE but showed signs of bronchopneumonia in bilateral lower bases and admission has been requested.    Hospital Course:   Community-acquired pneumonia -Patient is symptomatically improved. -Has been fully weaned off of oxygen. -Has been afebrile since admission. -Culture data remains negative at 2 days.,  Strep pneumo urine antigen is negative. -DC on 6 additional days of augmentin to complete an 8 day course of  antibiotics..  Insulin-dependent diabetes -On insulin pump. -Has had some hypoglycemic episodes which he attributes to eating less here than he does at home.   Acute on chronic kidney disease stage II -Baseline creatinine is around 1-1.9 with a GFR greater than 60. -Creatinine was 1.34 on admission, down to baseline of 1.09 on DC. -Likely due to prerenal azotemia/ATN.    Procedures:  None   Consultations:  None  Discharge Instructions  Discharge Instructions    Diet - low sodium heart healthy   Complete by:  As directed    Increase activity slowly   Complete by:  As directed      Allergies as of 03/18/2017      Reactions   Codeine Nausea Only   Drowsiness   Glucophage [metformin Hydrochloride] Other (See Comments)   Vomiting and cramps      Medication List    TAKE these medications   amoxicillin-clavulanate 875-125 MG tablet Commonly known as:  AUGMENTIN Take 1 tablet by mouth 2 (two) times daily for 6 days.   CENTRUM PO Take 1 tablet by mouth daily.   losartan 100 MG tablet Commonly known as:  COZAAR Take 100 mg daily by mouth.   pantoprazole 40 MG tablet Commonly known as:  PROTONIX Take 40 mg daily by mouth.   simvastatin 20 MG tablet Commonly known as:  ZOCOR Take 20 mg every morning by mouth.      Allergies  Allergen Reactions  . Codeine Nausea Only    Drowsiness  . Glucophage [Metformin Hydrochloride]  Other (See Comments)    Vomiting and cramps   Follow-up Information    Sharilyn Sites, MD Follow up on 04/23/2017.   Specialty:  Family Medicine Why:  at 1:00 pm Contact information: [address] [place] [address] [phone]            The results of significant diagnostics from this hospitalization (including imaging, microbiology, ancillary and laboratory) are listed below for reference.    Significant Diagnostic Studies: Dg Chest 2 View  Result Date: 03/16/2017 CLINICAL DATA:  Cough, shortness of breath.  EXAM: CHEST  2 VIEW COMPARISON:  Radiographs of April 07, 2016. FINDINGS: The heart size and mediastinal contours are within normal limits. Both lungs are clear. No pneumothorax or pleural effusion is noted. The visualized skeletal structures are unremarkable. IMPRESSION: No active cardiopulmonary disease. Electronically Signed   By: Marijo Conception, M.D.   On: 03/16/2017 08:45   Ct Angio Chest Pe W/cm &/or Wo Cm  Result Date: 03/16/2017 CLINICAL DATA:  Shortness of breath, cough, high pretest probability for PE EXAM: CT ANGIOGRAPHY CHEST WITH CONTRAST TECHNIQUE: Multidetector CT imaging of the chest was performed using the standard protocol during bolus administration of intravenous contrast. Multiplanar CT image reconstructions and MIPs were obtained to evaluate the vascular anatomy. CONTRAST:  160mL ISOVUE-370 IOPAMIDOL (ISOVUE-370) INJECTION 76% COMPARISON:  Chest radiographs dated 03/16/2017 FINDINGS: Cardiovascular: Initial evaluation of the pulmonary arteries was considered satisfactory due to bolus timing. Repeat evaluation was performed. Satisfactory evaluation of the bilateral pulmonary arteries to the segmental level. No evidence of pulmonary embolism. No evidence of thoracic aortic aneurysm or dissection. The heart is normal in size.  No pericardial effusion. Mediastinum/Nodes: Small thoracic lymph nodes, including: --8 mm short axis high right paratracheal node (series 15/ image 31) --10 mm short axis AP window node (series 15/image 38) --13 mm short axis subcarinal node (series 15/ image 42) --9 mm short axis left hilar node (series 15/ image 42) --10 mm short axis left infrahilar node (series 15/ image 49) Visualized thyroid is grossly unremarkable. Lungs/Pleura: Mild tree-in-bud nodularity in the medial right lower lobe, medial left lower lobe (series 17/image 33), and left lung base (series 17/ image 94), suggesting mild bronchopneumonia. Mild bronchiectasis in the bilateral lower lobes. No  focal consolidation. No pleural effusion or pneumothorax. Upper Abdomen: Visualized upper abdomen is unremarkable. Musculoskeletal: Visualized osseous structures are within normal limits. Review of the MIP images confirms the above findings. IMPRESSION: No evidence of pulmonary embolism. Tree-in-bud nodularity in the bilateral lower lobes, suggesting mild bronchopneumonia. Mildly prominent thoracic lymph nodes, likely reactive. Electronically Signed   By: Julian Hy M.D.   On: 03/16/2017 13:09    Microbiology: Recent Results (from the past 240 hour(s))  Culture, blood (routine x 2) Call MD if unable to obtain prior to antibiotics being given     Status: None (Preliminary result)   Collection Time: 03/16/17  4:01 PM  Result Value Ref Range Status   Specimen Description LEFT ANTECUBITAL  Final   Special Requests   Final    BOTTLES DRAWN AEROBIC AND ANAEROBIC Blood Culture adequate volume   Culture NO GROWTH 2 DAYS  Final   Report Status PENDING  Incomplete  Culture, blood (routine x 2) Call MD if unable to obtain prior to antibiotics being given     Status: None (Preliminary result)   Collection Time: 03/16/17  4:04 PM  Result Value Ref Range Status   Specimen Description BLOOD RIGHT HAND  Final   Special  Requests   Final    BOTTLES DRAWN AEROBIC AND ANAEROBIC Blood Culture adequate volume   Culture NO GROWTH 2 DAYS  Final   Report Status PENDING  Incomplete     Labs: Basic Metabolic Panel: Recent Labs  Lab 03/16/17 0811 03/16/17 1947 03/17/17 0435  NA 139 132* 142  K 4.0 4.1 4.2  CL 102 102 109  CO2 23 21* 23  GLUCOSE 189* 446* 49*  BUN 23* 22* 20  CREATININE 1.34* 1.29* 1.09  CALCIUM 9.2 8.5* 9.1   Liver Function Tests: No results for input(s): AST, ALT, ALKPHOS, BILITOT, PROT, ALBUMIN in the last 168 hours. No results for input(s): LIPASE, AMYLASE in the last 168 hours. No results for input(s): AMMONIA in the last 168 hours. CBC: Recent Labs  Lab 03/16/17 0811  03/17/17 0435  WBC 6.7 8.8  NEUTROABS 3.7  --   HGB 17.2* 15.4  HCT 51.1 46.6  MCV 87.2 89.6  PLT 130* 140*   Cardiac Enzymes: No results for input(s): CKTOTAL, CKMB, CKMBINDEX, TROPONINI in the last 168 hours. BNP: BNP (last 3 results) No results for input(s): BNP in the last 8760 hours.  ProBNP (last 3 results) No results for input(s): PROBNP in the last 8760 hours.  CBG: Recent Labs  Lab 03/17/17 1655 03/17/17 2211 03/18/17 0146 03/18/17 0734 03/18/17 0820  GLUCAP 152* 128* 95 56* 129*       Signed:  Lelon Frohlich  [facility] Pager: [phone] 03/18/2017, 5:51 PM

## 2017-03-21 LAB — CULTURE, BLOOD (ROUTINE X 2)
CULTURE: NO GROWTH
Culture: NO GROWTH
Special Requests: ADEQUATE
Special Requests: ADEQUATE

## 2017-09-04 ENCOUNTER — Ambulatory Visit: Payer: 59 | Admitting: Cardiology

## 2017-09-04 ENCOUNTER — Encounter: Payer: Self-pay | Admitting: Cardiology

## 2017-09-04 VITALS — BP 110/68 | HR 85 | Ht 67.0 in | Wt 171.0 lb

## 2017-09-04 DIAGNOSIS — Z720 Tobacco use: Secondary | ICD-10-CM

## 2017-09-04 DIAGNOSIS — R0609 Other forms of dyspnea: Secondary | ICD-10-CM | POA: Diagnosis not present

## 2017-09-04 DIAGNOSIS — N182 Chronic kidney disease, stage 2 (mild): Secondary | ICD-10-CM

## 2017-09-04 DIAGNOSIS — E1165 Type 2 diabetes mellitus with hyperglycemia: Secondary | ICD-10-CM

## 2017-09-04 DIAGNOSIS — Z8249 Family history of ischemic heart disease and other diseases of the circulatory system: Secondary | ICD-10-CM

## 2017-09-04 NOTE — Patient Instructions (Signed)
Your physician recommends that you schedule a follow-up appointment in:  To be determined after tests   Your physician has requested that you have en exercise stress myoview. For further information please visit [email or web address]. Please follow instruction sheet, as given.    Your physician recommends that you continue on your current medications as directed. Please refer to the Current Medication list given to you today.    If you need a refill on your cardiac medications before your next appointment, please call your pharmacy.   No lab work ordered today.     Thank you for choosing [facility] !

## 2017-09-04 NOTE — Progress Notes (Signed)
Cardiology Office Note  Date: 09/04/2017   ID: [name], DOB 07-Oct-1970, MRN [id number]  PCP: Sharilyn Sites, MD  Consulting Cardiologist: Rozann Lesches, MD   Chief Complaint  Patient presents with  . Cardiac evaluation  . Dyspnea on exertion    History of Present Illness: [name] is a 47 y.o. male referred for cardiology consultation by Dr. Hilma Favors for cardiac evaluation.  He reports a 10-year history of type 2 diabetes mellitus, has an insulin pump in place.  He works for an [employer] at this time.  States that when he walks for a long distance or goes up an incline or ladder carrying a weight he has NYHA class III dyspnea.  This is been present over the last year.  No definite chest tightness with exertion, no palpitations or syncope.  Additional cardiac risk factors include heart disease in his father, tobacco abuse, hypertension, and hyperlipidemia.  He states that he underwent a stress test perhaps 10 years ago for screening purposes but has had no follow-up testing.  I personally reviewed his ECG today which shows normal sinus rhythm.  I reviewed his medications which include Cozaar, Zocor, and insulin via pump.  No records sent regarding recent lab work.  Past Medical History:  Diagnosis Date  . CKD (chronic kidney disease), stage II   . Deafness    Left ear only. Congenital.  . GERD (gastroesophageal reflux disease)   . History of influenza   . Hyperlipidemia   . Hypertension   . Thrombocytopenia ([place])   . Type 2 diabetes mellitus ([place])   . Vitamin B12 deficiency     Past Surgical History:  Procedure Laterality Date  . HEMORRHOID SURGERY    . INFUSION PUMP IMPLANTATION     Insulin  . NASAL SEPTUM SURGERY      Current Outpatient Medications  Medication Sig Dispense Refill  . losartan (COZAAR) 100 MG tablet Take 100 mg daily by mouth.  1  . Multiple Vitamins-Minerals (CENTRUM PO) Take 1 tablet by mouth daily.    Marland Kitchen NOVOLOG 100 UNIT/ML  injection Inject into the skin.     . pantoprazole (PROTONIX) 40 MG tablet Take 40 mg daily by mouth.  0  . simvastatin (ZOCOR) 20 MG tablet Take 20 mg every morning by mouth.      No current facility-administered medications for this visit.    Allergies:  Codeine and Glucophage [metformin hydrochloride]   Social History: The patient  reports that he has been smoking cigarettes.  He has a 40.50 pack-year smoking history. He has never used smokeless tobacco. He reports that he drinks alcohol. He reports that he does not use drugs.   Family History: The patient's family history includes Heart disease in his father; Hypertension in his father and mother.   ROS:  Please see the history of present illness. Otherwise, complete review of systems is positive for occasional leg cramps.  All other systems are reviewed and negative.   Physical Exam: VS:  BP 110/68 (BP Location: Left Arm)   Pulse 85   Ht 5\' 7"  (1.702 m)   Wt 171 lb (77.6 kg)   SpO2 97%   BMI 26.78 kg/m , BMI Body mass index is 26.78 kg/m.  Wt Readings from Last 3 Encounters:  09/04/17 171 lb (77.6 kg)  03/16/17 178 lb 1.6 oz (80.8 kg)  12/17/15 180 lb (81.6 kg)    General: Patient appears comfortable at rest. HEENT: Conjunctiva and lids normal, oropharynx  clear. Neck: Supple, no elevated JVP or carotid bruits, no thyromegaly. Lungs: Clear to auscultation, nonlabored breathing at rest. Cardiac: Regular rate and rhythm, S4, no significant systolic murmur, no pericardial rub. Abdomen: Soft, nontender, bowel sounds present, no guarding or rebound. Extremities: No pitting edema, distal pulses 2+. Skin: Warm and dry. Musculoskeletal: No kyphosis. Neuropsychiatric: Alert and oriented x3, affect grossly appropriate.  ECG: I personally reviewed the tracing from 03/16/2017 which showed sinus tachycardia with possible biatrial enlargement, high lateral Q waves.  Recent Labwork: 03/17/2017: BUN 20; Creatinine, Ser 1.09; Hemoglobin  15.4; Platelets 140; Potassium 4.2; Sodium 142   Other Studies Reviewed Today:  Chest CTA 03/16/2017: IMPRESSION: No evidence of pulmonary embolism.  Tree-in-bud nodularity in the bilateral lower lobes, suggesting mild bronchopneumonia.  Mildly prominent thoracic lymph nodes, likely reactive.  Assessment and Plan:  1.  Dyspnea on exertion and a 47 year old male with long-standing type 2 diabetes mellitus currently on insulin pump, ongoing tobacco abuse, hypertension, hyperlipidemia, and family history of CAD in his father.  Baseline rest ECG is reassuring.  He has not undergone any objective ischemic testing in at least 10 years.  We will obtain an exercise Myoview for further evaluation.   2.  Tobacco abuse, smoking cessation recommended.  3.  Type 2 diabetes mellitus on insulin pump.  He follows with Dr. Hilma Favors.  4.  CKD stage II based on records.  Creatinine 1.09 as of November 2018.  Current medicines were reviewed with the patient today.   Orders Placed This Encounter  Procedures  . NM Myocar Multi W/Spect W/Wall Motion / EF  . EKG 12-Lead    Disposition: Call with test results.  Signed, Satira Sark, MD, [facility] 09/04/2017 9:17 AM    [place] [facility] [place]. [address] [place], [place] [address] Phone: [phone]; Fax: [phone]

## 2017-09-18 ENCOUNTER — Encounter (HOSPITAL_COMMUNITY)
Admission: RE | Admit: 2017-09-18 | Discharge: 2017-09-18 | Disposition: A | Discharge status: Home / Self Care | Payer: 59 | Source: Ambulatory Visit | Attending: Cardiology | Admitting: Cardiology

## 2017-09-18 ENCOUNTER — Ambulatory Visit (HOSPITAL_COMMUNITY)
Admission: RE | Admit: 2017-09-18 | Discharge: 2017-09-18 | Disposition: A | Discharge status: Home / Self Care | Payer: 59 | Source: Ambulatory Visit | Attending: Cardiology | Admitting: Cardiology

## 2017-09-18 ENCOUNTER — Encounter (HOSPITAL_COMMUNITY): Payer: Self-pay

## 2017-09-18 DIAGNOSIS — R0609 Other forms of dyspnea: Secondary | ICD-10-CM | POA: Diagnosis not present

## 2017-09-18 LAB — NM MYOCAR MULTI W/SPECT W/WALL MOTION / EF
CHL CUP MPHR: 171 {beats}/min
CHL CUP NUCLEAR SSS: 0
CHL CUP RESTING HR STRESS: 60 {beats}/min
CHL RATE OF PERCEIVED EXERTION: 15
CSEPEDS: 25 s
CSEPHR: 98 %
Estimated workload: 11.3 METS
Exercise duration (min): 9 min
LV sys vol: 20 mL
LVDIAVOL: 68 mL (ref 62–150)
Peak HR: 169 {beats}/min
RATE: 0.29
SDS: 0
SRS: 0
TID: 1.27

## 2017-09-18 MED ORDER — TECHNETIUM TC 99M TETROFOSMIN IV KIT
10.0000 | PACK | Freq: Once | INTRAVENOUS | Status: AC | PRN
  Administered 2017-09-18: 10.5 via INTRAVENOUS

## 2017-09-18 MED ORDER — TECHNETIUM TC 99M TETROFOSMIN IV KIT
30.0000 | PACK | Freq: Once | INTRAVENOUS | Status: AC | PRN
  Administered 2017-09-18: 31 via INTRAVENOUS

## 2017-09-18 MED ORDER — SODIUM CHLORIDE 0.9% FLUSH
INTRAVENOUS | Status: AC
  Administered 2017-09-18: 10 mL via INTRAVENOUS
  Filled 2017-09-18: qty 10

## 2017-09-18 MED ORDER — REGADENOSON 0.4 MG/5ML IV SOLN
INTRAVENOUS | Status: AC
  Filled 2017-09-18: qty 5

## 2017-09-18 MED ORDER — SODIUM CHLORIDE 0.9% FLUSH
INTRAVENOUS | Status: AC
  Filled 2017-09-18: qty 100

## 2017-11-10 ENCOUNTER — Ambulatory Visit: Payer: Self-pay | Admitting: Cardiology

## 2017-12-25 ENCOUNTER — Encounter: Payer: Self-pay | Admitting: Cardiology

## 2017-12-25 ENCOUNTER — Ambulatory Visit: Payer: 59 | Admitting: Cardiology

## 2017-12-25 VITALS — BP 98/60 | HR 90 | Ht 63.0 in | Wt 178.0 lb

## 2017-12-25 DIAGNOSIS — N182 Chronic kidney disease, stage 2 (mild): Secondary | ICD-10-CM

## 2017-12-25 DIAGNOSIS — Z720 Tobacco use: Secondary | ICD-10-CM | POA: Diagnosis not present

## 2017-12-25 DIAGNOSIS — E1165 Type 2 diabetes mellitus with hyperglycemia: Secondary | ICD-10-CM

## 2017-12-25 DIAGNOSIS — R0609 Other forms of dyspnea: Secondary | ICD-10-CM | POA: Diagnosis not present

## 2017-12-25 NOTE — Patient Instructions (Signed)
Your physician wants you to follow-up in: 1 year with Dr.McDowell You will receive a reminder letter in the mail two months in advance. If you don't receive a letter, please call our office to schedule the follow-up appointment.     Your physician recommends that you continue on your current medications as directed. Please refer to the Current Medication list given to you today.    If you need a refill on your cardiac medications before your next appointment, please call your pharmacy.      No labs or tests today       Thank you for choosing [facility] !        

## 2017-12-25 NOTE — Progress Notes (Signed)
Cardiology Office Note  Date: 12/25/2017   ID: [name], DOB 05-14-1970, MRN [id number]  PCP: Brandon Sites, MD  Primary Cardiologist: Brandon Lesches, MD   Chief Complaint  Patient presents with  . Cardiac follow-up    History of Present Illness: [name] is a 47 y.o. male that I saw in consultation back in May.  He presents for a routine follow-up visit.  Since last encounter he does not report any progressive dyspnea on exertion.  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  Mainly notices shortness of breath when he climbs up a ladder carrying a bag, this is often worse during the winter months.  Exercise Myoview done back in May is outlined below, low risk in terms of GXT and also without focal perfusion defects, although TID ratio was increased.  We went over the results today.  He still smoking cigarettes, we discussed smoking cessation.  He has been trying to cut back, also using nicotine patches but had a hard time keeping these on.  I reviewed his medications which are outlined below.  He reports compliance and continues to follow with Dr. Hilma Vazquez.  Past Medical History:  Diagnosis Date  . CKD (chronic kidney disease), stage II   . Deafness    Left ear only. Congenital.  . GERD (gastroesophageal reflux disease)   . History of influenza   . Hyperlipidemia   . Hypertension   . Thrombocytopenia ([place])   . Type 2 diabetes mellitus ([place])   . Vitamin B12 deficiency     Past Surgical History:  Procedure Laterality Date  . HEMORRHOID SURGERY    . INFUSION PUMP IMPLANTATION     Insulin  . NASAL SEPTUM SURGERY      Current Outpatient Medications  Medication Sig Dispense Refill  . losartan (COZAAR) 100 MG tablet Take 100 mg daily by mouth.  1  . Multiple Vitamins-Minerals (CENTRUM PO) Take 1 tablet by mouth daily.    Marland Kitchen NOVOLOG 100 UNIT/ML injection Inject into the skin.     . pantoprazole (PROTONIX) 40 MG tablet Take 40 mg daily by mouth.  0  .  simvastatin (ZOCOR) 20 MG tablet Take 20 mg every morning by mouth.      No current facility-administered medications for this visit.    Allergies:  Codeine and Glucophage [metformin hydrochloride]   Social History: The patient  reports that he has been smoking cigarettes. He has a 40.50 pack-year smoking history. He has never used smokeless tobacco. He reports that he drinks alcohol. He reports that he does not use drugs.   ROS:  Please see the history of present illness. Otherwise, complete review of systems is positive for none.  All other systems are reviewed and negative.   Physical Exam: VS:  BP 98/60   Pulse 90   Ht 5\' 3"  (1.6 m)   Wt 178 lb (80.7 kg)   SpO2 98%   BMI 31.53 kg/m , BMI Body mass index is 31.53 kg/m.  Wt Readings from Last 3 Encounters:  12/25/17 178 lb (80.7 kg)  09/04/17 171 lb (77.6 kg)  03/16/17 178 lb 1.6 oz (80.8 kg)    General: Patient appears comfortable at rest. HEENT: Conjunctiva and lids normal, oropharynx clear. Neck: Supple, no elevated JVP or carotid bruits, no thyromegaly. Lungs: Clear to auscultation, nonlabored breathing at rest. Cardiac: Regular rate and rhythm, S4, no significant systolic murmur, no pericardial rub. Abdomen: Soft, nontender, bowel sounds present. Extremities: No pitting edema,  distal pulses 2+. Skin: Warm and dry. Musculoskeletal: No kyphosis. Neuropsychiatric: Alert and oriented x3, affect grossly appropriate.  ECG: I personally reviewed the tracing from 09/04/2017 which showed normal sinus rhythm.  Recent Labwork: 03/17/2017: BUN 20; Creatinine, Ser 1.09; Hemoglobin 15.4; Platelets 140; Potassium 4.2; Sodium 142   Other Studies Reviewed Today:  Exercise Myoview 09/18/2017:  No significant myocardial perfusion defects to indicate focal scar or ischemia. Borderline increased TID ratio of 1.27, balanced or subendocardial ischemia not excluded.  No diagnostic ST segment changes to indicate ischemia. No chest pain was  reported. Dyspnea described at peak exercise. No inducible arrhythmias. Low risk Duke treadmill score of 9.5.  Blood pressure demonstrated a normal response to exercise.  This is a low risk study based on GXT.  Nuclear stress EF: 70%.  Assessment and Plan:  1.  Stable dyspnea on exertion.  Exercise Myoview from May is outlined above.  We discussed continued observation on medical therapy for now.  Certainly if symptoms worsen we can always pursue further evaluation.  We have discussed the possibility of a diagnostic cardiac catheterization.  2.  Ongoing tobacco abuse.  We have discussed smoking cessation.  He does seem to be motivated and is trying to cut back at this point.  3.  Type 2 diabetes mellitus on insulin pump.  He follows with Dr. Hilma Vazquez.  4.  CKD stage II based on records.  Current medicines were reviewed with the patient today.   Disposition: Follow-up in 1 year, sooner if needed.  Signed, Brandon Sark, MD, [facility] 12/25/2017 1:06 PM    [facility] at [facility] [address], [place], [place] [address] Phone: [phone]; Fax: [phone]

## 2018-03-09 DIAGNOSIS — E109 Type 1 diabetes mellitus without complications: Secondary | ICD-10-CM | POA: Diagnosis not present

## 2018-03-09 DIAGNOSIS — E119 Type 2 diabetes mellitus without complications: Secondary | ICD-10-CM | POA: Diagnosis not present

## 2018-03-09 DIAGNOSIS — Z794 Long term (current) use of insulin: Secondary | ICD-10-CM | POA: Diagnosis not present

## 2018-04-09 DIAGNOSIS — E7800 Pure hypercholesterolemia, unspecified: Secondary | ICD-10-CM | POA: Diagnosis not present

## 2018-04-09 DIAGNOSIS — I10 Essential (primary) hypertension: Secondary | ICD-10-CM | POA: Diagnosis not present

## 2018-04-09 DIAGNOSIS — E109 Type 1 diabetes mellitus without complications: Secondary | ICD-10-CM | POA: Diagnosis not present

## 2018-05-21 DIAGNOSIS — E7849 Other hyperlipidemia: Secondary | ICD-10-CM | POA: Diagnosis not present

## 2018-05-21 DIAGNOSIS — Z1389 Encounter for screening for other disorder: Secondary | ICD-10-CM | POA: Diagnosis not present

## 2018-05-21 DIAGNOSIS — E663 Overweight: Secondary | ICD-10-CM | POA: Diagnosis not present

## 2018-05-21 DIAGNOSIS — E109 Type 1 diabetes mellitus without complications: Secondary | ICD-10-CM | POA: Diagnosis not present

## 2018-05-21 DIAGNOSIS — Z6829 Body mass index (BMI) 29.0-29.9, adult: Secondary | ICD-10-CM | POA: Diagnosis not present

## 2018-05-21 DIAGNOSIS — E538 Deficiency of other specified B group vitamins: Secondary | ICD-10-CM | POA: Diagnosis not present

## 2019-05-09 NOTE — Progress Notes (Signed)
Cardiology Office Note  Date: 05/10/2019   ID: [name], DOB 1970/10/10, MRN [id number]  PCP:  Sharilyn Sites, MD  Cardiologist:  Rozann Lesches, MD Electrophysiologist:  None   Chief Complaint  Patient presents with  . Cardiac follow-up    History of Present Illness: [name] is a 49 y.o. male last seen in August 2019.  He presents for a routine follow-up visit.  He does not report any unusual degree of shortness of breath with activity, no exertional chest pain.  He has a chronic, musculoskeletal sounding left shoulder pain.  States that he has had previous steroid injections in that shoulder and has trouble with his rotator cuff.  He continues to follow with endocrinology for management of type 2 diabetes mellitus.  I reviewed his medications which are outlined below.  We are requesting lab work for review.  I personally reviewed his ECG today which shows normal sinus rhythm with nonspecific ST changes and lead motion artifact.  He is currently working on the night shift driving a forklift, loading trucks.  Past Medical History:  Diagnosis Date  . CKD (chronic kidney disease), stage II   . Deafness    Left ear only. Congenital.  . GERD (gastroesophageal reflux disease)   . History of influenza   . Hyperlipidemia   . Hypertension   . Thrombocytopenia ([place])   . Type 2 diabetes mellitus ([place])   . Vitamin B12 deficiency     Past Surgical History:  Procedure Laterality Date  . HEMORRHOID SURGERY    . INFUSION PUMP IMPLANTATION     Insulin  . NASAL SEPTUM SURGERY      Current Outpatient Medications  Medication Sig Dispense Refill  . losartan (COZAAR) 100 MG tablet Take 100 mg daily by mouth.  1  . Multiple Vitamins-Minerals (CENTRUM PO) Take 1 tablet by mouth daily.    Marland Kitchen NOVOLOG 100 UNIT/ML injection Inject into the skin.     . pantoprazole (PROTONIX) 40 MG tablet Take 40 mg daily by mouth.  0  . simvastatin (ZOCOR) 20 MG tablet Take 20 mg every morning by  mouth.      No current facility-administered medications for this visit.   Allergies:  Codeine and Glucophage [metformin hydrochloride]   Social History: The patient  reports that he has been smoking cigarettes. He has a 40.50 pack-year smoking history. He has never used smokeless tobacco. He reports current alcohol use. He reports that he does not use drugs.   ROS:  Please see the history of present illness. Otherwise, complete review of systems is positive for none.  All other systems are reviewed and negative.   Physical Exam: VS:  BP 113/73   Pulse 94   Temp 98.7 F (37.1 C)   Ht 5\' 4"  (1.626 m)   Wt 180 lb (81.6 kg)   SpO2 95%   BMI 30.90 kg/m , BMI Body mass index is 30.9 kg/m.  Wt Readings from Last 3 Encounters:  05/10/19 180 lb (81.6 kg)  12/25/17 178 lb (80.7 kg)  09/04/17 171 lb (77.6 kg)    General: Patient appears comfortable at rest. HEENT: Conjunctiva and lids normal, wearing a face covering. Neck: Supple, no elevated JVP or carotid bruits, no thyromegaly. Lungs: Clear to auscultation, nonlabored breathing at rest. Cardiac: Regular rate and rhythm, no S3 or significant systolic murmur. Abdomen: Soft, nontender, bowel sounds present. Extremities: No pitting edema, distal pulses 2+. Skin: Warm and dry. Musculoskeletal: No kyphosis. Neuropsychiatric: Alert  and oriented x3, affect grossly appropriate.  ECG:  An ECG dated 09/04/2017 was personally reviewed today and demonstrated:  Normal sinus rhythm.  Recent Labwork:  No interval lab work for review today.  Other Studies Reviewed Today:  Exercise Myoview 09/18/2017:  No significant myocardial perfusion defects to indicate focal scar or ischemia. Borderline increased TID ratio of 1.27, balanced or subendocardial ischemia not excluded.  No diagnostic ST segment changes to indicate ischemia. No chest pain was reported. Dyspnea described at peak exercise. No inducible arrhythmias. Low risk Duke treadmill score  of 9.5.  Blood pressure demonstrated a normal response to exercise.  This is a low risk study based on GXT.  Nuclear stress EF: 70%.  Assessment and Plan:  1.  History of dyspnea on exertion, no reported symptomatology at this time with typical ADLs.  He underwent ischemic testing in 2019 as reviewed above with plan for observation and risk factor modification.  Smoking cessation has been recommended.  He also continues on ARB and statin therapy with follow-up by endocrinology for management of diabetes.  ECG today reviewed.  2.  Type 2 diabetes mellitus on insulin pump.  Keep follow-up with endocrinology.  Requesting interval lab work for review.  Medication Adjustments/Labs and Tests Ordered: Current medicines are reviewed at length with the patient today.  Concerns regarding medicines are outlined above.   Tests Ordered: Orders Placed This Encounter  Procedures  . EKG 12-Lead    Medication Changes: No orders of the defined types were placed in this encounter.   Disposition:  Follow up 1 year in the [place] office.  Signed, Satira Sark, MD, [facility] 05/10/2019 9:53 AM    [place] [facility] [place]. [address], [place],  [address] Phone: [phone]; Fax: [phone]

## 2019-05-10 ENCOUNTER — Ambulatory Visit: Payer: 59 | Admitting: Cardiology

## 2019-05-10 ENCOUNTER — Encounter: Payer: Self-pay | Admitting: Cardiology

## 2019-05-10 VITALS — BP 113/73 | HR 94 | Temp 98.7°F | Ht 64.0 in | Wt 180.0 lb

## 2019-05-10 DIAGNOSIS — R0609 Other forms of dyspnea: Secondary | ICD-10-CM

## 2019-05-10 DIAGNOSIS — Z720 Tobacco use: Secondary | ICD-10-CM

## 2019-05-10 DIAGNOSIS — R0600 Dyspnea, unspecified: Secondary | ICD-10-CM | POA: Diagnosis not present

## 2019-05-10 DIAGNOSIS — N182 Chronic kidney disease, stage 2 (mild): Secondary | ICD-10-CM

## 2019-05-10 NOTE — Patient Instructions (Signed)
Medication Instructions:  Your physician recommends that you continue on your current medications as directed. Please refer to the Current Medication list given to you today.  *If you need a refill on your cardiac medications before your next appointment, please call your pharmacy*  Lab Work: NONE If you have labs (blood work) drawn today and your tests are completely normal, you will receive your results only by: . MyChart Message (if you have MyChart) OR . A paper copy in the mail If you have any lab test that is abnormal or we need to change your treatment, we will call you to review the results.  Testing/Procedures: NONE  Follow-Up: At [facility], you and your health needs are our priority.  As part of our continuing mission to provide you with exceptional heart care, we have created designated Provider Care Teams.  These Care Teams include your primary Cardiologist (physician) and Advanced Practice Providers (APPs -  Physician Assistants and Nurse Practitioners) who all work together to provide you with the care you need, when you need it.  Your next appointment:   12 month(s)  The format for your next appointment:   In Person  Provider:   Samuel McDowell, MD  Other Instructions NONE     Thank you for choosing [facility] !         

## 2019-10-05 ENCOUNTER — Encounter (INDEPENDENT_AMBULATORY_CARE_PROVIDER_SITE_OTHER): Payer: Self-pay | Admitting: *Deleted

## 2019-11-18 ENCOUNTER — Other Ambulatory Visit (INDEPENDENT_AMBULATORY_CARE_PROVIDER_SITE_OTHER): Payer: Self-pay | Admitting: *Deleted

## 2019-11-18 DIAGNOSIS — Z1211 Encounter for screening for malignant neoplasm of colon: Secondary | ICD-10-CM

## 2019-11-29 ENCOUNTER — Encounter (INDEPENDENT_AMBULATORY_CARE_PROVIDER_SITE_OTHER): Payer: Self-pay | Admitting: *Deleted

## 2019-11-29 ENCOUNTER — Telehealth (INDEPENDENT_AMBULATORY_CARE_PROVIDER_SITE_OTHER): Payer: Self-pay | Admitting: *Deleted

## 2019-11-29 NOTE — Telephone Encounter (Signed)
Patient needs Plenvu (copay card) ° °

## 2019-11-29 NOTE — Telephone Encounter (Signed)
Patient is scheduled for screeing TCS 01/04/20 - he is on novolog insulin pump - will he need to adjust, if so how? thanks

## 2019-11-30 MED ORDER — PLENVU 140 G PO SOLR: 1.0000 | Freq: Once | ORAL | 0 refills | Status: AC

## 2019-11-30 NOTE — Telephone Encounter (Signed)
Can you check w/ Dr Laural Golden on this of what he prefers?

## 2019-12-01 NOTE — Telephone Encounter (Signed)
Dr C can you help me with this - Patient is scheduled for screeing TCS 01/04/20 - he is on novolog insulin pump - will he need to adjust, if so how? thanks

## 2019-12-01 NOTE — Telephone Encounter (Signed)
Hi Ann, He needs to use the recommendations that his endocrinologist has given him before any procedures. He needs to put use the "basal rate" settings on the day of the procedure. We will check his blood sugar once he comes and after the procedure as well.  Thanks,  Maylon Peppers, MD Gastroenterology and Hepatology [facility]

## 2019-12-05 ENCOUNTER — Telehealth (INDEPENDENT_AMBULATORY_CARE_PROVIDER_SITE_OTHER): Payer: Self-pay | Admitting: *Deleted

## 2019-12-05 NOTE — Telephone Encounter (Signed)
Referring MD/PCP: golding   Procedure: tcs w mac  Reason/Indication:  screening  Has patient had this procedure before?  no  If so, when, by whom and where?    Is there a family history of colon cancer?  no  Who?  What age when diagnosed?    Is patient diabetic?   yes      Does patient have prosthetic heart valve or mechanical valve?  no  Do you have a pacemaker/defibrillator?  no  Has patient ever had endocarditis/atrial fibrillation? no  Does patient use oxygen? no  Has patient had joint replacement within last 12 months?  no  Is patient constipated or do they take laxatives? no  Does patient have a history of alcohol/drug use?  no  Is patient on blood thinner such as Coumadin, Plavix and/or Aspirin? no  Medications: one a day vitamin, novolog insulin, pantoprazole 40 mg daily, losartan 100 mg daily, simvastatin 40 mg daily  Allergies: codeine   Medication Adjustment per Dr Rehman/Dr Jenetta Downer   Procedure date & time: 01/04/20

## 2019-12-20 ENCOUNTER — Other Ambulatory Visit (INDEPENDENT_AMBULATORY_CARE_PROVIDER_SITE_OTHER): Payer: Self-pay | Admitting: *Deleted

## 2019-12-26 NOTE — Patient Instructions (Addendum)
[name]  12/26/2019     @PREFPERIOPPHARMACY @   Your procedure is scheduled on  01/04/2020.  Report to [name] at  [phone]  A.M.  Call this number if you have problems the morning of surgery:  [phone]   Remember:              Follow the diet and prep instructions given to you by the office.                        Take these medicines the morning of surgery with A SIP OF WATER  Protonix. Take 1/2 of your night time insulin the night before your procedure. DO NOT take any medications for diabetes the morning of your procedure.    Do not wear jewelry, make-up or nail polish.  Do not wear lotions, powders, or perfumes. Please wear deodorant and brush your teeth.  Do not shave 48 hours prior to surgery.  Men may shave face and neck.  Do not bring valuables to the hospital.  [facility] is not responsible for any belongings or valuables.  Contacts, dentures or bridgework may not be worn into surgery.  Leave your suitcase in the car.  After surgery it may be brought to your room.  For patients admitted to the hospital, discharge time will be determined by your treatment team.  Patients discharged the day of surgery will not be allowed to drive home.   Name and phone number of your driver:   family Special instructions:  DO NOT smoke the morning of your procedure.  Please read over the following fact sheets that you were given. Anesthesia Post-op Instructions and Care and Recovery After Surgery       Colonoscopy, Adult, Care After This sheet gives you information about how to care for yourself after your procedure. Your health care provider may also give you more specific instructions. If you have problems or questions, contact your health care provider. What can I expect after the procedure? After the procedure, it is common to have:  A small amount of blood in your stool for 24 hours after the procedure.  Some gas.  Mild cramping or bloating of your  abdomen. Follow these instructions at home: Eating and drinking   Drink enough fluid to keep your urine pale yellow.  Follow instructions from your health care provider about eating or drinking restrictions.  Resume your normal diet as instructed by your health care provider. Avoid heavy or fried foods that are hard to digest. Activity  Rest as told by your health care provider.  Avoid sitting for a long time without moving. Get up to take short walks every 1-2 hours. This is important to improve blood flow and breathing. Ask for help if you feel weak or unsteady.  Return to your normal activities as told by your health care provider. Ask your health care provider what activities are safe for you. Managing cramping and bloating   Try walking around when you have cramps or feel bloated.  Apply heat to your abdomen as told by your health care provider. Use the heat source that your health care provider recommends, such as a moist heat pack or a heating pad. ? Place a towel between your skin and the heat source. ? Leave the heat on for 20-30 minutes. ? Remove the heat if your skin turns bright red. This is especially important if you are unable to  feel pain, heat, or cold. You may have a greater risk of getting burned. General instructions  For the first 24 hours after the procedure: ? Do not drive or use machinery. ? Do not sign important documents. ? Do not drink alcohol. ? Do your regular daily activities at a slower pace than normal. ? Eat soft foods that are easy to digest.  Take over-the-counter and prescription medicines only as told by your health care provider.  Keep all follow-up visits as told by your health care provider. This is important. Contact a health care provider if:  You have blood in your stool 2-3 days after the procedure. Get help right away if you have:  More than a small spotting of blood in your stool.  Large blood clots in your stool.  Swelling  of your abdomen.  Nausea or vomiting.  A fever.  Increasing pain in your abdomen that is not relieved with medicine. Summary  After the procedure, it is common to have a small amount of blood in your stool. You may also have mild cramping and bloating of your abdomen.  For the first 24 hours after the procedure, do not drive or use machinery, sign important documents, or drink alcohol.  Get help right away if you have a lot of blood in your stool, nausea or vomiting, a fever, or increased pain in your abdomen. This information is not intended to replace advice given to you by your health care provider. Make sure you discuss any questions you have with your health care provider. Document Revised: 11/08/2018 Document Reviewed: 11/08/2018 Elsevier Patient Education  [place] After These instructions provide you with information about caring for yourself after your procedure. Your health care provider may also give you more specific instructions. Your treatment has been planned according to current medical practices, but problems sometimes occur. Call your health care provider if you have any problems or questions after your procedure. What can I expect after the procedure? After your procedure, you may:  Feel sleepy for several hours.  Feel clumsy and have poor balance for several hours.  Feel forgetful about what happened after the procedure.  Have poor judgment for several hours.  Feel nauseous or vomit.  Have a sore throat if you had a breathing tube during the procedure. Follow these instructions at home: For at least 24 hours after the procedure:      Have a responsible adult stay with you. It is important to have someone help care for you until you are awake and alert.  Rest as needed.  Do not: ? Participate in activities in which you could fall or become injured. ? Drive. ? Use heavy machinery. ? Drink alcohol. ? Take  sleeping pills or medicines that cause drowsiness. ? Make important decisions or sign legal documents. ? Take care of children on your own. Eating and drinking  Follow the diet that is recommended by your health care provider.  If you vomit, drink water, juice, or soup when you can drink without vomiting.  Make sure you have little or no nausea before eating solid foods. General instructions  Take over-the-counter and prescription medicines only as told by your health care provider.  If you have sleep apnea, surgery and certain medicines can increase your risk for breathing problems. Follow instructions from your health care provider about wearing your sleep device: ? Anytime you are sleeping, including during daytime naps. ? While taking prescription pain medicines, sleeping medicines,  or medicines that make you drowsy.  If you smoke, do not smoke without supervision.  Keep all follow-up visits as told by your health care provider. This is important. Contact a health care provider if:  You keep feeling nauseous or you keep vomiting.  You feel light-headed.  You develop a rash.  You have a fever. Get help right away if:  You have trouble breathing. Summary  For several hours after your procedure, you may feel sleepy and have poor judgment.  Have a responsible adult stay with you for at least 24 hours or until you are awake and alert. This information is not intended to replace advice given to you by your health care provider. Make sure you discuss any questions you have with your health care provider. Document Revised: 07/13/2017 Document Reviewed: 08/05/2015 Elsevier Patient Education  [place].

## 2020-01-03 ENCOUNTER — Other Ambulatory Visit (HOSPITAL_COMMUNITY)
Admission: RE | Admit: 2020-01-03 | Discharge: 2020-01-03 | Disposition: A | Discharge status: Home / Self Care | Payer: 59 | Source: Ambulatory Visit | Attending: Internal Medicine | Admitting: Internal Medicine

## 2020-01-03 ENCOUNTER — Other Ambulatory Visit: Payer: Self-pay

## 2020-01-03 ENCOUNTER — Encounter (HOSPITAL_COMMUNITY): Payer: Self-pay

## 2020-01-03 ENCOUNTER — Encounter (HOSPITAL_COMMUNITY)
Admission: RE | Admit: 2020-01-03 | Discharge: 2020-01-03 | Disposition: A | Discharge status: Home / Self Care | Payer: 59 | Source: Ambulatory Visit | Attending: Internal Medicine | Admitting: Internal Medicine

## 2020-01-03 DIAGNOSIS — Z1211 Encounter for screening for malignant neoplasm of colon: Secondary | ICD-10-CM | POA: Insufficient documentation

## 2020-01-03 DIAGNOSIS — Z01812 Encounter for preprocedural laboratory examination: Secondary | ICD-10-CM | POA: Insufficient documentation

## 2020-01-03 DIAGNOSIS — Z20822 Contact with and (suspected) exposure to covid-19: Secondary | ICD-10-CM | POA: Insufficient documentation

## 2020-01-03 HISTORY — DX: Sleep apnea, unspecified: G47.30

## 2020-01-03 LAB — BASIC METABOLIC PANEL
Anion gap: 11 (ref 5–15)
BUN: 20 mg/dL (ref 6–20)
CO2: 24 mmol/L (ref 22–32)
Calcium: 9.1 mg/dL (ref 8.9–10.3)
Chloride: 105 mmol/L (ref 98–111)
Creatinine, Ser: 1.17 mg/dL (ref 0.61–1.24)
GFR calc Af Amer: >60 mL/min (ref >60)
GFR calc non Af Amer: >60 mL/min (ref >60)
Glucose, Bld: 67 mg/dL — ABNORMAL LOW (ref 70–99)
Potassium: 3.5 mmol/L (ref 3.5–5.1)
Sodium: 140 mmol/L (ref 135–145)

## 2020-01-03 LAB — CBC WITH DIFFERENTIAL/PLATELET
Abs Immature Granulocytes: 0.02 10*3/uL (ref 0.00–0.07)
Basophils Absolute: 0.1 10*3/uL (ref 0.0–0.1)
Basophils Relative: 1 %
Eosinophils Absolute: 0.2 10*3/uL (ref 0.0–0.5)
Eosinophils Relative: 2 %
HCT: 45.1 % (ref 39.0–52.0)
Hemoglobin: 15.2 g/dL (ref 13.0–17.0)
Immature Granulocytes: 0 %
Lymphocytes Relative: 38 %
Lymphs Abs: 3.4 10*3/uL (ref 0.7–4.0)
MCH: 30.5 pg (ref 26.0–34.0)
MCHC: 33.7 g/dL (ref 30.0–36.0)
MCV: 90.6 fL (ref 80.0–100.0)
Monocytes Absolute: 1 10*3/uL (ref 0.1–1.0)
Monocytes Relative: 11 %
Neutro Abs: 4.4 10*3/uL (ref 1.7–7.7)
Neutrophils Relative %: 48 %
Platelets: 180 10*3/uL (ref 150–400)
RBC: 4.98 MIL/uL (ref 4.22–5.81)
RDW: 12 % (ref 11.5–15.5)
WBC: 9 10*3/uL (ref 4.0–10.5)
nRBC: 0 % (ref 0.0–0.2)

## 2020-01-03 LAB — SARS CORONAVIRUS 2 (TAT 6-24 HRS): SARS Coronavirus 2: NEGATIVE

## 2020-01-04 ENCOUNTER — Ambulatory Visit (HOSPITAL_COMMUNITY): Payer: 59 | Admitting: Certified Registered"

## 2020-01-04 ENCOUNTER — Encounter (HOSPITAL_COMMUNITY)
Admission: RE | Disposition: A | Discharge status: Home / Self Care | Payer: Self-pay | Source: Home / Self Care | Attending: Internal Medicine

## 2020-01-04 ENCOUNTER — Ambulatory Visit (HOSPITAL_COMMUNITY)
Admission: RE | Admit: 2020-01-04 | Discharge: 2020-01-04 | Disposition: A | Discharge status: Home / Self Care | Payer: 59 | Attending: Internal Medicine | Admitting: Internal Medicine

## 2020-01-04 ENCOUNTER — Encounter (HOSPITAL_COMMUNITY): Payer: Self-pay | Admitting: Internal Medicine

## 2020-01-04 DIAGNOSIS — Z1211 Encounter for screening for malignant neoplasm of colon: Secondary | ICD-10-CM | POA: Insufficient documentation

## 2020-01-04 DIAGNOSIS — E785 Hyperlipidemia, unspecified: Secondary | ICD-10-CM | POA: Insufficient documentation

## 2020-01-04 DIAGNOSIS — D125 Benign neoplasm of sigmoid colon: Secondary | ICD-10-CM

## 2020-01-04 DIAGNOSIS — K635 Polyp of colon: Secondary | ICD-10-CM | POA: Insufficient documentation

## 2020-01-04 DIAGNOSIS — K5730 Diverticulosis of large intestine without perforation or abscess without bleeding: Secondary | ICD-10-CM | POA: Diagnosis not present

## 2020-01-04 DIAGNOSIS — E1122 Type 2 diabetes mellitus with diabetic chronic kidney disease: Secondary | ICD-10-CM | POA: Diagnosis not present

## 2020-01-04 DIAGNOSIS — N182 Chronic kidney disease, stage 2 (mild): Secondary | ICD-10-CM | POA: Insufficient documentation

## 2020-01-04 DIAGNOSIS — I129 Hypertensive chronic kidney disease with stage 1 through stage 4 chronic kidney disease, or unspecified chronic kidney disease: Secondary | ICD-10-CM | POA: Diagnosis not present

## 2020-01-04 DIAGNOSIS — Z794 Long term (current) use of insulin: Secondary | ICD-10-CM | POA: Diagnosis not present

## 2020-01-04 DIAGNOSIS — G4730 Sleep apnea, unspecified: Secondary | ICD-10-CM | POA: Diagnosis not present

## 2020-01-04 DIAGNOSIS — Z79899 Other long term (current) drug therapy: Secondary | ICD-10-CM | POA: Insufficient documentation

## 2020-01-04 DIAGNOSIS — K219 Gastro-esophageal reflux disease without esophagitis: Secondary | ICD-10-CM | POA: Diagnosis not present

## 2020-01-04 DIAGNOSIS — F17210 Nicotine dependence, cigarettes, uncomplicated: Secondary | ICD-10-CM | POA: Diagnosis not present

## 2020-01-04 DIAGNOSIS — D696 Thrombocytopenia, unspecified: Secondary | ICD-10-CM | POA: Insufficient documentation

## 2020-01-04 DIAGNOSIS — K644 Residual hemorrhoidal skin tags: Secondary | ICD-10-CM | POA: Diagnosis not present

## 2020-01-04 DIAGNOSIS — Z9641 Presence of insulin pump (external) (internal): Secondary | ICD-10-CM | POA: Diagnosis not present

## 2020-01-04 HISTORY — PX: COLONOSCOPY WITH PROPOFOL: SHX5780

## 2020-01-04 HISTORY — PX: POLYPECTOMY: SHX5525

## 2020-01-04 LAB — GLUCOSE, CAPILLARY
Glucose-Capillary: 114 mg/dL — ABNORMAL HIGH (ref 70–99)
Glucose-Capillary: 91 mg/dL (ref 70–99)

## 2020-01-04 LAB — HM COLONOSCOPY

## 2020-01-04 SURGERY — COLONOSCOPY WITH PROPOFOL
Anesthesia: General

## 2020-01-04 MED ORDER — LACTATED RINGERS IV SOLN: INTRAVENOUS | Status: DC

## 2020-01-04 MED ORDER — CHLORHEXIDINE GLUCONATE CLOTH 2 % EX PADS: 6.0000 | MEDICATED_PAD | Freq: Once | CUTANEOUS | Status: DC

## 2020-01-04 MED ORDER — LIDOCAINE 2% (20 MG/ML) 5 ML SYRINGE
INTRAMUSCULAR | Status: AC
  Filled 2020-01-04: qty 5

## 2020-01-04 MED ORDER — PROPOFOL 10 MG/ML IV BOLUS
INTRAVENOUS | Status: DC | PRN
  Administered 2020-01-04: 40 mg via INTRAVENOUS

## 2020-01-04 MED ORDER — PROPOFOL 500 MG/50ML IV EMUL
INTRAVENOUS | Status: DC | PRN
  Administered 2020-01-04: 125 ug/kg/min via INTRAVENOUS

## 2020-01-04 MED ORDER — PROPOFOL 10 MG/ML IV BOLUS
INTRAVENOUS | Status: AC
  Filled 2020-01-04: qty 80

## 2020-01-04 MED ORDER — LIDOCAINE HCL (CARDIAC) PF 100 MG/5ML IV SOSY
PREFILLED_SYRINGE | INTRAVENOUS | Status: DC | PRN
  Administered 2020-01-04: 60 mg via INTRAVENOUS

## 2020-01-04 MED ORDER — LACTATED RINGERS IV SOLN: INTRAVENOUS | Status: DC | PRN

## 2020-01-04 MED ORDER — STERILE WATER FOR IRRIGATION IR SOLN
Status: DC | PRN
  Administered 2020-01-04: 1.5 mL

## 2020-01-04 NOTE — Anesthesia Postprocedure Evaluation (Signed)
Anesthesia Post Note  Patient: [name] A [place]  Procedure(s) Performed: COLONOSCOPY WITH PROPOFOL (N/A ) POLYPECTOMY  Patient location during evaluation: PACU Anesthesia Type: General Level of consciousness: awake Pain management: pain level controlled Vital Signs Assessment: post-procedure vital signs reviewed and stable Respiratory status: spontaneous breathing Cardiovascular status: blood pressure returned to baseline Anesthetic complications: no   No complications documented.   Last Vitals:  Vitals:   01/04/20 0800 01/04/20 0811  BP: (!) 104/54 111/75  Pulse: 65 69  Resp: 18 18  Temp:  36.7 C  SpO2: 97% 100%    Last Pain:  Vitals:   01/04/20 0811  TempSrc: Oral  PainSc: 0-No pain                 [name]

## 2020-01-04 NOTE — H&P (Signed)
[name] is an 49 y.o. male.   Chief Complaint: Patient is here for colonoscopy. HPI: Patient is 49 year old Caucasian male who is here for screening colonoscopy.  This is patient's first exam.  He denies abdominal pain change in bowel habits or rectal bleeding.  He has good appetite.  He does not take aspirin or anticoagulants. Family history is negative for CRC. He is on insulin pump and dose has been reduced to 35% of his basal rate. Blood glucose is 114.  Past Medical History:  Diagnosis Date  . CKD (chronic kidney disease), stage II   . Deafness    Left ear only. Congenital.  . GERD (gastroesophageal reflux disease)   . History of influenza   . Hyperlipidemia   . Hypertension   . Sleep apnea   . Thrombocytopenia ([place])   . Type 2 diabetes mellitus ([place])   . Vitamin B12 deficiency     Past Surgical History:  Procedure Laterality Date  . HEMORRHOID SURGERY    . INFUSION PUMP IMPLANTATION     Insulin  . NASAL SEPTUM SURGERY      Family History  Problem Relation Age of Onset  . Hypertension Mother   . Hypertension Father   . Heart disease Father    Social History:  reports that he has been smoking cigarettes. He has a 40.50 pack-year smoking history. He has never used smokeless tobacco. He reports current alcohol use. He reports that he does not use drugs.  Allergies:  Allergies  Allergen Reactions  . Codeine Nausea Only    Drowsiness  . Glucophage [Metformin Hydrochloride] Other (See Comments)    Vomiting and cramps    Medications Prior to Admission  Medication Sig Dispense Refill  . losartan (COZAAR) 100 MG tablet Take 100 mg daily by mouth.  1  . Multiple Vitamins-Minerals (CENTRUM PO) Take 1 tablet by mouth daily.    Marland Kitchen NOVOLOG 100 UNIT/ML injection Inject 30 Units into the skin daily. In insulin pump    . pantoprazole (PROTONIX) 40 MG tablet Take 40 mg daily by mouth.  0  . simvastatin (ZOCOR) 40 MG tablet Take 40 mg by mouth daily.      Results for  orders placed or performed during the hospital encounter of 01/04/20 (from the past 48 hour(s))  Glucose, capillary     Status: Abnormal   Collection Time: 01/04/20  6:32 AM  Result Value Ref Range   Glucose-Capillary 114 (H) 70 - 99 mg/dL    Comment: Glucose reference range applies only to samples taken after fasting for at least 8 hours.   No results found.  Review of Systems  Blood pressure 113/78, temperature 97.9 F (36.6 C), temperature source Oral, resp. rate 17, SpO2 98 %. Physical Exam HENT:     Mouth/Throat:     Mouth: Mucous membranes are moist.     Pharynx: Oropharynx is clear.  Cardiovascular:     Rate and Rhythm: Normal rate and regular rhythm.     Heart sounds: Normal heart sounds. No murmur heard.   Pulmonary:     Effort: Pulmonary effort is normal.     Breath sounds: Normal breath sounds.  Abdominal:     Comments: Abdomen is full.  It is soft and nontender with organomegaly or masses.  Musculoskeletal:        General: No swelling.     Cervical back: Neck supple.  Lymphadenopathy:     Cervical: No cervical adenopathy.  Skin:    General:  Skin is warm and dry.  Neurological:     Mental Status: He is alert.      Assessment/Plan  Average risk screening colonoscopy.  Hildred Laser, MD 01/04/2020, 7:20 AM

## 2020-01-04 NOTE — Transfer of Care (Signed)
Immediate Anesthesia Transfer of Care Note  Patient: [name]  Procedure(s) Performed: COLONOSCOPY WITH PROPOFOL (N/A ) POLYPECTOMY  Patient Location: PACU  Anesthesia Type:MAC  Level of Consciousness: drowsy and patient cooperative  Airway & Oxygen Therapy: Patient Spontanous Breathing and Patient connected to nasal cannula oxygen  Post-op Assessment: Report given to RN and Post -op Vital signs reviewed and stable  Post vital signs: Reviewed and stable  Last Vitals:  Vitals Value Taken Time  BP    Temp    Pulse 85 01/04/20 0748  Resp 23 01/04/20 0748  SpO2 96 % 01/04/20 0748  Vitals shown include unvalidated device data.  Last Pain:  Vitals:   01/04/20 0729  TempSrc:   PainSc: 0-No pain      Patients Stated Pain Goal: 9 (16/10/96 0454)  Complications: No complications documented.

## 2020-01-04 NOTE — Op Note (Signed)
[facility] Patient Name: [name] Procedure Date: 01/04/2020 7:16 AM MRN: [id number] Date of Birth: 10/25/1970 Attending MD: Hildred Laser , MD CSN: [id number] Age: 49 Admit Type: Outpatient Procedure:                Colonoscopy Indications:              Screening for colorectal malignant neoplasm Providers:                Hildred Laser, MD, Otis Peak B. Sharon Seller, RN, Caprice Kluver, Kristine L. Risa Grill, Technician, Randa Spike, Technician Referring MD:             Halford Chessman MD, MD Medicines:                Propofol per Anesthesia Complications:            No immediate complications. Estimated Blood Loss:     Estimated blood loss was minimal. Procedure:                Pre-Anesthesia Assessment:                           - Prior to the procedure, a History and Physical                            was performed, and patient medications and                            allergies were reviewed. The patient's tolerance of                            previous anesthesia was also reviewed. The risks                            and benefits of the procedure and the sedation                            options and risks were discussed with the patient.                            All questions were answered, and informed consent                            was obtained. Prior Anticoagulants: The patient has                            taken no previous anticoagulant or antiplatelet                            agents. ASA Grade Assessment: III - A patient with  severe systemic disease. After reviewing the risks                            and benefits, the patient was deemed in                            satisfactory condition to undergo the procedure.                           After obtaining informed consent, the colonoscope                            was passed under direct vision. Throughout the                             procedure, the patient's blood pressure, pulse, and                            oxygen saturations were monitored continuously. The                            [id number]-[id number] ([id number]) scope was introduced through                            the anus and advanced to the the cecum, identified                            by appendiceal orifice and ileocecal valve. The                            colonoscopy was performed without difficulty. The                            patient tolerated the procedure well. The quality                            of the bowel preparation was excellent. The                            ileocecal valve, appendiceal orifice, and rectum                            were photographed. Scope In: 7:32:47 AM Scope Out: 7:45:27 AM Scope Withdrawal Time: 0 hours 10 minutes 35 seconds  Total Procedure Duration: 0 hours 12 minutes 40 seconds  Findings:      Skin tags were found on perianal exam.      Two diverticula were found in the mid sigmoid colon.      A small polyp was found in the mid sigmoid colon. The polyp was removed       with a cold snare. Resection and retrieval were complete.      External hemorrhoids were found during retroflexion. The hemorrhoids       were small. Impression:               -  Perianal skin tags found on perianal exam.                           - Diverticulosis in the mid sigmoid colon.                           - One small polyp in the mid sigmoid colon, removed                            with a cold snare. Resected and retrieved.                           - External hemorrhoids. Moderate Sedation:      Per Anesthesia Care Recommendation:           - Patient has a contact number available for                            emergencies. The signs and symptoms of potential                            delayed complications were discussed with the                            patient. Return to normal activities tomorrow.                             Written discharge instructions were provided to the                            patient.                           - High fiber diet and diabetic (ADA) diet today.                           - Continue present medications.                           - No aspirin, ibuprofen, naproxen, or other                            non-steroidal anti-inflammatory drugs for 1 day.                           - Await pathology results.                           - Repeat colonoscopy is recommended. The                            colonoscopy date will be determined after pathology                            results from today's exam become available for  review. Procedure Code(s):        --- Professional ---                           [phone], Colonoscopy, flexible; with removal of                            tumor(s), polyp(s), or other lesion(s) by snare                            technique Diagnosis Code(s):        --- Professional ---                           K64.4, Residual hemorrhoidal skin tags                           Z12.11, Encounter for screening for malignant                            neoplasm of colon                           K63.5, Polyp of colon                           K57.30, Diverticulosis of large intestine without                            perforation or abscess without bleeding CPT copyright 2019 American Medical Association. All rights reserved. The codes documented in this report are preliminary and upon coder review may  be revised to meet current compliance requirements. Hildred Laser, MD Hildred Laser, MD 01/04/2020 7:56:48 AM This report has been signed electronically. Number of Addenda: 0

## 2020-01-04 NOTE — Anesthesia Preprocedure Evaluation (Signed)
Anesthesia Evaluation  Patient identified by MRN, date of birth, ID band Patient awake    Reviewed: Allergy & Precautions, H&P , NPO status , Patient's Chart, lab work & pertinent test results, reviewed documented beta blocker date and time   Airway Mallampati: II  TM Distance: >3 FB Neck ROM: full    Dental no notable dental hx. (+) Teeth Intact, Poor Dentition   Pulmonary neg pulmonary ROS, Current Smoker and Patient abstained from smoking.,    Pulmonary exam normal breath sounds clear to auscultation       Cardiovascular Exercise Tolerance: Good hypertension, negative cardio ROS   Rhythm:regular Rate:Normal     Neuro/Psych negative neurological ROS  negative psych ROS   GI/Hepatic Neg liver ROS, GERD  Medicated,  Endo/Other  negative endocrine ROSdiabetes, Type 2, Insulin Dependent  Renal/GU CRFRenal disease  negative genitourinary   Musculoskeletal   Abdominal   Peds  Hematology negative hematology ROS (+)   Anesthesia Other Findings   Reproductive/Obstetrics negative OB ROS                             Anesthesia Physical Anesthesia Plan  ASA: III  Anesthesia Plan: General   Post-op Pain Management:    Induction:   PONV Risk Score and Plan: Propofol infusion  Airway Management Planned:   Additional Equipment:   Intra-op Plan:   Post-operative Plan:   Informed Consent: I have reviewed the patients History and Physical, chart, labs and discussed the procedure including the risks, benefits and alternatives for the proposed anesthesia with the patient or authorized representative who has indicated his/her understanding and acceptance.     Dental Advisory Given  Plan Discussed with: CRNA  Anesthesia Plan Comments:         Anesthesia Quick Evaluation

## 2020-01-04 NOTE — Discharge Instructions (Signed)
No aspirin or NSAIDs for 24 hours. Resume usual medications as before. Modified carb high-fiber diet. No driving for 24 hours. Physician will call with biopsy results.  Colonoscopy, Adult, Care After This sheet gives you information about how to care for yourself after your procedure. Your health care provider may also give you more specific instructions. If you have problems or questions, contact your health care provider. What can I expect after the procedure? After the procedure, it is common to have:  A small amount of blood in your stool for 24 hours after the procedure.  Some gas.  Mild cramping or bloating of your abdomen. Follow these instructions at home: Eating and drinking   Drink enough fluid to keep your urine pale yellow.  Follow instructions from your health care provider about eating or drinking restrictions.  Resume your normal diet as instructed by your health care provider. Avoid heavy or fried foods that are hard to digest. Activity  Rest as told by your health care provider.  Avoid sitting for a long time without moving. Get up to take short walks every 1-2 hours. This is important to improve blood flow and breathing. Ask for help if you feel weak or unsteady.  Return to your normal activities as told by your health care provider. Ask your health care provider what activities are safe for you. Managing cramping and bloating   Try walking around when you have cramps or feel bloated.  Apply heat to your abdomen as told by your health care provider. Use the heat source that your health care provider recommends, such as a moist heat pack or a heating pad. ? Place a towel between your skin and the heat source. ? Leave the heat on for 20-30 minutes. ? Remove the heat if your skin turns bright red. This is especially important if you are unable to feel pain, heat, or cold. You may have a greater risk of getting burned. General instructions  For the first 24  hours after the procedure: ? Do not drive or use machinery. ? Do not sign important documents. ? Do not drink alcohol. ? Do your regular daily activities at a slower pace than normal. ? Eat soft foods that are easy to digest.  Take over-the-counter and prescription medicines only as told by your health care provider.  Keep all follow-up visits as told by your health care provider. This is important. Contact a health care provider if:  You have blood in your stool 2-3 days after the procedure. Get help right away if you have:  More than a small spotting of blood in your stool.  Large blood clots in your stool.  Swelling of your abdomen.  Nausea or vomiting.  A fever.  Increasing pain in your abdomen that is not relieved with medicine. Summary  After the procedure, it is common to have a small amount of blood in your stool. You may also have mild cramping and bloating of your abdomen.  For the first 24 hours after the procedure, do not drive or use machinery, sign important documents, or drink alcohol.  Get help right away if you have a lot of blood in your stool, nausea or vomiting, a fever, or increased pain in your abdomen. This information is not intended to replace advice given to you by your health care provider. Make sure you discuss any questions you have with your health care provider. Document Revised: 11/08/2018 Document Reviewed: 11/08/2018 Elsevier Patient Education  [place].  Colon Polyps  Polyps are tissue growths inside the body. Polyps can grow in many places, including the large intestine (colon). A polyp may be a round bump or a mushroom-shaped growth. You could have one polyp or several. Most colon polyps are noncancerous (benign). However, some colon polyps can become cancerous over time. Finding and removing the polyps early can help prevent this. What are the causes? The exact cause of colon polyps is not known. What increases the risk? You  are more likely to develop this condition if you:  Have a family history of colon cancer or colon polyps.  Are older than 44 or older than 45 if you are African American.  Have inflammatory bowel disease, such as ulcerative colitis or Crohn's disease.  Have certain hereditary conditions, such as: ? Familial adenomatous polyposis. ? Lynch syndrome. ? Turcot syndrome. ? Peutz-Jeghers syndrome.  Are overweight.  Smoke cigarettes.  Do not get enough exercise.  Drink too much alcohol.  Eat a diet that is high in fat and red meat and low in fiber.  Had childhood cancer that was treated with abdominal radiation. What are the signs or symptoms? Most polyps do not cause symptoms. If you have symptoms, they may include:  Blood coming from your rectum when having a bowel movement.  Blood in your stool. The stool may look dark red or black.  Abdominal pain.  A change in bowel habits, such as constipation or diarrhea. How is this diagnosed? This condition is diagnosed with a colonoscopy. This is a procedure in which a lighted, flexible scope is inserted into the anus and then passed into the colon to examine the area. Polyps are sometimes found when a colonoscopy is done as part of routine cancer screening tests. How is this treated? Treatment for this condition involves removing any polyps that are found. Most polyps can be removed during a colonoscopy. Those polyps will then be tested for cancer. Additional treatment may be needed depending on the results of testing. Follow these instructions at home: Lifestyle  Maintain a healthy weight, or lose weight if recommended by your health care provider.  Exercise every day or as told by your health care provider.  Do not use any products that contain nicotine or tobacco, such as cigarettes and e-cigarettes. If you need help quitting, ask your health care provider.  If you drink alcohol, limit how much you have: ? 0-1 drink a day for  women. ? 0-2 drinks a day for men.  Be aware of how much alcohol is in your drink. In the [place] one drink equals one 12 oz bottle of beer (355 mL), one 5 oz glass of wine (148 mL), or one 1 oz shot of hard liquor (44 mL). Eating and drinking   Eat foods that are high in fiber, such as fruits, vegetables, and whole grains.  Eat foods that are high in calcium and vitamin D, such as milk, cheese, yogurt, eggs, liver, fish, and broccoli.  Limit foods that are high in fat, such as fried foods and desserts.  Limit the amount of red meat and processed meat you eat, such as hot dogs, sausage, bacon, and lunch meats. General instructions  Keep all follow-up visits as told by your health care provider. This is important. ? This includes having regularly scheduled colonoscopies. ? Talk to your health care provider about when you need a colonoscopy. Contact a health care provider if:  You have new or worsening bleeding during a bowel movement.  You have new or increased blood in your stool.  You have a change in bowel habits.  You lose weight for no known reason. Summary  Polyps are tissue growths inside the body. Polyps can grow in many places, including the colon.  Most colon polyps are noncancerous (benign), but some can become cancerous over time.  This condition is diagnosed with a colonoscopy.  Treatment for this condition involves removing any polyps that are found. Most polyps can be removed during a colonoscopy. This information is not intended to replace advice given to you by your health care provider. Make sure you discuss any questions you have with your health care provider. Document Revised: 07/30/2017 Document Reviewed: 07/30/2017 Elsevier Patient Education  [place].

## 2020-01-05 LAB — SURGICAL PATHOLOGY

## 2020-01-06 ENCOUNTER — Encounter (HOSPITAL_COMMUNITY): Payer: Self-pay | Admitting: Internal Medicine

## 2020-02-03 ENCOUNTER — Encounter (INDEPENDENT_AMBULATORY_CARE_PROVIDER_SITE_OTHER): Payer: Self-pay | Admitting: *Deleted

## 2020-04-10 ENCOUNTER — Emergency Department (HOSPITAL_COMMUNITY): Payer: 59

## 2020-04-10 ENCOUNTER — Emergency Department (HOSPITAL_COMMUNITY)
Admission: EM | Admit: 2020-04-10 | Discharge: 2020-04-10 | Disposition: A | Discharge status: Home / Self Care | Payer: 59 | Attending: Emergency Medicine | Admitting: Emergency Medicine

## 2020-04-10 ENCOUNTER — Other Ambulatory Visit: Payer: Self-pay

## 2020-04-10 ENCOUNTER — Encounter (HOSPITAL_COMMUNITY): Payer: Self-pay | Admitting: Emergency Medicine

## 2020-04-10 DIAGNOSIS — Z9641 Presence of insulin pump (external) (internal): Secondary | ICD-10-CM | POA: Diagnosis not present

## 2020-04-10 DIAGNOSIS — Z20822 Contact with and (suspected) exposure to covid-19: Secondary | ICD-10-CM | POA: Insufficient documentation

## 2020-04-10 DIAGNOSIS — F17210 Nicotine dependence, cigarettes, uncomplicated: Secondary | ICD-10-CM | POA: Insufficient documentation

## 2020-04-10 DIAGNOSIS — Z79899 Other long term (current) drug therapy: Secondary | ICD-10-CM | POA: Insufficient documentation

## 2020-04-10 DIAGNOSIS — I129 Hypertensive chronic kidney disease with stage 1 through stage 4 chronic kidney disease, or unspecified chronic kidney disease: Secondary | ICD-10-CM | POA: Insufficient documentation

## 2020-04-10 DIAGNOSIS — Z794 Long term (current) use of insulin: Secondary | ICD-10-CM | POA: Insufficient documentation

## 2020-04-10 DIAGNOSIS — N182 Chronic kidney disease, stage 2 (mild): Secondary | ICD-10-CM | POA: Diagnosis not present

## 2020-04-10 DIAGNOSIS — R059 Cough, unspecified: Secondary | ICD-10-CM | POA: Insufficient documentation

## 2020-04-10 DIAGNOSIS — E119 Type 2 diabetes mellitus without complications: Secondary | ICD-10-CM | POA: Insufficient documentation

## 2020-04-10 DIAGNOSIS — R197 Diarrhea, unspecified: Secondary | ICD-10-CM | POA: Diagnosis not present

## 2020-04-10 DIAGNOSIS — R112 Nausea with vomiting, unspecified: Secondary | ICD-10-CM | POA: Diagnosis present

## 2020-04-10 DIAGNOSIS — E860 Dehydration: Secondary | ICD-10-CM | POA: Insufficient documentation

## 2020-04-10 LAB — CBC
HCT: 47.1 % (ref 39.0–52.0)
Hemoglobin: 15.8 g/dL (ref 13.0–17.0)
MCH: 30.3 pg (ref 26.0–34.0)
MCHC: 33.5 g/dL (ref 30.0–36.0)
MCV: 90.2 fL (ref 80.0–100.0)
Platelets: 141 10*3/uL — ABNORMAL LOW (ref 150–400)
RBC: 5.22 MIL/uL (ref 4.22–5.81)
RDW: 11.9 % (ref 11.5–15.5)
WBC: 7.3 10*3/uL (ref 4.0–10.5)
nRBC: 0 % (ref 0.0–0.2)

## 2020-04-10 LAB — CBG MONITORING, ED: Glucose-Capillary: 163 mg/dL — ABNORMAL HIGH (ref 70–99)

## 2020-04-10 LAB — COMPREHENSIVE METABOLIC PANEL
ALT: 35 U/L (ref 0–44)
AST: 27 U/L (ref 15–41)
Albumin: 4 g/dL (ref 3.5–5.0)
Alkaline Phosphatase: 61 U/L (ref 38–126)
Anion gap: 10 (ref 5–15)
BUN: 16 mg/dL (ref 6–20)
CO2: 23 mmol/L (ref 22–32)
Calcium: 8.7 mg/dL — ABNORMAL LOW (ref 8.9–10.3)
Chloride: 103 mmol/L (ref 98–111)
Creatinine, Ser: 1.27 mg/dL — ABNORMAL HIGH (ref 0.61–1.24)
GFR, Estimated: >60 mL/min (ref >60)
Glucose, Bld: 179 mg/dL — ABNORMAL HIGH (ref 70–99)
Potassium: 3.8 mmol/L (ref 3.5–5.1)
Sodium: 136 mmol/L (ref 135–145)
Total Bilirubin: 0.8 mg/dL (ref 0.3–1.2)
Total Protein: 7 g/dL (ref 6.5–8.1)

## 2020-04-10 LAB — URINALYSIS, ROUTINE W REFLEX MICROSCOPIC
Bilirubin Urine: NEGATIVE
Glucose, UA: NEGATIVE mg/dL
Hgb urine dipstick: NEGATIVE
Ketones, ur: 5 mg/dL — AB
Leukocytes,Ua: NEGATIVE
Nitrite: NEGATIVE
Protein, ur: 100 mg/dL — AB
Specific Gravity, Urine: 1.036 — ABNORMAL HIGH (ref 1.005–1.030)
pH: 5 (ref 5.0–8.0)

## 2020-04-10 LAB — RESP PANEL BY RT-PCR (FLU A&B, COVID) ARPGX2
Influenza A by PCR: NEGATIVE
Influenza B by PCR: NEGATIVE
SARS Coronavirus 2 by RT PCR: NEGATIVE

## 2020-04-10 LAB — LIPASE, BLOOD: Lipase: 24 U/L (ref 11–51)

## 2020-04-10 LAB — MAGNESIUM: Magnesium: 1.7 mg/dL (ref 1.7–2.4)

## 2020-04-10 MED ORDER — SODIUM CHLORIDE 0.9 % IV BOLUS
1000.0000 mL | Freq: Once | INTRAVENOUS | Status: AC
  Administered 2020-04-10: 14:00:00 1000 mL via INTRAVENOUS

## 2020-04-10 MED ORDER — ONDANSETRON 4 MG PO TBDP: 4.0000 mg | ORAL_TABLET | Freq: Three times a day (TID) | ORAL | 0 refills | Status: DC | PRN

## 2020-04-10 MED ORDER — SODIUM CHLORIDE 0.9 % IV BOLUS: 500.0000 mL | Freq: Once | INTRAVENOUS | Status: DC

## 2020-04-10 MED ORDER — ONDANSETRON HCL 4 MG/2ML IJ SOLN
4.0000 mg | Freq: Once | INTRAMUSCULAR | Status: AC
  Administered 2020-04-10: 14:00:00 4 mg via INTRAVENOUS
  Filled 2020-04-10: qty 2

## 2020-04-10 NOTE — ED Provider Notes (Signed)
[facility] Provider Note   CSN: [id number] Arrival date & time: 04/10/20  1233     History Chief Complaint  Patient presents with  . Emesis    [name] is a 49 y.o. male with a hx of stage II CKD, GERD, hypertension, hyperlipidemia, T2DM, & sleep apnea who presents to the ED with complaints of N/V/D x 3 days. Patient reports nausea with approximately 5 episodes of emesis per day and too numerous to count episodes of diarrhea per day. Unable to keep things down. Saw PCP yesterday who prescribed him ciprofloxacin which he tried to take this AM but vomited, he thinks this was prescribe for UTI as he was having some dysuria yesterday which is resolved today, called PCP office this AM and was sent to the ED for assessment, patient states that this feels like when he had had ketonuria. He has also had a dry cough. Denies fever, chills, hematemesis, melena, hematochezia, constipation, dysuria, abdominal pain, chest pain, or dyspnea. Denies recent foreign travel or abx other than the ciprofloxacin than was prescribed to him yesterday.   HPI     Past Medical History:  Diagnosis Date  . CKD (chronic kidney disease), stage II   . Deafness    Left ear only. Congenital.  . GERD (gastroesophageal reflux disease)   . History of influenza   . Hyperlipidemia   . Hypertension   . Sleep apnea   . Thrombocytopenia ([place])   . Type 2 diabetes mellitus ([place])   . Vitamin B12 deficiency     Patient Active Problem List   Diagnosis Date Noted  . CAP (community acquired pneumonia) 03/16/2017  . Acute on chronic respiratory failure with hypoxia ([place]) 03/16/2017  . IDDM (insulin dependent diabetes mellitus) 03/16/2017  . Pain in limb 08/02/2013  . Salmonella gastroenteritis 02/10/2013  . Gastroenteritis 02/09/2013  . Fever 02/08/2013  . Nausea, vomiting and diarrhea 02/08/2013  . Wheezing 02/06/2012  . Acute bronchitis 02/04/2012  . DM type 2 (diabetes mellitus, type 2)  ([place]) 02/04/2012  . Insulin pump in place 02/04/2012  . Dehydration 02/04/2012  . Orthostatic hypotension 02/04/2012  . Hypoglycemia 04/19/2011  . Vitamin B12 deficiency 04/19/2011  . DKA, type 2 ([place]) 04/18/2011  . Hyponatremia 04/18/2011  . Acute renal failure ([place]) 04/18/2011  . CKD (chronic kidney disease), stage II 04/18/2011  . Tachycardia 04/18/2011  . Thrombocytopenia ([place]) 04/18/2011  . History of influenza 04/18/2011  . Tobacco abuse 04/18/2011  . Nausea and vomiting in adult 04/18/2011    Past Surgical History:  Procedure Laterality Date  . COLONOSCOPY WITH PROPOFOL N/A 01/04/2020   Procedure: COLONOSCOPY WITH PROPOFOL;  Surgeon: Rogene Houston, MD;  Location: [facility];  Service: Endoscopy;  Laterality: N/A;  730  . HEMORRHOID SURGERY    . INFUSION PUMP IMPLANTATION     Insulin  . NASAL SEPTUM SURGERY    . POLYPECTOMY  01/04/2020   Procedure: POLYPECTOMY;  Surgeon: Rogene Houston, MD;  Location: [facility];  Service: Endoscopy;;       Family History  Problem Relation Age of Onset  . Hypertension Mother   . Hypertension Father   . Heart disease Father     Social History   Tobacco Use  . Smoking status: Current Every Day Smoker    Packs/day: 1.50    Years: 27.00    Pack years: 40.50    Types: Cigarettes  . Smokeless tobacco: Never Used  Vaping Use  . Vaping  Use: Never used  Substance Use Topics  . Alcohol use: Yes    Comment: Occasional  . Drug use: No    Home Medications Prior to Admission medications   Medication Sig Start Date End Date Taking? Authorizing Provider  losartan (COZAAR) 100 MG tablet Take 100 mg daily by mouth. 03/13/17   [provider]  Multiple Vitamins-Minerals (CENTRUM PO) Take 1 tablet by mouth daily.    [provider]  NOVOLOG 100 UNIT/ML injection Inject 30 Units into the skin daily. In insulin pump 07/08/17   [provider]  pantoprazole (PROTONIX) 40 MG tablet Take 40 mg daily by mouth.  02/03/17   [provider]  simvastatin (ZOCOR) 40 MG tablet Take 40 mg by mouth daily.    [provider]    Allergies    Codeine and Glucophage [metformin hydrochloride]  Review of Systems   Review of Systems  Constitutional: Negative for chills and fever.  HENT: Negative for congestion, ear pain and sore throat.   Respiratory: Positive for cough. Negative for shortness of breath.   Cardiovascular: Negative for chest pain.  Gastrointestinal: Positive for diarrhea, nausea and vomiting. Negative for abdominal pain, anal bleeding, blood in stool and constipation.  Genitourinary: Negative for dysuria.  Neurological: Negative for syncope.  All other systems reviewed and are negative.   Physical Exam Updated Vital Signs BP 108/72 (BP Location: Left Arm)   Pulse (!) 118   Temp 99.6 F (37.6 C) (Oral)   Resp 20   Ht 5\' 2"  (1.575 m)   Wt 83 kg   SpO2 97%   BMI 33.47 kg/m   Physical Exam Vitals and nursing note reviewed.  Constitutional:      General: He is not in acute distress.    Appearance: He is well-developed and well-nourished. He is not toxic-appearing.  HENT:     Head: Normocephalic and atraumatic.  Eyes:     General:        Right eye: No discharge.        Left eye: No discharge.     Conjunctiva/sclera: Conjunctivae normal.  Cardiovascular:     Rate and Rhythm: Regular rhythm. Tachycardia present.  Pulmonary:     Effort: Pulmonary effort is normal. No respiratory distress.     Breath sounds: Normal breath sounds. No wheezing, rhonchi or rales.     Comments: Respiration even and unlabored Abdominal:     General: There is no distension.     Palpations: Abdomen is soft.     Tenderness: There is no abdominal tenderness. There is no right CVA tenderness, left CVA tenderness, guarding or rebound.     Comments: LLQ insulin pump present.   Musculoskeletal:     Cervical back: Neck supple.  Skin:    General: Skin is warm and dry.     Findings: No  rash.  Neurological:     Mental Status: He is alert.     Comments: Clear speech.   Psychiatric:        Mood and Affect: Mood and affect normal.        Behavior: Behavior normal.    ED Results / Procedures / Treatments   Labs (all labs ordered are listed, but only abnormal results are displayed) Labs Reviewed  COMPREHENSIVE METABOLIC PANEL - Abnormal; Notable for the following components:      Result Value   Glucose, Bld 179 (*)    Creatinine, Ser 1.27 (*)    Calcium 8.7 (*)  All other components within normal limits  CBC - Abnormal; Notable for the following components:   Platelets 141 (*)    All other components within normal limits  URINALYSIS, ROUTINE W REFLEX MICROSCOPIC - Abnormal; Notable for the following components:   APPearance HAZY (*)    Specific Gravity, Urine 1.036 (*)    Ketones, ur 5 (*)    Protein, ur 100 (*)    Bacteria, UA RARE (*)    All other components within normal limits  CBG MONITORING, ED - Abnormal; Notable for the following components:   Glucose-Capillary 163 (*)    All other components within normal limits  RESP PANEL BY RT-PCR (FLU A&B, COVID) ARPGX2  LIPASE, BLOOD  MAGNESIUM    EKG None  Radiology No results found.  Procedures Procedures (including critical care time)  Medications Ordered in ED Medications - No data to display  ED Course  I have reviewed the triage vital signs and the nursing notes.  Pertinent labs & imaging results that were available during my care of the patient were reviewed by me and considered in my medical decision making (see chart for details).    MDM Rules/Calculators/A&P                          Patient presents to the ED with complaints of N/V/D x 3 days.  Nontoxic, vitals w/ mild tachycardia initially.  Exam is relatively benign. Lungs clear. Abdomen nontender w/o peritoneal signs.   Additional history obtained:  Additional history obtained from chart review & nursing note review.  Lab  Tests:  I Ordered, reviewed, and interpreted labs, which included:  CBC: Mild thrombocytopenia similar to prior lab ranges.  CMP: mild increase in creatinine compared to prior however has had similar in this range. Mild hyperglycemia @ 179- no acidosis or anion gap elevation.  Lipase: WNL Magnesium: WNL COVID/Influenza: Negative.  UA: ketonuria & proteinuria w/ elevated specific gravity, rare bacteria- not grossly infected.   Imaging Studies ordered:  I ordered imaging studies which included CXR, I independently visualized and interpreted imaging which showed no acute process.   I ordered fluids & zofran for symptomatic management.   15:50: RE-EVAL: Patient feeling better, will trial PO.   Repeat abdominal exam remains without focal tenderness or peritoneal signs- do not suspect acute surgical process. UA- no UTI, does appear dehydrated- received fluids in the ED parenterally and is now tolerating PO. Does not appear in DKA. Mild ketonuria, no glucosuria. He reported some dysuria yesterday and was started on cipro by PCP for UTI, UA without significant findings of infection today and is no longer having urinary sxs upon my questioning of the patient, no sxs to raise concern for orchitis/epidymitis/prostatitis either. No blood or mucous reported in diarrhea or recent foreign travel, abx, or hospitalizations per patient. Overall suspect viral at this time. Supportive tx. I discussed results, treatment plan, need for follow-up, and return precautions with the patient. Provided opportunity for questions, patient confirmed understanding and is in agreement with plan.   Portions of this note were generated with [employer]. Dictation errors may occur despite best attempts at proofreading.  Multiple tachypneic RR rates documented from automated monitor per RN, on each of my assessments of the patient he has had a RR < 20, I do not feel that these are accurate, he has not appeared tachyneic  or in respiratory distress throughout ED visit when physically assessed at the bedside. He appears  appropriate for discharge home.   Final Clinical Impression(s) / ED Diagnoses Final diagnoses:  Vomiting and diarrhea  Dehydration    Rx / DC Orders ED Discharge Orders         Ordered    ondansetron (ZOFRAN ODT) 4 MG disintegrating tablet  Every 8 hours PRN        04/10/20 [address] [place], [place] 04/10/20 1645    Davonna Belling, MD 04/11/20 [phone]

## 2020-04-10 NOTE — ED Notes (Signed)
Pt given ginger ale to drink. 

## 2020-04-10 NOTE — ED Triage Notes (Signed)
Pt c/o N/V/D for the past two days. Dr. Hilma Favors sent the pt to the ED for evaluation.

## 2020-04-10 NOTE — Discharge Instructions (Signed)
You were seen in the ER today for vomiting & diarrhea.   Your lab work looked similar to prior labs you have had done with somewhat high blood sugar, low platelets & elevated kidney function (creatinine- mildly increased from prior). Your urine had protein & ketones in it with signs of dehydration.   You were given fluids in the ER to help with dehydration.   We are sending you home with zofran to take every 8 hours as needed for vomiting and diet recommendations to hep with diarrhea.   Please follow-up with your primary care provider within 48 hours for reevaluation of your symptoms.  Please take the ciprofloxacin as directed by your primary care provider- you may call to discuss this with them.  Return to the ER for new or worsening symptoms including but not limited to inability to keep fluids down, abdominal pain, fever, blood in vomit or stool, back pain, trouble breathing, chest pain, passing out, or any other concerns.  Results for orders placed or performed during the hospital encounter of 04/10/20  Resp Panel by RT-PCR (Flu A&B, Covid) Nasopharyngeal Swab   Specimen: Nasopharyngeal Swab; Nasopharyngeal(NP) swabs in vial transport medium  Result Value Ref Range   SARS Coronavirus 2 by RT PCR NEGATIVE NEGATIVE   Influenza A by PCR NEGATIVE NEGATIVE   Influenza B by PCR NEGATIVE NEGATIVE  Lipase, blood  Result Value Ref Range   Lipase 24 11 - 51 U/L  Comprehensive metabolic panel  Result Value Ref Range   Sodium 136 135 - 145 mmol/L   Potassium 3.8 3.5 - 5.1 mmol/L   Chloride 103 98 - 111 mmol/L   CO2 23 22 - 32 mmol/L   Glucose, Bld 179 (H) 70 - 99 mg/dL   BUN 16 6 - 20 mg/dL   Creatinine, Ser 1.27 (H) 0.61 - 1.24 mg/dL   Calcium 8.7 (L) 8.9 - 10.3 mg/dL   Total Protein 7.0 6.5 - 8.1 g/dL   Albumin 4.0 3.5 - 5.0 g/dL   AST 27 15 - 41 U/L   ALT 35 0 - 44 U/L   Alkaline Phosphatase 61 38 - 126 U/L   Total Bilirubin 0.8 0.3 - 1.2 mg/dL   GFR, Estimated >60 >60 mL/min   Anion  gap 10 5 - 15  CBC  Result Value Ref Range   WBC 7.3 4.0 - 10.5 K/uL   RBC 5.22 4.22 - 5.81 MIL/uL   Hemoglobin 15.8 13.0 - 17.0 g/dL   HCT 47.1 39.0 - 52.0 %   MCV 90.2 80.0 - 100.0 fL   MCH 30.3 26.0 - 34.0 pg   MCHC 33.5 30.0 - 36.0 g/dL   RDW 11.9 11.5 - 15.5 %   Platelets 141 (L) 150 - 400 K/uL   nRBC 0.0 0.0 - 0.2 %  Urinalysis, Routine w reflex microscopic Urine, Clean Catch  Result Value Ref Range   Color, Urine YELLOW YELLOW   APPearance HAZY (A) CLEAR   Specific Gravity, Urine 1.036 (H) 1.005 - 1.030   pH 5.0 5.0 - 8.0   Glucose, UA NEGATIVE NEGATIVE mg/dL   Hgb urine dipstick NEGATIVE NEGATIVE   Bilirubin Urine NEGATIVE NEGATIVE   Ketones, ur 5 (A) NEGATIVE mg/dL   Protein, ur 100 (A) NEGATIVE mg/dL   Nitrite NEGATIVE NEGATIVE   Leukocytes,Ua NEGATIVE NEGATIVE   RBC / HPF 0-5 0 - 5 RBC/hpf   WBC, UA 0-5 0 - 5 WBC/hpf   Bacteria, UA RARE (A) NONE SEEN  Squamous Epithelial / LPF 0-5 0 - 5   Mucus PRESENT    Ca Oxalate Crys, UA PRESENT   Magnesium  Result Value Ref Range   Magnesium 1.7 1.7 - 2.4 mg/dL  CBG monitoring, ED  Result Value Ref Range   Glucose-Capillary 163 (H) 70 - 99 mg/dL   DG Chest Portable 1 View  Result Date: 04/10/2020 CLINICAL DATA:  Cough. EXAM: PORTABLE CHEST 1 VIEW COMPARISON:  CT chest and chest x-ray dated March 16, 2017. FINDINGS: The heart size and mediastinal contours are within normal limits. Both lungs are clear. The visualized skeletal structures are unremarkable. IMPRESSION: No active disease. Electronically Signed   By: Titus Dubin M.D.   On: 04/10/2020 14:32

## 2020-04-12 ENCOUNTER — Emergency Department (HOSPITAL_COMMUNITY)
Admission: EM | Admit: 2020-04-12 | Discharge: 2020-04-13 | Disposition: A | Discharge status: Home / Self Care | Payer: 59 | Source: Home / Self Care | Attending: Emergency Medicine | Admitting: Emergency Medicine

## 2020-04-12 ENCOUNTER — Emergency Department (HOSPITAL_COMMUNITY): Payer: 59

## 2020-04-12 ENCOUNTER — Encounter (HOSPITAL_COMMUNITY): Payer: Self-pay | Admitting: Emergency Medicine

## 2020-04-12 DIAGNOSIS — E1110 Type 2 diabetes mellitus with ketoacidosis without coma: Secondary | ICD-10-CM | POA: Diagnosis not present

## 2020-04-12 DIAGNOSIS — N182 Chronic kidney disease, stage 2 (mild): Secondary | ICD-10-CM | POA: Insufficient documentation

## 2020-04-12 DIAGNOSIS — K529 Noninfective gastroenteritis and colitis, unspecified: Secondary | ICD-10-CM

## 2020-04-12 DIAGNOSIS — I129 Hypertensive chronic kidney disease with stage 1 through stage 4 chronic kidney disease, or unspecified chronic kidney disease: Secondary | ICD-10-CM | POA: Insufficient documentation

## 2020-04-12 DIAGNOSIS — E1122 Type 2 diabetes mellitus with diabetic chronic kidney disease: Secondary | ICD-10-CM | POA: Insufficient documentation

## 2020-04-12 DIAGNOSIS — R112 Nausea with vomiting, unspecified: Secondary | ICD-10-CM | POA: Diagnosis not present

## 2020-04-12 DIAGNOSIS — F17210 Nicotine dependence, cigarettes, uncomplicated: Secondary | ICD-10-CM | POA: Insufficient documentation

## 2020-04-12 DIAGNOSIS — R0602 Shortness of breath: Secondary | ICD-10-CM | POA: Insufficient documentation

## 2020-04-12 DIAGNOSIS — R300 Dysuria: Secondary | ICD-10-CM | POA: Insufficient documentation

## 2020-04-12 DIAGNOSIS — Z451 Encounter for adjustment and management of infusion pump: Secondary | ICD-10-CM | POA: Insufficient documentation

## 2020-04-12 DIAGNOSIS — Z794 Long term (current) use of insulin: Secondary | ICD-10-CM | POA: Insufficient documentation

## 2020-04-12 DIAGNOSIS — Z79899 Other long term (current) drug therapy: Secondary | ICD-10-CM | POA: Insufficient documentation

## 2020-04-12 DIAGNOSIS — K219 Gastro-esophageal reflux disease without esophagitis: Secondary | ICD-10-CM | POA: Insufficient documentation

## 2020-04-12 LAB — CBC WITH DIFFERENTIAL/PLATELET
Abs Immature Granulocytes: 0.01 10*3/uL (ref 0.00–0.07)
Basophils Absolute: 0 10*3/uL (ref 0.0–0.1)
Basophils Relative: 1 %
Eosinophils Absolute: 0 10*3/uL (ref 0.0–0.5)
Eosinophils Relative: 1 %
HCT: 43.6 % (ref 39.0–52.0)
Hemoglobin: 15.1 g/dL (ref 13.0–17.0)
Immature Granulocytes: 0 %
Lymphocytes Relative: 28 %
Lymphs Abs: 1.8 10*3/uL (ref 0.7–4.0)
MCH: 30.6 pg (ref 26.0–34.0)
MCHC: 34.6 g/dL (ref 30.0–36.0)
MCV: 88.3 fL (ref 80.0–100.0)
Monocytes Absolute: 0.9 10*3/uL (ref 0.1–1.0)
Monocytes Relative: 14 %
Neutro Abs: 3.6 10*3/uL (ref 1.7–7.7)
Neutrophils Relative %: 56 %
Platelets: 119 10*3/uL — ABNORMAL LOW (ref 150–400)
RBC: 4.94 MIL/uL (ref 4.22–5.81)
RDW: 11.9 % (ref 11.5–15.5)
WBC: 6.3 10*3/uL (ref 4.0–10.5)
nRBC: 0 % (ref 0.0–0.2)

## 2020-04-12 LAB — COMPREHENSIVE METABOLIC PANEL
ALT: 27 U/L (ref 0–44)
AST: 29 U/L (ref 15–41)
Albumin: 3.4 g/dL — ABNORMAL LOW (ref 3.5–5.0)
Alkaline Phosphatase: 52 U/L (ref 38–126)
Anion gap: 13 (ref 5–15)
BUN: 17 mg/dL (ref 6–20)
CO2: 22 mmol/L (ref 22–32)
Calcium: 8.6 mg/dL — ABNORMAL LOW (ref 8.9–10.3)
Chloride: 99 mmol/L (ref 98–111)
Creatinine, Ser: 1.53 mg/dL — ABNORMAL HIGH (ref 0.61–1.24)
GFR, Estimated: 55 mL/min — ABNORMAL LOW (ref >60)
Glucose, Bld: 141 mg/dL — ABNORMAL HIGH (ref 70–99)
Potassium: 3.8 mmol/L (ref 3.5–5.1)
Sodium: 134 mmol/L — ABNORMAL LOW (ref 135–145)
Total Bilirubin: 1 mg/dL (ref 0.3–1.2)
Total Protein: 6.6 g/dL (ref 6.5–8.1)

## 2020-04-12 NOTE — ED Triage Notes (Signed)
Pt reports sob n/v/d x1 week, had negative covid test yesterday at [facility]. States that dr Hilma Favors saw him and advised his O2 sat and BP were low and he needed further eval at ED. Pt seen on 12/14 at [facility] for same. O2 sats 92% on room air.

## 2020-04-12 NOTE — ED Provider Notes (Signed)
[facility] DEPARTMENT Provider Note  CSN: [id number] Arrival date & time: 04/12/20 1301    History Chief Complaint  Patient presents with   Shortness of Breath    HPI  [name] is a 49 y.o. male with history of DM and CKD has had intermittent episodes of vomiting and diarrhea for the last week. He was seen at PCP office 2 days ago when he was also having some dysuria; started on Cipro. He had ketones in his urine at PCP and sent to the ED where his labs were normal. He was given IVF and ultimately discharged. He went back to PCP office today for re-check and was noted to be hypotensive and hypoxic. He was told to go to the ED and that '[facility] would have a bed waiting for him'. He has not vomited during a prolonged ED wait time but he did have one episode of diarrhea. He states his PCP told him to come to the ED for 'fluids and antibiotics' but it isn't clear what he would need either of those things for. He is still taking Cipro. Has not been significantly hypotensive or hypoxic while in the ED today. He denies any CP or SOB. No fevers. No abdominal pain  Past Medical History:  Diagnosis Date   CKD (chronic kidney disease), stage II    Deafness    Left ear only. Congenital.   GERD (gastroesophageal reflux disease)    History of influenza    Hyperlipidemia    Hypertension    Sleep apnea    Thrombocytopenia (HCC)    Type 2 diabetes mellitus ([place])    Vitamin B12 deficiency     Past Surgical History:  Procedure Laterality Date   COLONOSCOPY WITH PROPOFOL N/A 01/04/2020   Procedure: COLONOSCOPY WITH PROPOFOL;  Surgeon: Rogene Houston, MD;  Location: [facility];  Service: Endoscopy;  Laterality: N/A;  [place]     Insulin   NASAL SEPTUM SURGERY     POLYPECTOMY  01/04/2020   Procedure: POLYPECTOMY;  Surgeon: Rogene Houston, MD;  Location: [facility];  Service: Endoscopy;;    Family History   Problem Relation Age of Onset   Hypertension Mother    Hypertension Father    Heart disease Father     Social History   Tobacco Use   Smoking status: Current Every Day Smoker    Packs/day: 1.50    Years: 27.00    Pack years: 40.50    Types: Cigarettes   Smokeless tobacco: Never Used  Scientific laboratory technician Use: Never used  Substance Use Topics   Alcohol use: Yes    Comment: Occasional   Drug use: No     Home Medications Prior to Admission medications   Medication Sig Start Date End Date Taking? Authorizing Provider  ciprofloxacin (CIPRO) 500 MG tablet Take 500 mg by mouth 2 (two) times daily. 04/09/20   [provider]  diphenoxylate-atropine (LOMOTIL) 2.5-0.025 MG tablet Take 1 tablet by mouth every 4 (four) hours as needed. 04/09/20   [provider]  losartan (COZAAR) 100 MG tablet Take 100 mg daily by mouth. 03/13/17   [provider]  Multiple Vitamins-Minerals (CENTRUM PO) Take 1 tablet by mouth daily.    [provider]  NOVOLOG 100 UNIT/ML injection Inject 30 Units into the skin daily. In insulin pump 07/08/17   [provider]  ondansetron (ZOFRAN ODT)  4 MG disintegrating tablet Take 1 tablet (4 mg total) by mouth every 8 (eight) hours as needed for nausea or vomiting. 04/10/20   Petrucelli, Samantha R, PA-C  pantoprazole (PROTONIX) 40 MG tablet Take 40 mg daily by mouth. 02/03/17   [provider]  simvastatin (ZOCOR) 40 MG tablet Take 40 mg by mouth daily.    [provider]     Allergies    Codeine and Glucophage [metformin hydrochloride]   Review of Systems   Review of Systems A comprehensive review of systems was completed and negative except as noted in HPI.    Physical Exam BP 109/72    Pulse 87    Temp 98.5 F (36.9 C) (Oral)    Resp 17    Ht 5\' 2"  (1.575 m)    Wt 83 kg    SpO2 98%    BMI 33.47 kg/m   Physical Exam Vitals and nursing note reviewed.  Constitutional:       Appearance: Normal appearance.  HENT:     Head: Normocephalic and atraumatic.     Nose: Nose normal.     Mouth/Throat:     Mouth: Mucous membranes are moist.  Eyes:     Extraocular Movements: Extraocular movements intact.     Conjunctiva/sclera: Conjunctivae normal.  Cardiovascular:     Rate and Rhythm: Normal rate.  Pulmonary:     Effort: Pulmonary effort is normal.     Breath sounds: Normal breath sounds.  Abdominal:     General: Abdomen is flat.     Palpations: Abdomen is soft.     Tenderness: There is no abdominal tenderness.  Musculoskeletal:        General: No swelling. Normal range of motion.     Cervical back: Neck supple.  Skin:    General: Skin is warm and dry.  Neurological:     General: No focal deficit present.     Mental Status: He is alert.  Psychiatric:        Mood and Affect: Mood normal.      ED Results / Procedures / Treatments   Labs (all labs ordered are listed, but only abnormal results are displayed) Labs Reviewed  COMPREHENSIVE METABOLIC PANEL - Abnormal; Notable for the following components:      Result Value   Sodium 134 (*)    Glucose, Bld 141 (*)    Creatinine, Ser 1.53 (*)    Calcium 8.6 (*)    Albumin 3.4 (*)    GFR, Estimated 55 (*)    All other components within normal limits  CBC WITH DIFFERENTIAL/PLATELET - Abnormal; Notable for the following components:   Platelets 119 (*)    All other components within normal limits  LACTIC ACID, PLASMA  LACTIC ACID, PLASMA    EKG EKG Interpretation  Date/Time:  Thursday April 12 2020 13:02:48 EST Ventricular Rate:  106 PR Interval:  126 QRS Duration: 76 QT Interval:  318 QTC Calculation: 422 R Axis:   83 Text Interpretation: Sinus tachycardia Otherwise normal ECG No significant change since last tracing Confirmed by Calvert Cantor [phone]) on 04/12/2020 11:52:01 PM   Radiology DG Chest 2 View  Result Date: 04/12/2020 CLINICAL DATA:  Short of breath EXAM: CHEST - 2 VIEW  COMPARISON:  04/10/2020 FINDINGS: The heart size and mediastinal contours are within normal limits. Both lungs are clear. The visualized skeletal structures are unremarkable. IMPRESSION: No active cardiopulmonary disease. Electronically Signed   By: Franchot Gallo M.D.   On: 04/12/2020  13:33    Procedures Procedures  Medications Ordered in the ED Medications - No data to display   MDM Rules/Calculators/A&P MDM Patient sent to the ED by PCP but unclear what their intention was. His CBC and CMP today are unchanged from previous ED visit. No leukocytosis, AKI or signs of DKA. His exam is benign, he is tolerating PO fluids and he is already on Cipro. He does not need any further ED intervention or workup. He was advised to take his antiemetics as needed.  ED Course  I have reviewed the triage vital signs and the nursing notes.  Pertinent labs & imaging results that were available during my care of the patient were reviewed by me and considered in my medical decision making (see chart for details).     Final Clinical Impression(s) / ED Diagnoses Final diagnoses:  Gastroenteritis    Rx / DC Orders ED Discharge Orders    None       Truddie Hidden, MD 04/13/20 0002

## 2020-04-15 ENCOUNTER — Encounter (HOSPITAL_COMMUNITY): Payer: Self-pay | Admitting: Emergency Medicine

## 2020-04-15 ENCOUNTER — Emergency Department (HOSPITAL_COMMUNITY): Payer: 59

## 2020-04-15 ENCOUNTER — Inpatient Hospital Stay (HOSPITAL_COMMUNITY)
Admission: EM | Admit: 2020-04-15 | Discharge: 2020-04-18 | DRG: 638 | Disposition: A | Discharge status: Home / Self Care | Payer: 59 | Attending: Internal Medicine | Admitting: Internal Medicine

## 2020-04-15 DIAGNOSIS — R9431 Abnormal electrocardiogram [ECG] [EKG]: Secondary | ICD-10-CM | POA: Diagnosis not present

## 2020-04-15 DIAGNOSIS — E782 Mixed hyperlipidemia: Secondary | ICD-10-CM | POA: Diagnosis not present

## 2020-04-15 DIAGNOSIS — J40 Bronchitis, not specified as acute or chronic: Secondary | ICD-10-CM

## 2020-04-15 DIAGNOSIS — H9192 Unspecified hearing loss, left ear: Secondary | ICD-10-CM | POA: Diagnosis present

## 2020-04-15 DIAGNOSIS — K219 Gastro-esophageal reflux disease without esophagitis: Secondary | ICD-10-CM | POA: Diagnosis present

## 2020-04-15 DIAGNOSIS — I129 Hypertensive chronic kidney disease with stage 1 through stage 4 chronic kidney disease, or unspecified chronic kidney disease: Secondary | ICD-10-CM | POA: Diagnosis present

## 2020-04-15 DIAGNOSIS — J209 Acute bronchitis, unspecified: Secondary | ICD-10-CM | POA: Diagnosis present

## 2020-04-15 DIAGNOSIS — E1110 Type 2 diabetes mellitus with ketoacidosis without coma: Principal | ICD-10-CM | POA: Diagnosis present

## 2020-04-15 DIAGNOSIS — E538 Deficiency of other specified B group vitamins: Secondary | ICD-10-CM | POA: Diagnosis present

## 2020-04-15 DIAGNOSIS — Z888 Allergy status to other drugs, medicaments and biological substances status: Secondary | ICD-10-CM | POA: Diagnosis not present

## 2020-04-15 DIAGNOSIS — E1122 Type 2 diabetes mellitus with diabetic chronic kidney disease: Secondary | ICD-10-CM | POA: Diagnosis present

## 2020-04-15 DIAGNOSIS — F17210 Nicotine dependence, cigarettes, uncomplicated: Secondary | ICD-10-CM | POA: Diagnosis present

## 2020-04-15 DIAGNOSIS — Z9641 Presence of insulin pump (external) (internal): Secondary | ICD-10-CM | POA: Diagnosis present

## 2020-04-15 DIAGNOSIS — Z720 Tobacco use: Secondary | ICD-10-CM | POA: Diagnosis not present

## 2020-04-15 DIAGNOSIS — Z885 Allergy status to narcotic agent status: Secondary | ICD-10-CM | POA: Diagnosis not present

## 2020-04-15 DIAGNOSIS — Z794 Long term (current) use of insulin: Secondary | ICD-10-CM | POA: Diagnosis not present

## 2020-04-15 DIAGNOSIS — R112 Nausea with vomiting, unspecified: Secondary | ICD-10-CM | POA: Diagnosis present

## 2020-04-15 DIAGNOSIS — E11649 Type 2 diabetes mellitus with hypoglycemia without coma: Secondary | ICD-10-CM | POA: Diagnosis not present

## 2020-04-15 DIAGNOSIS — Z6834 Body mass index (BMI) 34.0-34.9, adult: Secondary | ICD-10-CM | POA: Diagnosis not present

## 2020-04-15 DIAGNOSIS — E785 Hyperlipidemia, unspecified: Secondary | ICD-10-CM

## 2020-04-15 DIAGNOSIS — G4730 Sleep apnea, unspecified: Secondary | ICD-10-CM | POA: Diagnosis present

## 2020-04-15 DIAGNOSIS — E669 Obesity, unspecified: Secondary | ICD-10-CM | POA: Diagnosis present

## 2020-04-15 DIAGNOSIS — N179 Acute kidney failure, unspecified: Secondary | ICD-10-CM | POA: Diagnosis present

## 2020-04-15 DIAGNOSIS — E119 Type 2 diabetes mellitus without complications: Secondary | ICD-10-CM

## 2020-04-15 DIAGNOSIS — E860 Dehydration: Secondary | ICD-10-CM | POA: Diagnosis present

## 2020-04-15 DIAGNOSIS — N182 Chronic kidney disease, stage 2 (mild): Secondary | ICD-10-CM | POA: Diagnosis present

## 2020-04-15 DIAGNOSIS — R197 Diarrhea, unspecified: Secondary | ICD-10-CM | POA: Diagnosis present

## 2020-04-15 DIAGNOSIS — Z87440 Personal history of urinary (tract) infections: Secondary | ICD-10-CM

## 2020-04-15 DIAGNOSIS — Z8619 Personal history of other infectious and parasitic diseases: Secondary | ICD-10-CM | POA: Diagnosis not present

## 2020-04-15 DIAGNOSIS — Z20822 Contact with and (suspected) exposure to covid-19: Secondary | ICD-10-CM | POA: Diagnosis present

## 2020-04-15 DIAGNOSIS — Z79899 Other long term (current) drug therapy: Secondary | ICD-10-CM

## 2020-04-15 DIAGNOSIS — Z8249 Family history of ischemic heart disease and other diseases of the circulatory system: Secondary | ICD-10-CM

## 2020-04-15 LAB — COMPREHENSIVE METABOLIC PANEL
ALT: 20 U/L (ref 0–44)
AST: 19 U/L (ref 15–41)
Albumin: 3.8 g/dL (ref 3.5–5.0)
Alkaline Phosphatase: 50 U/L (ref 38–126)
Anion gap: 10 (ref 5–15)
BUN: 23 mg/dL — ABNORMAL HIGH (ref 6–20)
CO2: 25 mmol/L (ref 22–32)
Calcium: 9 mg/dL (ref 8.9–10.3)
Chloride: 100 mmol/L (ref 98–111)
Creatinine, Ser: 1.17 mg/dL (ref 0.61–1.24)
GFR, Estimated: >60 mL/min (ref >60)
Glucose, Bld: 167 mg/dL — ABNORMAL HIGH (ref 70–99)
Potassium: 3.6 mmol/L (ref 3.5–5.1)
Sodium: 135 mmol/L (ref 135–145)
Total Bilirubin: 1.1 mg/dL (ref 0.3–1.2)
Total Protein: 7.2 g/dL (ref 6.5–8.1)

## 2020-04-15 LAB — URINALYSIS, ROUTINE W REFLEX MICROSCOPIC
Bacteria, UA: NONE SEEN
Bilirubin Urine: NEGATIVE
Glucose, UA: NEGATIVE mg/dL
Ketones, ur: 80 mg/dL — AB
Leukocytes,Ua: NEGATIVE
Nitrite: NEGATIVE
Protein, ur: 100 mg/dL — AB
Specific Gravity, Urine: 1.025 (ref 1.005–1.030)
pH: 6 (ref 5.0–8.0)

## 2020-04-15 LAB — CBC WITH DIFFERENTIAL/PLATELET
Abs Immature Granulocytes: 0.04 10*3/uL (ref 0.00–0.07)
Basophils Absolute: 0 10*3/uL (ref 0.0–0.1)
Basophils Relative: 0 %
Eosinophils Absolute: 0 10*3/uL (ref 0.0–0.5)
Eosinophils Relative: 0 %
HCT: 43.9 % (ref 39.0–52.0)
Hemoglobin: 15 g/dL (ref 13.0–17.0)
Immature Granulocytes: 0 %
Lymphocytes Relative: 9 %
Lymphs Abs: 1 10*3/uL (ref 0.7–4.0)
MCH: 30.2 pg (ref 26.0–34.0)
MCHC: 34.2 g/dL (ref 30.0–36.0)
MCV: 88.5 fL (ref 80.0–100.0)
Monocytes Absolute: 0.6 10*3/uL (ref 0.1–1.0)
Monocytes Relative: 6 %
Neutro Abs: 9.5 10*3/uL — ABNORMAL HIGH (ref 1.7–7.7)
Neutrophils Relative %: 85 %
Platelets: 161 10*3/uL (ref 150–400)
RBC: 4.96 MIL/uL (ref 4.22–5.81)
RDW: 11.7 % (ref 11.5–15.5)
WBC: 11.3 10*3/uL — ABNORMAL HIGH (ref 4.0–10.5)
nRBC: 0 % (ref 0.0–0.2)

## 2020-04-15 LAB — LIPASE, BLOOD: Lipase: 22 U/L (ref 11–51)

## 2020-04-15 LAB — D-DIMER, QUANTITATIVE: D-Dimer, Quant: 1.45 ug{FEU}/mL — ABNORMAL HIGH (ref 0.00–0.50)

## 2020-04-15 LAB — RESP PANEL BY RT-PCR (FLU A&B, COVID) ARPGX2
Influenza A by PCR: NEGATIVE
Influenza B by PCR: NEGATIVE
SARS Coronavirus 2 by RT PCR: NEGATIVE

## 2020-04-15 LAB — CBG MONITORING, ED: Glucose-Capillary: 169 mg/dL — ABNORMAL HIGH (ref 70–99)

## 2020-04-15 MED ORDER — ALBUTEROL (5 MG/ML) CONTINUOUS INHALATION SOLN
INHALATION_SOLUTION | RESPIRATORY_TRACT | Status: AC
  Administered 2020-04-15: 22:00:00 10 mg via RESPIRATORY_TRACT
  Filled 2020-04-15: qty 20

## 2020-04-15 MED ORDER — ALBUTEROL (5 MG/ML) CONTINUOUS INHALATION SOLN
10.0000 mg/h | INHALATION_SOLUTION | Freq: Once | RESPIRATORY_TRACT | Status: AC
  Administered 2020-04-15: 21:00:00 10 mg/h via RESPIRATORY_TRACT

## 2020-04-15 MED ORDER — METHYLPREDNISOLONE SODIUM SUCC 125 MG IJ SOLR
80.0000 mg | Freq: Once | INTRAMUSCULAR | Status: AC
  Administered 2020-04-15: 21:00:00 80 mg via INTRAVENOUS
  Filled 2020-04-15: qty 2

## 2020-04-15 MED ORDER — IPRATROPIUM-ALBUTEROL 0.5-2.5 (3) MG/3ML IN SOLN
3.0000 mL | Freq: Once | RESPIRATORY_TRACT | Status: AC
  Administered 2020-04-15: 18:00:00 3 mL via RESPIRATORY_TRACT
  Filled 2020-04-15: qty 3

## 2020-04-15 MED ORDER — ALBUTEROL SULFATE (2.5 MG/3ML) 0.083% IN NEBU
2.5000 mg | INHALATION_SOLUTION | Freq: Once | RESPIRATORY_TRACT | Status: AC
  Administered 2020-04-15: 18:00:00 2.5 mg via RESPIRATORY_TRACT
  Filled 2020-04-15: qty 3

## 2020-04-15 MED ORDER — AEROCHAMBER Z-STAT PLUS/MEDIUM MISC
1.0000 | Freq: Once | Status: AC
  Administered 2020-04-15: 17:00:00 1

## 2020-04-15 MED ORDER — HYDROCOD POLST-CPM POLST ER 10-8 MG/5ML PO SUER
5.0000 mL | Freq: Once | ORAL | Status: AC
  Administered 2020-04-15: 5 mL via ORAL
  Filled 2020-04-15: qty 5

## 2020-04-15 MED ORDER — SODIUM CHLORIDE 0.9 % IV BOLUS
1000.0000 mL | Freq: Once | INTRAVENOUS | Status: AC
  Administered 2020-04-15: 18:00:00 1000 mL via INTRAVENOUS

## 2020-04-15 MED ORDER — ALBUTEROL SULFATE HFA 108 (90 BASE) MCG/ACT IN AERS
3.0000 | INHALATION_SPRAY | RESPIRATORY_TRACT | Status: DC | PRN
  Administered 2020-04-15: 17:00:00 3 via RESPIRATORY_TRACT
  Filled 2020-04-15: qty 6.7

## 2020-04-15 MED ORDER — SODIUM CHLORIDE 0.9 % IV BOLUS
1000.0000 mL | Freq: Once | INTRAVENOUS | Status: AC
  Administered 2020-04-15: 17:00:00 1000 mL via INTRAVENOUS

## 2020-04-15 MED ORDER — PROMETHAZINE HCL 25 MG/ML IJ SOLN
12.5000 mg | Freq: Once | INTRAMUSCULAR | Status: AC
  Administered 2020-04-15: 17:00:00 12.5 mg via INTRAVENOUS
  Filled 2020-04-15: qty 1

## 2020-04-15 MED ORDER — IOHEXOL 350 MG/ML SOLN
100.0000 mL | Freq: Once | INTRAVENOUS | Status: AC | PRN
  Administered 2020-04-15: 23:00:00 100 mL via INTRAVENOUS

## 2020-04-15 MED ORDER — SODIUM CHLORIDE 0.9 % IV BOLUS
1000.0000 mL | Freq: Once | INTRAVENOUS | Status: AC
  Administered 2020-04-15: 1000 mL via INTRAVENOUS

## 2020-04-15 NOTE — H&P (Signed)
History and Physical  [name] XIP:[id number] DOB: Aug 24, 1970 DOA: 04/15/2020  Referring physician: Evalee Jefferson, PA-C PCP: Sharilyn Sites, MD  Patient coming from: Home  Chief Complaint: Nausea, vomiting, diarrhea  HPI: [name] is a 49 y.o. male with medical history significant for obesity, hyperlipidemia, type 2 diabetes mellitus on insulin pump and GERD who presents to the emergency department due to 9-day onset of nausea, vomiting and diarrhea.  Vomiting was approximately 5 episodes daily with vomiting of food contents with no blood.  Diarrhea was several episodes daily and was mainly watery in nature.  He was unable to keep anything down, he saw his PCP on 12/13 and was prescribed with ciprofloxacin due to presumed UTI.  Patient also complained of cough and increasing shortness of breath which started about the same time. He denies any abdominal pain, fever, chills, sick contacts.  Patient was seen on 12/14 in the ED and was noted to be dehydrated with symptoms suspected to be due to viral.  Patient returned [facility] ED on 12/16 due to shortness of breath and was diagnosed to have gastroenteritis and was discharged home with antiemetics as needed.  ED Course:  In the emergency department, he was tachycardic and tachypneic.  BP was 130/77 and O2 sat was 93-100% on room air.  Work-up in the ED showed mild leukocytosis, hyperglycemia.  D-dimer 1.45.  Urinalysis was unimpressive for UTI.  Respiratory panel for influenza A, B and SARS coronavirus was negative. CT angiography chest showed no evidence of PE but showed lower lobe bronchial wall thickening compatible with bronchitis with no acute airspace disease. Chest x-ray showed no acute intrathoracic process Breathing treatment was provided, IV Solu-Medrol was given.  Phenergan was given due to nausea and vomiting and IV hydration was provided.  Hospitalist was asked to admit patient for further evaluation and management.  Review of  Systems: Constitutional: Negative for chills and fever.  HENT: Negative for ear pain and sore throat.   Eyes: Negative for pain and visual disturbance.  Respiratory: Positive for cough,  wheezing and shortness of breath.   Cardiovascular: Negative for chest pain and palpitations.  Gastrointestinal: Positive for nausea, vomiting and abdominal pain  Endocrine: Negative for polyphagia and polyuria.  Genitourinary: Negative for decreased urine volume, dysuria, enuresis Musculoskeletal: Negative for arthralgias and back pain.  Skin: Negative for color change and rash.  Allergic/Immunologic: Negative for immunocompromised state.  Neurological: Negative for tremors, syncope, speech difficulty, weakness, light-headedness and headaches.  Hematological: Does not bruise/bleed easily.  All other systems reviewed and are negative  Past Medical History:  Diagnosis Date  . CKD (chronic kidney disease), stage II   . Deafness    Left ear only. Congenital.  . GERD (gastroesophageal reflux disease)   . History of influenza   . Hyperlipidemia   . Hypertension   . Sleep apnea   . Thrombocytopenia ([place])   . Type 2 diabetes mellitus ([place])   . Vitamin B12 deficiency    Past Surgical History:  Procedure Laterality Date  . COLONOSCOPY WITH PROPOFOL N/A 01/04/2020   Procedure: COLONOSCOPY WITH PROPOFOL;  Surgeon: Rogene Houston, MD;  [facility];  Service: Endoscopy;  Laterality: N/A;  730  . HEMORRHOID SURGERY    . INFUSION PUMP IMPLANTATION     Insulin  . NASAL SEPTUM SURGERY    . POLYPECTOMY  01/04/2020   Procedure: POLYPECTOMY;  Surgeon: Rogene Houston, MD;  Location: [facility];  Service: Endoscopy;;  Social History:  reports that he has been smoking cigarettes. He has a 40.50 pack-year smoking history. He has never used smokeless tobacco. He reports current alcohol use. He reports that he does not use drugs.   Allergies  Allergen Reactions  . Codeine Nausea Only     Drowsiness  . Glucophage [Metformin Hydrochloride] Other (See Comments)    Vomiting and cramps    Family History  Problem Relation Age of Onset  . Hypertension Mother   . Hypertension Father   . Heart disease Father     Prior to Admission medications   Medication Sig Start Date End Date Taking? Authorizing Provider  benzonatate (TESSALON) 200 MG capsule Take 200 mg by mouth 3 (three) times daily as needed for cough. 04/11/20  Yes [provider]  diphenoxylate-atropine (LOMOTIL) 2.5-0.025 MG tablet Take 1 tablet by mouth every 4 (four) hours as needed. 04/09/20  Yes [provider]  losartan (COZAAR) 100 MG tablet Take 100 mg daily by mouth. 03/13/17  Yes [provider]  Multiple Vitamins-Minerals (CENTRUM PO) Take 1 tablet by mouth daily.   Yes [provider]  NOVOLOG 100 UNIT/ML injection Inject 30 Units into the skin daily. In insulin pump 07/08/17  Yes [provider]  ondansetron (ZOFRAN ODT) 4 MG disintegrating tablet Take 1 tablet (4 mg total) by mouth every 8 (eight) hours as needed for nausea or vomiting. 04/10/20  Yes Petrucelli, Samantha R, PA-C  pantoprazole (PROTONIX) 40 MG tablet Take 40 mg daily by mouth. 02/03/17  Yes [provider]  simvastatin (ZOCOR) 40 MG tablet Take 40 mg by mouth daily.   Yes [provider]  ciprofloxacin (CIPRO) 500 MG tablet Take 500 mg by mouth 2 (two) times daily. Patient not taking: Reported on 04/15/2020 04/09/20   [provider]    Physical Exam: BP (!) 93/46   Pulse (!) 136   Temp 98.8 F (37.1 C) (Oral)   Resp 19   Ht 5\' 2"  (1.575 m)   Wt 83 kg   SpO2 96%   BMI 33.47 kg/m   . General: 49 y.o. year-old male well developed well nourished in no acute distress.  Alert and oriented x3. Marland Kitchen HEENT: Dry mucous membrane NCAT, EOMI . Neck: Supple, trachea medial . Cardiovascular: Tachycardia.  Regular rate and rhythm with no rubs or gallops.  No thyromegaly or JVD  noted. 2/4 pulses in all 4 extremities. Marland Kitchen Respiratory: Diffuse expiratory wheezing in all lobes.  No rales . Abdomen: Soft nontender, with normal bowel sounds x4 quadrants. . Muskuloskeletal: No cyanosis, clubbing or edema noted bilaterally . Neuro: CN II-XII intact, strength, sensation, reflexes . Skin: No ulcerative lesions noted or rashes . Psychiatry: Judgement and insight appear normal. Mood is appropriate for condition and setting          Labs on Admission:  Basic Metabolic Panel: Recent Labs  Lab 04/10/20 1321 04/12/20 1309 04/15/20 1629  NA 136 134* 135  K 3.8 3.8 3.6  CL 103 99 100  CO2 23 22 25   GLUCOSE 179* 141* 167*  BUN 16 17 23*  CREATININE 1.27* 1.53* 1.17  CALCIUM 8.7* 8.6* 9.0  MG 1.7  --   --    Liver Function Tests: Recent Labs  Lab 04/10/20 1321 04/12/20 1309 04/15/20 1629  AST 27 29 19   ALT 35 27 20  ALKPHOS 61 52 50  BILITOT 0.8 1.0 1.1  PROT 7.0 6.6 7.2  ALBUMIN 4.0 3.4* 3.8   Recent Labs  Lab 04/10/20 1321 04/15/20 1629  LIPASE 24 22   No results for input(s): AMMONIA in the last 168 hours. CBC: Recent Labs  Lab 04/10/20 1321 04/12/20 1309 04/15/20 1629  WBC 7.3 6.3 11.3*  NEUTROABS  --  3.6 9.5*  HGB 15.8 15.1 15.0  HCT 47.1 43.6 43.9  MCV 90.2 88.3 88.5  PLT 141* 119* 161   Cardiac Enzymes: No results for input(s): CKTOTAL, CKMB, CKMBINDEX, TROPONINI in the last 168 hours.  BNP (last 3 results) No results for input(s): BNP in the last 8760 hours.  ProBNP (last 3 results) No results for input(s): PROBNP in the last 8760 hours.  CBG: Recent Labs  Lab 04/10/20 1241 04/15/20 1527 04/16/20 0134  GLUCAP 163* 169* >600*    Radiological Exams on Admission: CT Angio Chest PE W and/or Wo Contrast  Result Date: 04/15/2020 CLINICAL DATA:  Dyspnea on exertion, tachycardia, nausea and vomiting, positive D dimer EXAM: CT ANGIOGRAPHY CHEST WITH CONTRAST TECHNIQUE: Multidetector CT imaging of the chest was performed using the  standard protocol during bolus administration of intravenous contrast. Multiplanar CT image reconstructions and MIPs were obtained to evaluate the vascular anatomy. CONTRAST:  130mL OMNIPAQUE IOHEXOL 350 MG/ML SOLN COMPARISON:  03/16/2017, 04/15/2020 FINDINGS: Cardiovascular: This is a technically adequate evaluation of the pulmonary vasculature. No filling defects or pulmonary emboli. The heart is unremarkable without pericardial effusion. Normal appearance of the thoracic aorta. Mediastinum/Nodes: Borderline enlarged mediastinal lymph nodes are stable and likely benign. No pathologic adenopathy. Thyroid, trachea, and esophagus are unremarkable. Lungs/Pleura: No airspace disease, effusion, or pneumothorax. There is mild bronchial wall thickening most pronounced in the bilateral lower lobes. Central airways are patent. Upper Abdomen: No acute abnormality. Musculoskeletal: No acute or destructive bony lesions. Reconstructed images demonstrate no additional findings. Review of the MIP images confirms the above findings. IMPRESSION: 1. No evidence of pulmonary embolus. 2. Lower lobe bronchial wall thickening compatible with bronchitis. No acute airspace disease. Electronically Signed   By: Randa Ngo M.D.   On: 04/15/2020 23:16   DG Chest Portable 1 View  Result Date: 04/15/2020 CLINICAL DATA:  Short of breath, cough, emesis EXAM: PORTABLE CHEST 1 VIEW COMPARISON:  04/12/2020 FINDINGS: Single frontal view of the chest demonstrates an unremarkable cardiac silhouette. Background emphysema without airspace disease, effusion, or pneumothorax. No acute bony abnormalities. IMPRESSION: 1. No acute intrathoracic process. Electronically Signed   By: Randa Ngo M.D.   On: 04/15/2020 16:57    EKG: I independently viewed the EKG done and my findings are as followed: Sinus tachycardia at rate of 113 bpm with QTc 512 ms  Assessment/Plan Present on Admission: . Tobacco abuse . Acute bronchitis . Dehydration .  Nausea, vomiting and diarrhea  Principal Problem:   Nausea, vomiting and diarrhea Active Problems:   Tobacco abuse   Acute bronchitis   DM type 2 (diabetes mellitus, type 2) (HCC)   Dehydration   Hyperlipidemia   Prolonged QT interval   Nausea, vomiting and diarrhea Continue IV Compazine 10 mg every 6 hours as needed Continue IV hydration Patient has not had any BM since arrival to the ED C. difficile and GI panel pending  Dehydration Continue IV hydration  Anion gap metabolic acidosis secondary to DKA Hyperglycemia secondary to poorly controlled type 2 diabetes mellitus Patient went into DKA, apparently he was not using his insulin pump since arrival to the ED  IV Solu-Medrol 125 Mg x1 was given in the ED Anion gap = 23 Beta hydroxybutyrate 5.18, VBG pending Continue  insulin drip, IV LR with IV potassium per DKA protocol Transition IV LR to D5LR  when serum glucose reaches 250mg /dL Continue serial BMP and VBG Continue to monitor for anion gap closure prior to transitioning patient to subcu insulin Continue NPO  Shortness of breath possibly secondary to acute bronchitis Patient has a history of tobacco abuse, though he does not have a diagnosis of COPD Continue Xopenex, Mucinex. Solu-Medrol temporarily held due to patient being in DKA Continue Protonix to prevent steroid-induced ulcer Continue incentive spirometry and flutter valve Patient is currently not requiring supplemental oxygen  Hyperlipidemia Continue home Zocor  Hyperglycemia secondary to type 2 diabetes mellitus Continue home insulin pump  Prolonged QTc Avoid QT prolonging drugs Magnesium level will be checked  Tobacco abuse Patient was counseled on tobacco abuse cessation Patient refused nicotine patch  DVT prophylaxis: Lovenox  Code Status: Full code  Family Communication: Wife at bedside (all questions answered to satisfaction)  Disposition Plan:  Patient is from:                         home Anticipated DC to:                   SNF or family members home Anticipated DC date:               2-3 days Anticipated DC barriers:           Patient is unstable to be discharged at this time due to nausea, vomiting, diarrhea as well as shortness of breath with wheezing that requires inpatient management  Consults called: None  Admission status: Inpatient  Bernadette Hoit MD [facility]  04/16/2020, 1:45 AM

## 2020-04-15 NOTE — ED Notes (Signed)
Pt has tolerated fluid challenge

## 2020-04-15 NOTE — ED Notes (Signed)
Pt has been incontinent of stool once and has had a moderate brownish soft/liquid stool.

## 2020-04-15 NOTE — ED Notes (Signed)
Pt walked up and down hall- denies shortness of breath- pt HR between 125-130 Pt O2 sats 93-96% on RAAlmyra Free, PA made aware

## 2020-04-15 NOTE — ED Notes (Signed)
Pt given gingerale for fluid challenge. 

## 2020-04-15 NOTE — ED Triage Notes (Signed)
Pt to the ED with nausea, vomiting, and diarrhea for over a week.  Pt tested negative for Covid here on 04/11/20 Pt was seen at [facility] for the same on 12/16 without relief.

## 2020-04-15 NOTE — ED Provider Notes (Signed)
[facility] EMERGENCY DEPARTMENT Provider Note   CSN: [id number] Arrival date & time: 04/15/20  1514     History Chief Complaint  Patient presents with  . Emesis    [name] is a 49 y.o. male with history of chronic kidney disease stage II, GERD, type 2 diabetes, hypertension presenting with now a 10 day history of nausea vomiting and diarrhea.  He denies abdominal pain.  He additionally reports shortness of breath with significant wheezing and nonproductive cough.  Initially he had seen his primary doctor for these symptoms at which time he was diagnosed with a UTI and placed on Cipro which he has completed, he was having mild dysuria which has resolved however the nausea vomiting and diarrhea have persisted.  He describes 5-6 episodes of vomiting over the past 24 hours and has been unable to tolerate any p.o. intake.  Additionally he has had a multiple episodes of nonbloody diarrhea.  He denies fevers or chills, he denies chest pain but endorses chest and abdominal wall soreness from the vomiting.  He denies orthopnea, denies peripheral edema.  He has been prescribed Tessalon for cough and Zofran for vomiting, neither medications have been very helpful.  He denies history of asthma or COPD but is a smoker.  He also endorses childhood asthma.  He has significant shortness of breath with ambulation, states he was hypoxic when he was in his physician's office several days ago.  This is his third visit to the ED with this illness and feels persistently worse.  He denies pleuritic pain, denies leg pain or swelling but has noted increased cramping in his calf muscles.  HPI     Past Medical History:  Diagnosis Date  . CKD (chronic kidney disease), stage II   . Deafness    Left ear only. Congenital.  . GERD (gastroesophageal reflux disease)   . History of influenza   . Hyperlipidemia   . Hypertension   . Sleep apnea   . Thrombocytopenia ([place])   . Type 2 diabetes mellitus ([place])   .  Vitamin B12 deficiency     Patient Active Problem List   Diagnosis Date Noted  . CAP (community acquired pneumonia) 03/16/2017  . Acute on chronic respiratory failure with hypoxia ([place]) 03/16/2017  . IDDM (insulin dependent diabetes mellitus) 03/16/2017  . Pain in limb 08/02/2013  . Salmonella gastroenteritis 02/10/2013  . Gastroenteritis 02/09/2013  . Fever 02/08/2013  . Nausea, vomiting and diarrhea 02/08/2013  . Wheezing 02/06/2012  . Acute bronchitis 02/04/2012  . DM type 2 (diabetes mellitus, type 2) ([place]) 02/04/2012  . Insulin pump in place 02/04/2012  . Dehydration 02/04/2012  . Orthostatic hypotension 02/04/2012  . Hypoglycemia 04/19/2011  . Vitamin B12 deficiency 04/19/2011  . DKA, type 2 ([place]) 04/18/2011  . Hyponatremia 04/18/2011  . Acute renal failure ([place]) 04/18/2011  . CKD (chronic kidney disease), stage II 04/18/2011  . Tachycardia 04/18/2011  . Thrombocytopenia ([place]) 04/18/2011  . History of influenza 04/18/2011  . Tobacco abuse 04/18/2011  . Nausea and vomiting in adult 04/18/2011    Past Surgical History:  Procedure Laterality Date  . COLONOSCOPY WITH PROPOFOL N/A 01/04/2020   Procedure: COLONOSCOPY WITH PROPOFOL;  Surgeon: Rogene Houston, MD;  Location: [facility] ENDO SUITE;  Service: Endoscopy;  Laterality: N/A;  730  . HEMORRHOID SURGERY    . INFUSION PUMP IMPLANTATION     Insulin  . NASAL SEPTUM SURGERY    . POLYPECTOMY  01/04/2020   Procedure: POLYPECTOMY;  Surgeon:  Rogene Houston, MD;  Location: [facility] ENDO SUITE;  Service: Endoscopy;;       Family History  Problem Relation Age of Onset  . Hypertension Mother   . Hypertension Father   . Heart disease Father     Social History   Tobacco Use  . Smoking status: Current Every Day Smoker    Packs/day: 1.50    Years: 27.00    Pack years: 40.50    Types: Cigarettes  . Smokeless tobacco: Never Used  Vaping Use  . Vaping Use: Never used  Substance Use Topics  . Alcohol use: Yes    Comment:  Occasional  . Drug use: No    Home Medications Prior to Admission medications   Medication Sig Start Date End Date Taking? Authorizing Provider  benzonatate (TESSALON) 200 MG capsule Take 200 mg by mouth 3 (three) times daily as needed for cough. 04/11/20  Yes [provider]  diphenoxylate-atropine (LOMOTIL) 2.5-0.025 MG tablet Take 1 tablet by mouth every 4 (four) hours as needed. 04/09/20  Yes [provider]  losartan (COZAAR) 100 MG tablet Take 100 mg daily by mouth. 03/13/17  Yes [provider]  Multiple Vitamins-Minerals (CENTRUM PO) Take 1 tablet by mouth daily.   Yes [provider]  NOVOLOG 100 UNIT/ML injection Inject 30 Units into the skin daily. In insulin pump 07/08/17  Yes [provider]  ondansetron (ZOFRAN ODT) 4 MG disintegrating tablet Take 1 tablet (4 mg total) by mouth every 8 (eight) hours as needed for nausea or vomiting. 04/10/20  Yes Petrucelli, Samantha R, PA-C  pantoprazole (PROTONIX) 40 MG tablet Take 40 mg daily by mouth. 02/03/17  Yes [provider]  simvastatin (ZOCOR) 40 MG tablet Take 40 mg by mouth daily.   Yes [provider]  ciprofloxacin (CIPRO) 500 MG tablet Take 500 mg by mouth 2 (two) times daily. Patient not taking: Reported on 04/15/2020 04/09/20   [provider]    Allergies    Codeine and Glucophage [metformin hydrochloride]  Review of Systems   Review of Systems  Constitutional: Positive for fatigue. Negative for fever.  HENT: Negative for congestion and sore throat.   Eyes: Negative.   Respiratory: Positive for cough, shortness of breath and wheezing. Negative for chest tightness.   Cardiovascular: Positive for palpitations. Negative for chest pain and leg swelling.  Gastrointestinal: Positive for diarrhea, nausea and vomiting. Negative for abdominal pain.  Genitourinary: Negative.  Negative for dysuria.  Musculoskeletal: Negative for arthralgias, joint swelling and  neck pain.  Skin: Negative.  Negative for rash and wound.  Neurological: Negative for dizziness, weakness, light-headedness, numbness and headaches.  Psychiatric/Behavioral: Negative.   All other systems reviewed and are negative.   Physical Exam Updated Vital Signs BP (!) 111/41   Pulse (!) 150   Temp 98.8 F (37.1 C) (Oral)   Resp (!) 23   Ht 5\' 2"  (1.575 m)   Wt 83 kg   SpO2 94%   BMI 33.47 kg/m   Physical Exam Vitals and nursing note reviewed.  Constitutional:      Appearance: He is well-developed and well-nourished. He is ill-appearing.  HENT:     Head: Normocephalic and atraumatic.     Mouth/Throat:     Mouth: Mucous membranes are dry.  Eyes:     Conjunctiva/sclera: Conjunctivae normal.  Cardiovascular:     Rate and Rhythm: Regular rhythm. Tachycardia present.     Pulses: Intact distal pulses.     Heart sounds: Normal  heart sounds.  Pulmonary:     Effort: Pulmonary effort is normal. Prolonged expiration present. No accessory muscle usage or retractions.     Breath sounds: Wheezing present. No rhonchi.     Comments: Wheezing throughout all lung fields with prolonged expirations.  No sensory muscle usage.  Mild tachypnea at presentation. Abdominal:     General: Bowel sounds are normal.     Palpations: Abdomen is soft.     Tenderness: There is no abdominal tenderness. There is no guarding.  Musculoskeletal:        General: Normal range of motion.     Cervical back: Normal range of motion.     Right lower leg: No edema.     Left lower leg: No edema.  Skin:    General: Skin is warm and dry.  Neurological:     General: No focal deficit present.     Mental Status: He is alert and oriented to person, place, and time.  Psychiatric:        Mood and Affect: Mood and affect normal.     ED Results / Procedures / Treatments   Labs (all labs ordered are listed, but only abnormal results are displayed) Labs Reviewed  URINALYSIS, ROUTINE W REFLEX MICROSCOPIC -  Abnormal; Notable for the following components:      Result Value   Color, Urine AMBER (*)    APPearance HAZY (*)    Hgb urine dipstick SMALL (*)    Ketones, ur 80 (*)    Protein, ur 100 (*)    All other components within normal limits  CBC WITH DIFFERENTIAL/PLATELET - Abnormal; Notable for the following components:   WBC 11.3 (*)    Neutro Abs 9.5 (*)    All other components within normal limits  COMPREHENSIVE METABOLIC PANEL - Abnormal; Notable for the following components:   Glucose, Bld 167 (*)    BUN 23 (*)    All other components within normal limits  D-DIMER, QUANTITATIVE (NOT AT [facility]) - Abnormal; Notable for the following components:   D-Dimer, Quant 1.45 (*)    All other components within normal limits  CBG MONITORING, ED - Abnormal; Notable for the following components:   Glucose-Capillary 169 (*)    All other components within normal limits  RESP PANEL BY RT-PCR (FLU A&B, COVID) ARPGX2  C DIFFICILE QUICK SCREEN W PCR REFLEX  GASTROINTESTINAL PANEL BY PCR, STOOL (REPLACES STOOL CULTURE)  LIPASE, BLOOD    EKG EKG Interpretation  Date/Time:  Sunday April 15 2020 17:33:57 EST Ventricular Rate:  113 PR Interval:    QRS Duration: 82 QT Interval:  373 QTC Calculation: 512 R Axis:   77 Text Interpretation: Sinus tachycardia Abnormal R-wave progression, early transition Borderline T wave abnormalities Prolonged QT interval Confirmed by Milton Ferguson [phone]) on 04/15/2020 11:02:17 PM   Radiology CT Angio Chest PE W and/or Wo Contrast  Result Date: 04/15/2020 CLINICAL DATA:  Dyspnea on exertion, tachycardia, nausea and vomiting, positive D dimer EXAM: CT ANGIOGRAPHY CHEST WITH CONTRAST TECHNIQUE: Multidetector CT imaging of the chest was performed using the standard protocol during bolus administration of intravenous contrast. Multiplanar CT image reconstructions and MIPs were obtained to evaluate the vascular anatomy. CONTRAST:  170mL OMNIPAQUE IOHEXOL 350 MG/ML  SOLN COMPARISON:  03/16/2017, 04/15/2020 FINDINGS: Cardiovascular: This is a technically adequate evaluation of the pulmonary vasculature. No filling defects or pulmonary emboli. The heart is unremarkable without pericardial effusion. Normal appearance of the thoracic aorta. Mediastinum/Nodes: Borderline enlarged mediastinal lymph nodes are  stable and likely benign. No pathologic adenopathy. Thyroid, trachea, and esophagus are unremarkable. Lungs/Pleura: No airspace disease, effusion, or pneumothorax. There is mild bronchial wall thickening most pronounced in the bilateral lower lobes. Central airways are patent. Upper Abdomen: No acute abnormality. Musculoskeletal: No acute or destructive bony lesions. Reconstructed images demonstrate no additional findings. Review of the MIP images confirms the above findings. IMPRESSION: 1. No evidence of pulmonary embolus. 2. Lower lobe bronchial wall thickening compatible with bronchitis. No acute airspace disease. Electronically Signed   By: Randa Ngo M.D.   On: 04/15/2020 23:16   DG Chest Portable 1 View  Result Date: 04/15/2020 CLINICAL DATA:  Short of breath, cough, emesis EXAM: PORTABLE CHEST 1 VIEW COMPARISON:  04/12/2020 FINDINGS: Single frontal view of the chest demonstrates an unremarkable cardiac silhouette. Background emphysema without airspace disease, effusion, or pneumothorax. No acute bony abnormalities. IMPRESSION: 1. No acute intrathoracic process. Electronically Signed   By: Randa Ngo M.D.   On: 04/15/2020 16:57    Procedures Procedures (including critical care time)  Medications Ordered in ED Medications  albuterol (VENTOLIN HFA) 108 (90 Base) MCG/ACT inhaler 3 puff (3 puffs Inhalation Given 04/15/20 1644)  sodium chloride 0.9 % bolus 1,000 mL (0 mLs Intravenous Stopped 04/15/20 1800)  promethazine (PHENERGAN) injection 12.5 mg (12.5 mg Intravenous Given 04/15/20 1647)  aerochamber Z-Stat Plus/medium 1 each (1 each Other Given  04/15/20 1644)  sodium chloride 0.9 % bolus 1,000 mL (0 mLs Intravenous Stopped 04/15/20 1922)  ipratropium-albuterol (DUONEB) 0.5-2.5 (3) MG/3ML nebulizer solution 3 mL (3 mLs Nebulization Given 04/15/20 1804)  albuterol (PROVENTIL) (2.5 MG/3ML) 0.083% nebulizer solution 2.5 mg (2.5 mg Nebulization Given 04/15/20 1804)  albuterol (PROVENTIL,VENTOLIN) solution continuous neb (10 mg Nebulization Given 04/15/20 2215)  methylPREDNISolone sodium succinate (SOLU-MEDROL) 125 mg/2 mL injection 80 mg (80 mg Intravenous Given 04/15/20 2038)  chlorpheniramine-HYDROcodone (TUSSIONEX) 10-8 MG/5ML suspension 5 mL (5 mLs Oral Given 04/15/20 2131)  iohexol (OMNIPAQUE) 350 MG/ML injection 100 mL (100 mLs Intravenous Contrast Given 04/15/20 2251)    ED Course  I have reviewed the triage vital signs and the nursing notes.  Pertinent labs & imaging results that were available during my care of the patient were reviewed by me and considered in my medical decision making (see chart for details).    MDM Rules/Calculators/A&P                          Patient with a 10 day history of nausea vomiting, diarrhea generalized weakness along with wheezing and shortness of breath.  Upon initial presentation he appeared dehydrated, he was given IV fluids 2 L normal saline along with Phenergan per IV.  He had no further nausea or vomiting after receiving this medication.  He was also given albuterol MDI treatment while awaiting his Covid testing.  The MDI did not improve his wheezing, Covid test was negative therefore an albuterol and Atrovent neb was ordered.  Solu-Medrol IV also given.  He had no improvement in his shortness of breath or his respiratory exam with persistent wheezing and reduced aeration.  This did increase his tachycardia however.  He was able to tolerate p.o. intake while here and had no further complaints of vomiting or nausea.  Orthostatics were obtained, with his blood pressures remaining stable, his pulses  remain tachycardic 112 with supine and sitting, pulse increasing to 125 standing.  Reports generalized weakness, increased sob with ambulation - pulse remained 93% and above with ambulation, but pulse  consistently 130.  Pt denies CP or pleuritic pain, but given persistent tachycardia, d dimer added and elevated.  Ct angio negative for PE, suggests bronchitis.   Discussed pt with Dr. Josephine Cables accepts pt for admission.  Also requested c dif and GI panel which has been ordered.      Final Clinical Impression(s) / ED Diagnoses Final diagnoses:  Nausea vomiting and diarrhea  Bronchitis with acute wheezing    Rx / DC Orders ED Discharge Orders    None       Landis Martins 04/15/20 2332    Milton Ferguson, MD 04/16/20 1520

## 2020-04-16 ENCOUNTER — Other Ambulatory Visit: Payer: Self-pay

## 2020-04-16 DIAGNOSIS — R9431 Abnormal electrocardiogram [ECG] [EKG]: Secondary | ICD-10-CM

## 2020-04-16 DIAGNOSIS — J40 Bronchitis, not specified as acute or chronic: Secondary | ICD-10-CM

## 2020-04-16 DIAGNOSIS — E785 Hyperlipidemia, unspecified: Secondary | ICD-10-CM

## 2020-04-16 DIAGNOSIS — E1110 Type 2 diabetes mellitus with ketoacidosis without coma: Principal | ICD-10-CM

## 2020-04-16 DIAGNOSIS — N179 Acute kidney failure, unspecified: Secondary | ICD-10-CM

## 2020-04-16 LAB — CBC
HCT: 38.7 % — ABNORMAL LOW (ref 39.0–52.0)
Hemoglobin: 12.4 g/dL — ABNORMAL LOW (ref 13.0–17.0)
MCH: 30.1 pg (ref 26.0–34.0)
MCHC: 32 g/dL (ref 30.0–36.0)
MCV: 93.9 fL (ref 80.0–100.0)
Platelets: 148 10*3/uL — ABNORMAL LOW (ref 150–400)
RBC: 4.12 MIL/uL — ABNORMAL LOW (ref 4.22–5.81)
RDW: 11.9 % (ref 11.5–15.5)
WBC: 7.1 10*3/uL (ref 4.0–10.5)
nRBC: 0 % (ref 0.0–0.2)

## 2020-04-16 LAB — BASIC METABOLIC PANEL
Anion gap: 9 (ref 5–15)
Anion gap: 9 (ref 5–15)
Anion gap: 9 (ref 5–15)
BUN: 25 mg/dL — ABNORMAL HIGH (ref 6–20)
BUN: 23 mg/dL — ABNORMAL HIGH (ref 6–20)
BUN: 22 mg/dL — ABNORMAL HIGH (ref 6–20)
CO2: 17 mmol/L — ABNORMAL LOW (ref 22–32)
CO2: 18 mmol/L — ABNORMAL LOW (ref 22–32)
CO2: 22 mmol/L (ref 22–32)
Calcium: 8.2 mg/dL — ABNORMAL LOW (ref 8.9–10.3)
Calcium: 8.4 mg/dL — ABNORMAL LOW (ref 8.9–10.3)
Calcium: 8.4 mg/dL — ABNORMAL LOW (ref 8.9–10.3)
Chloride: 110 mmol/L (ref 98–111)
Chloride: 112 mmol/L — ABNORMAL HIGH (ref 98–111)
Chloride: 113 mmol/L — ABNORMAL HIGH (ref 98–111)
Creatinine, Ser: 1.56 mg/dL — ABNORMAL HIGH (ref 0.61–1.24)
Creatinine, Ser: 1.34 mg/dL — ABNORMAL HIGH (ref 0.61–1.24)
Creatinine, Ser: 1.15 mg/dL (ref 0.61–1.24)
GFR, Estimated: 53 mL/min — ABNORMAL LOW (ref >60)
GFR, Estimated: >60 mL/min (ref >60)
GFR, Estimated: >60 mL/min (ref >60)
Glucose, Bld: 604 mg/dL (ref 70–99)
Glucose, Bld: 331 mg/dL — ABNORMAL HIGH (ref 70–99)
Glucose, Bld: 144 mg/dL — ABNORMAL HIGH (ref 70–99)
Potassium: 3.1 mmol/L — ABNORMAL LOW (ref 3.5–5.1)
Potassium: 2.8 mmol/L — ABNORMAL LOW (ref 3.5–5.1)
Potassium: 3.7 mmol/L (ref 3.5–5.1)
Sodium: 136 mmol/L (ref 135–145)
Sodium: 139 mmol/L (ref 135–145)
Sodium: 144 mmol/L (ref 135–145)

## 2020-04-16 LAB — COMPREHENSIVE METABOLIC PANEL
ALT: 24 U/L (ref 0–44)
AST: 23 U/L (ref 15–41)
Albumin: 3.2 g/dL — ABNORMAL LOW (ref 3.5–5.0)
Alkaline Phosphatase: 43 U/L (ref 38–126)
BUN: 23 mg/dL — ABNORMAL HIGH (ref 6–20)
CO2: 10 mmol/L — ABNORMAL LOW (ref 22–32)
Calcium: 7.9 mg/dL — ABNORMAL LOW (ref 8.9–10.3)
Chloride: 102 mmol/L (ref 98–111)
Creatinine, Ser: 1.81 mg/dL — ABNORMAL HIGH (ref 0.61–1.24)
GFR, Estimated: 45 mL/min — ABNORMAL LOW (ref >60)
Glucose, Bld: 738 mg/dL (ref 70–99)
Potassium: 3.8 mmol/L (ref 3.5–5.1)
Sodium: 135 mmol/L (ref 135–145)
Total Bilirubin: 0.9 mg/dL (ref 0.3–1.2)
Total Protein: 6.3 g/dL — ABNORMAL LOW (ref 6.5–8.1)

## 2020-04-16 LAB — GLUCOSE, CAPILLARY
Glucose-Capillary: 144 mg/dL — ABNORMAL HIGH (ref 70–99)
Glucose-Capillary: 150 mg/dL — ABNORMAL HIGH (ref 70–99)
Glucose-Capillary: 156 mg/dL — ABNORMAL HIGH (ref 70–99)
Glucose-Capillary: 166 mg/dL — ABNORMAL HIGH (ref 70–99)
Glucose-Capillary: 220 mg/dL — ABNORMAL HIGH (ref 70–99)
Glucose-Capillary: 273 mg/dL — ABNORMAL HIGH (ref 70–99)
Glucose-Capillary: 339 mg/dL — ABNORMAL HIGH (ref 70–99)
Glucose-Capillary: 371 mg/dL — ABNORMAL HIGH (ref 70–99)
Glucose-Capillary: 406 mg/dL — ABNORMAL HIGH (ref 70–99)
Glucose-Capillary: 442 mg/dL — ABNORMAL HIGH (ref 70–99)
Glucose-Capillary: 482 mg/dL — ABNORMAL HIGH (ref 70–99)
Glucose-Capillary: 483 mg/dL — ABNORMAL HIGH (ref 70–99)
Glucose-Capillary: 548 mg/dL (ref 70–99)
Glucose-Capillary: >600 mg/dL (ref 70–99)
Glucose-Capillary: >600 mg/dL (ref 70–99)
Glucose-Capillary: >600 mg/dL (ref 70–99)

## 2020-04-16 LAB — HEMOGLOBIN A1C
Hgb A1c MFr Bld: 7.5 % — ABNORMAL HIGH (ref 4.8–5.6)
Mean Plasma Glucose: 168.55 mg/dL

## 2020-04-16 LAB — BETA-HYDROXYBUTYRIC ACID: Beta-Hydroxybutyric Acid: 5.18 mmol/L — ABNORMAL HIGH (ref 0.05–0.27)

## 2020-04-16 LAB — C DIFFICILE QUICK SCREEN W PCR REFLEX
C Diff antigen: NEGATIVE
C Diff interpretation: NOT DETECTED
C Diff toxin: NEGATIVE

## 2020-04-16 LAB — BLOOD GAS, VENOUS
Acid-base deficit: 8.5 mmol/L — ABNORMAL HIGH (ref 0.0–2.0)
Bicarbonate: 18.5 mmol/L — ABNORMAL LOW (ref 20.0–28.0)
FIO2: 21
O2 Saturation: 99.2 %
Patient temperature: 37
pCO2, Ven: 22.5 mmHg — ABNORMAL LOW (ref 44.0–60.0)
pH, Ven: 7.436 — ABNORMAL HIGH (ref 7.250–7.430)
pO2, Ven: 189 mmHg — ABNORMAL HIGH (ref 32.0–45.0)

## 2020-04-16 LAB — PROTIME-INR
INR: 1.2 (ref 0.8–1.2)
Prothrombin Time: 14.7 seconds (ref 11.4–15.2)

## 2020-04-16 LAB — MRSA PCR SCREENING: MRSA by PCR: NEGATIVE

## 2020-04-16 LAB — PHOSPHORUS: Phosphorus: 5.1 mg/dL — ABNORMAL HIGH (ref 2.5–4.6)

## 2020-04-16 LAB — MAGNESIUM: Magnesium: 1.9 mg/dL (ref 1.7–2.4)

## 2020-04-16 LAB — HIV ANTIBODY (ROUTINE TESTING W REFLEX): HIV Screen 4th Generation wRfx: NONREACTIVE

## 2020-04-16 LAB — APTT: aPTT: 32 seconds (ref 24–36)

## 2020-04-16 MED ORDER — INSULIN PUMP
Freq: Three times a day (TID) | SUBCUTANEOUS | Status: DC
  Filled 2020-04-16: qty 1

## 2020-04-16 MED ORDER — IPRATROPIUM-ALBUTEROL 0.5-2.5 (3) MG/3ML IN SOLN: 3.0000 mL | Freq: Four times a day (QID) | RESPIRATORY_TRACT | Status: DC | PRN

## 2020-04-16 MED ORDER — INSULIN ASPART 100 UNIT/ML ~~LOC~~ SOLN
0.0000 [IU] | Freq: Three times a day (TID) | SUBCUTANEOUS | Status: DC
  Administered 2020-04-17: 09:00:00 1 [IU] via SUBCUTANEOUS

## 2020-04-16 MED ORDER — LACTATED RINGERS IV BOLUS
20.0000 mL/kg | Freq: Once | INTRAVENOUS | Status: AC
  Administered 2020-04-16: 05:00:00 1600 mL via INTRAVENOUS

## 2020-04-16 MED ORDER — SODIUM CHLORIDE 0.9 % IV SOLN: Freq: Once | INTRAVENOUS | Status: DC

## 2020-04-16 MED ORDER — INSULIN GLARGINE 100 UNIT/ML ~~LOC~~ SOLN
10.0000 [IU] | SUBCUTANEOUS | Status: DC
  Administered 2020-04-16: 22:00:00 10 [IU] via SUBCUTANEOUS
  Filled 2020-04-16 (×2): qty 0.1

## 2020-04-16 MED ORDER — DEXTROSE IN LACTATED RINGERS 5 % IV SOLN: INTRAVENOUS | Status: DC

## 2020-04-16 MED ORDER — ENOXAPARIN SODIUM 40 MG/0.4ML ~~LOC~~ SOLN
40.0000 mg | SUBCUTANEOUS | Status: DC
  Administered 2020-04-16 – 2020-04-18 (×3): 40 mg via SUBCUTANEOUS
  Filled 2020-04-16 (×3): qty 0.4

## 2020-04-16 MED ORDER — CHLORHEXIDINE GLUCONATE CLOTH 2 % EX PADS
6.0000 | MEDICATED_PAD | Freq: Every day | CUTANEOUS | Status: DC
  Administered 2020-04-16 – 2020-04-17 (×2): 6 via TOPICAL

## 2020-04-16 MED ORDER — LEVALBUTEROL HCL 0.63 MG/3ML IN NEBU: 0.6300 mg | INHALATION_SOLUTION | Freq: Four times a day (QID) | RESPIRATORY_TRACT | Status: DC | PRN

## 2020-04-16 MED ORDER — METHYLPREDNISOLONE SODIUM SUCC 40 MG IJ SOLR: 40.0000 mg | Freq: Two times a day (BID) | INTRAMUSCULAR | Status: DC

## 2020-04-16 MED ORDER — POTASSIUM CHLORIDE 10 MEQ/100ML IV SOLN
10.0000 meq | INTRAVENOUS | Status: AC
  Administered 2020-04-16 (×2): 10 meq via INTRAVENOUS
  Filled 2020-04-16: qty 100

## 2020-04-16 MED ORDER — SODIUM CHLORIDE 0.9 % IV BOLUS
500.0000 mL | Freq: Once | INTRAVENOUS | Status: AC
  Administered 2020-04-16: 02:00:00 500 mL via INTRAVENOUS

## 2020-04-16 MED ORDER — DM-GUAIFENESIN ER 30-600 MG PO TB12
1.0000 | ORAL_TABLET | Freq: Two times a day (BID) | ORAL | Status: DC
  Administered 2020-04-16 – 2020-04-18 (×5): 1 via ORAL
  Filled 2020-04-16 (×5): qty 1

## 2020-04-16 MED ORDER — LACTATED RINGERS IV SOLN: INTRAVENOUS | Status: DC

## 2020-04-16 MED ORDER — INSULIN ASPART 100 UNIT/ML ~~LOC~~ SOLN: 0.0000 [IU] | Freq: Every day | SUBCUTANEOUS | Status: DC

## 2020-04-16 MED ORDER — LACTATED RINGERS IV SOLN: INTRAVENOUS | Status: AC

## 2020-04-16 MED ORDER — PROCHLORPERAZINE EDISYLATE 10 MG/2ML IJ SOLN: 10.0000 mg | Freq: Four times a day (QID) | INTRAMUSCULAR | Status: DC | PRN

## 2020-04-16 MED ORDER — INSULIN REGULAR(HUMAN) IN NACL 100-0.9 UT/100ML-% IV SOLN
INTRAVENOUS | Status: DC
  Administered 2020-04-16: 10:00:00 18 [IU]/h via INTRAVENOUS
  Administered 2020-04-16: 05:00:00 15 [IU]/h via INTRAVENOUS
  Filled 2020-04-16 (×2): qty 100

## 2020-04-16 MED ORDER — PANTOPRAZOLE SODIUM 40 MG PO TBEC
40.0000 mg | DELAYED_RELEASE_TABLET | Freq: Every day | ORAL | Status: DC
  Administered 2020-04-16 – 2020-04-18 (×3): 40 mg via ORAL
  Filled 2020-04-16 (×3): qty 1

## 2020-04-16 MED ORDER — DEXTROSE 50 % IV SOLN
0.0000 mL | INTRAVENOUS | Status: DC | PRN
  Administered 2020-04-17 (×2): 50 mL via INTRAVENOUS
  Administered 2020-04-18: 08:00:00 25 mL via INTRAVENOUS
  Filled 2020-04-16 (×3): qty 50

## 2020-04-16 NOTE — Progress Notes (Signed)
Patient started on DKA protocol and insulin drip. Explained process to patient and witnessed patient suspend insulin pump while drip is infusing. Patient understands process and states that he will leave pump suspended while receiving IV insulin infusion.

## 2020-04-16 NOTE — TOC Initial Note (Signed)
Transition of Care [facility]) - Initial/Assessment Note    Patient Details  Name: [name] MRN: [id number] Date of Birth: 06/29/1970  Transition of [facility]) CM/SW Contact:    Salome Arnt, Rowena Phone Number: 04/16/2020, 9:59 AM  Clinical Narrative:  Pt admitted due to DKA. Assessment completed with pt due to high risk readmission score. Pt states he lives with his wife and is independent at baseline. He works full-time as a Freight forwarder. Pt will return home when medically stable. No needs reported at this time. He is agreeable to scheduling hospital follow up appointment with PCP, Dr. Hilma Favors at d/c. [facility] will continue to follow.                  Expected Discharge Plan: Home/Self Care Barriers to Discharge: Continued Medical Work up   Patient Goals and CMS Choice Patient states their goals for this hospitalization and ongoing recovery are:: return home      Expected Discharge Plan and Services Expected Discharge Plan: Home/Self Care In-house Referral: Clinical Social Work   Post Acute Care Choice: NA Living arrangements for the past 2 months: Single Family Home                 DME Arranged: N/A         HH Arranged: NA          Prior Living Arrangements/Services Living arrangements for the past 2 months: Single Family Home Lives with:: Spouse Patient language and need for interpreter reviewed:: Yes Do you feel safe going back to the place where you live?: Yes      Need for Family Participation in Patient Care: No (Comment)     Criminal Activity/Legal Involvement Pertinent to Current Situation/Hospitalization: No - Comment as needed  Activities of Daily Living Home Assistive Devices/Equipment: None ADL Screening (condition at time of admission) Patient's cognitive ability adequate to safely complete daily activities?: Yes Is the patient deaf or have difficulty hearing?: No Does the patient have difficulty seeing, even when wearing  glasses/contacts?: No Does the patient have difficulty concentrating, remembering, or making decisions?: No Patient able to express need for assistance with ADLs?: Yes Does the patient have difficulty dressing or bathing?: No Independently performs ADLs?: Yes (appropriate for developmental age) Does the patient have difficulty walking or climbing stairs?: No Weakness of Legs: None Weakness of Arms/Hands: None  Permission Sought/Granted                  Emotional Assessment   Attitude/Demeanor/Rapport: Engaged Affect (typically observed): Appropriate Orientation: : Oriented to Self,Oriented to Place,Oriented to  Time,Oriented to Situation Alcohol / Substance Use: Not Applicable Psych Involvement: No (comment)  Admission diagnosis:  Nausea vomiting and diarrhea [R11.2, R19.7] Bronchitis with acute wheezing [J40] Patient Active Problem List   Diagnosis Date Noted  . Hyperlipidemia 04/16/2020  . Prolonged QT interval 04/16/2020  . Nausea vomiting and diarrhea 04/15/2020  . CAP (community acquired pneumonia) 03/16/2017  . Acute on chronic respiratory failure with hypoxia ([place]) 03/16/2017  . IDDM (insulin dependent diabetes mellitus) 03/16/2017  . Pain in limb 08/02/2013  . Salmonella gastroenteritis 02/10/2013  . Gastroenteritis 02/09/2013  . Fever 02/08/2013  . Nausea, vomiting and diarrhea 02/08/2013  . Wheezing 02/06/2012  . Acute bronchitis 02/04/2012  . DM type 2 (diabetes mellitus, type 2) ([place]) 02/04/2012  . Insulin pump in place 02/04/2012  . Dehydration 02/04/2012  . Orthostatic hypotension 02/04/2012  . Hypoglycemia 04/19/2011  . Vitamin B12 deficiency 04/19/2011  .  DKA, type 2 ([place]) 04/18/2011  . Hyponatremia 04/18/2011  . Acute renal failure ([place]) 04/18/2011  . CKD (chronic kidney disease), stage II 04/18/2011  . Tachycardia 04/18/2011  . Thrombocytopenia ([place]) 04/18/2011  . History of influenza 04/18/2011  . Tobacco abuse 04/18/2011  . Nausea and  vomiting in adult 04/18/2011   PCP:  Sharilyn Sites, MD Pharmacy:   [place], [place] - [place] [address] [place] [address] [place] [address] Phone: [phone] Fax: [phone]     Social Determinants of Health (SDOH) Interventions    Readmission Risk Interventions Readmission Risk Prevention Plan 04/16/2020  Transportation Screening Complete  HRI or Home Care Consult Complete  Social Work Consult for [place] Planning/Counseling Complete  Palliative Care Screening Not Applicable  Medication Review Press photographer) Complete  Some recent data might be hidden

## 2020-04-16 NOTE — Progress Notes (Signed)
Late Entry  Upon arrival to ICU from ED patient's HR elevated between 135-145, patient dyspneic on 2L . On glucometer patient's CBG >600. MD made aware. Lab orders placed.

## 2020-04-16 NOTE — Progress Notes (Signed)
PROGRESS NOTE    [name]  EUM:[id number] DOB: January 03, 1971 DOA: 04/15/2020 PCP: Sharilyn Sites, MD   Chief Complaint  Patient presents with   Emesis    Brief Narrative:  As per H&P written by Dr. Josephine Cables on 04/15/2020 [name] is a 49 y.o. male with medical history significant for obesity, hyperlipidemia, type 2 diabetes mellitus on insulin pump and GERD who presents to the emergency department due to 9-day onset of nausea, vomiting and diarrhea.  Vomiting was approximately 5 episodes daily with vomiting of food contents with no blood.  Diarrhea was several episodes daily and was mainly watery in nature.  He was unable to keep anything down, he saw his PCP on 12/13 and was prescribed with ciprofloxacin due to presumed UTI.  Patient also complained of cough and increasing shortness of breath which started about the same time. He denies any abdominal pain, fever, chills, sick contacts.  Patient was seen on 12/14 in the ED and was noted to be dehydrated with symptoms suspected to be due to viral.  Patient returned to the [facility] ED on 12/16 due to shortness of breath and was diagnosed to have gastroenteritis and was discharged home with antiemetics as needed.  ED Course:  In the emergency department, he was tachycardic and tachypneic.  BP was 130/77 and O2 sat was 93-100% on room air.  Work-up in the ED showed mild leukocytosis, hyperglycemia.  D-dimer 1.45.  Urinalysis was unimpressive for UTI.  Respiratory panel for influenza A, B and SARS coronavirus was negative. CT angiography chest showed no evidence of PE but showed lower lobe bronchial wall thickening compatible with bronchitis with no acute airspace disease. Chest x-ray showed no acute intrathoracic process Breathing treatment was provided, IV Solu-Medrol was given.  Phenergan was given due to nausea and vomiting and IV hydration was provided.  Hospitalist was asked to admit patient for further evaluation and management.  Assessment &  Plan: 1-DKA -Most likely triggered by recent use of steroids -Chronically on insulin pump -Continue DKA protocol and insulin drip -Follow electrolytes and replete as needed -Continue IV fluids.  2-Nausea, vomiting and diarrhea -Reports no further nausea, vomiting or diarrhea since arrival to the ED -Continue close monitoring -Provide hydration -Replete electrolytes as needed.  3-bronchitis -Recently treated with steroids and antibiotics as an outpatient -Currently will use antitussive medications and the use of bronchodilator management. -Patient has a prior history of tobacco abuse; will benefit of outpatient follow-up with pulmonology for PFTs in case he has an underlying undiagnosed COPD.  4-acute renal failure -In the setting of dehydration from DKA -Continue avoiding nephrotoxic agent Maintain adequate hydration -Follow renal function trend -Creatinine improving appropriately.  5-Tobacco abuse -Cessation counseling provided.  6-hyperlipidemia -will continue statins.  7-GERD -continue PPI  8-HTN -stable currently -Holding Cozaar in the setting of acute renal failure.   DVT prophylaxis: Lovenox Code Status: Full code Family Communication: Wife updated over the phone. Disposition:   Status is: Inpatient  Dispo: The patient is from: Home              Anticipated d/c is to: Home              Anticipated d/c date is: 1-2 days.              Patient currently no medically stable for discharge; patient with a still ongoing DKA process.  Continue insulin drip, close monitoring of electrolytes and CBGs.     Consultants:   None  Procedures:  See below for x-ray reports   Antimicrobials:  None   Subjective: Expressing tachypnea with exertion and ongoing wheezing.  Also positive palpitations.  Reports no further nausea or vomiting at this time.  Would like to have something to drink.  Objective: Vitals:   04/16/20 0600 04/16/20 1000 04/16/20 1100 04/16/20  1200  BP: (!) 96/31 (!) 119/56 (!) 115/57 (!) 118/57  Pulse: (!) 129 (!) 112 (!) 112 (!) 112  Resp: (!) 29 16 17 18   Temp:      TempSrc:      SpO2: 92% 94% 93% 95%  Weight:      Height:        Intake/Output Summary (Last 24 hours) at 04/16/2020 1652 Last data filed at 04/16/2020 0644 Gross per 24 hour  Intake 3305.94 ml  Output 550 ml  Net 2755.94 ml   Filed Weights   04/15/20 1524  Weight: 83 kg    Examination:  General exam: Afebrile, no chest pain, no nausea vomiting currently.  Patient expressed intermittent episodes of shortness of breath with activity and having wheezing. Respiratory system: Positive scattered rhonchi; positive expiratory wheezing.  No using accessory muscle.  Mild tachypnea with exertion appreciated. Cardiovascular system: Sinus tachycardia, no rubs, no gallops, no JVD. Gastrointestinal system: Abdomen is nondistended, soft and nontender. No organomegaly or masses felt. Normal bowel sounds heard. Central nervous system: Alert and oriented. No focal neurological deficits. Extremities: No cyanosis or clubbing. Skin: No petechiae. Psychiatry: Judgement and insight appear normal. Mood & affect appropriate.     Data Reviewed: I have personally reviewed following labs and imaging studies  CBC: Recent Labs  Lab 04/10/20 1321 04/12/20 1309 04/15/20 1629 04/16/20 0206  WBC 7.3 6.3 11.3* 7.1  NEUTROABS  --  3.6 9.5*  --   HGB 15.8 15.1 15.0 12.4*  HCT 47.1 43.6 43.9 38.7*  MCV 90.2 88.3 88.5 93.9  PLT 141* 119* 161 148*    Basic Metabolic Panel: Recent Labs  Lab 04/10/20 1321 04/12/20 1309 04/15/20 1629 04/16/20 0206 04/16/20 0746 04/16/20 1219  NA 136 134* 135 135 136 139  K 3.8 3.8 3.6 3.8 3.1* 2.8*  CL 103 99 100 102 110 112*  CO2 23 22 25  10* 17* 18*  GLUCOSE 179* 141* 167* 738* 604* 331*  BUN 16 17 23* 23* 25* 23*  CREATININE 1.27* 1.53* 1.17 1.81* 1.56* 1.34*  CALCIUM 8.7* 8.6* 9.0 7.9* 8.2* 8.4*  MG 1.7  --   --  1.9  --   --    PHOS  --   --   --  5.1*  --   --     GFR: Estimated Creatinine Clearance: 60.9 mL/min (A) (by C-G formula based on SCr of 1.34 mg/dL (H)).  Liver Function Tests: Recent Labs  Lab 04/10/20 1321 04/12/20 1309 04/15/20 1629 04/16/20 0206  AST 27 29 19 23   ALT 35 27 20 24   ALKPHOS 61 52 50 43  BILITOT 0.8 1.0 1.1 0.9  PROT 7.0 6.6 7.2 6.3*  ALBUMIN 4.0 3.4* 3.8 3.2*    CBG: Recent Labs  Lab 04/16/20 1135 04/16/20 1258 04/16/20 1406 04/16/20 1511 04/16/20 1633  GLUCAP 339* 273* 220* 166* 144*    Recent Results (from the past 240 hour(s))  Resp Panel by RT-PCR (Flu A&B, Covid) Nasopharyngeal Swab     Status: None   Collection Time: 04/10/20  2:52 PM   Specimen: Nasopharyngeal Swab; Nasopharyngeal(NP) swabs in vial transport medium  Result Value Ref Range  Status   SARS Coronavirus 2 by RT PCR NEGATIVE NEGATIVE Final    Comment: (NOTE) SARS-CoV-2 target nucleic acids are NOT DETECTED.  The SARS-CoV-2 RNA is generally detectable in upper respiratory specimens during the acute phase of infection. The lowest concentration of SARS-CoV-2 viral copies this assay can detect is 138 copies/mL. A negative result does not preclude SARS-Cov-2 infection and should not be used as the sole basis for treatment or other patient management decisions. A negative result may occur with  improper specimen collection/handling, submission of specimen other than nasopharyngeal swab, presence of viral mutation(s) within the areas targeted by this assay, and inadequate number of viral copies(<138 copies/mL). A negative result must be combined with clinical observations, patient history, and epidemiological information. The expected result is Negative.  Fact Sheet for Patients:  EntrepreneurPulse.com.au  Fact Sheet for Healthcare Providers:  IncredibleEmployment.be  This test is no t yet approved or cleared by the [place] FDA and  has been  authorized for detection and/or diagnosis of SARS-CoV-2 by FDA under an Emergency Use Authorization (EUA). This EUA will remain  in effect (meaning this test can be used) for the duration of the COVID-19 declaration under Section 564(b)(1) of the Act, 21 U.S.C.section 360bbb-3(b)(1), unless the authorization is terminated  or revoked sooner.       Influenza A by PCR NEGATIVE NEGATIVE Final   Influenza B by PCR NEGATIVE NEGATIVE Final    Comment: (NOTE) The Xpert Xpress SARS-CoV-2/FLU/RSV plus assay is intended as an aid in the diagnosis of influenza from Nasopharyngeal swab specimens and should not be used as a sole basis for treatment. Nasal washings and aspirates are unacceptable for Xpert Xpress SARS-CoV-2/FLU/RSV testing.  Fact Sheet for Patients: EntrepreneurPulse.com.au  Fact Sheet for Healthcare Providers: IncredibleEmployment.be  This test is not yet approved or cleared by the [place] FDA and has been authorized for detection and/or diagnosis of SARS-CoV-2 by FDA under an Emergency Use Authorization (EUA). This EUA will remain in effect (meaning this test can be used) for the duration of the COVID-19 declaration under Section 564(b)(1) of the Act, 21 U.S.C. section 360bbb-3(b)(1), unless the authorization is terminated or revoked.  Performed at [facility], [address] [place], [place] [address]   Resp Panel by RT-PCR (Flu A&B, Covid) Nasopharyngeal Swab     Status: None   Collection Time: 04/15/20  4:31 PM   Specimen: Nasopharyngeal Swab; Nasopharyngeal(NP) swabs in vial transport medium  Result Value Ref Range Status   SARS Coronavirus 2 by RT PCR NEGATIVE NEGATIVE Final    Comment: (NOTE) SARS-CoV-2 target nucleic acids are NOT DETECTED.  The SARS-CoV-2 RNA is generally detectable in upper respiratory specimens during the acute phase of infection. The lowest concentration of SARS-CoV-2 viral copies this assay can  detect is 138 copies/mL. A negative result does not preclude SARS-Cov-2 infection and should not be used as the sole basis for treatment or other patient management decisions. A negative result may occur with  improper specimen collection/handling, submission of specimen other than nasopharyngeal swab, presence of viral mutation(s) within the areas targeted by this assay, and inadequate number of viral copies(<138 copies/mL). A negative result must be combined with clinical observations, patient history, and epidemiological information. The expected result is Negative.  Fact Sheet for Patients:  EntrepreneurPulse.com.au  Fact Sheet for Healthcare Providers:  IncredibleEmployment.be  This test is no t yet approved or cleared by the [place] FDA and  has been authorized for detection and/or diagnosis of SARS-CoV-2 by  FDA under an Emergency Use Authorization (EUA). This EUA will remain  in effect (meaning this test can be used) for the duration of the COVID-19 declaration under Section 564(b)(1) of the Act, 21 U.S.C.section 360bbb-3(b)(1), unless the authorization is terminated  or revoked sooner.       Influenza A by PCR NEGATIVE NEGATIVE Final   Influenza B by PCR NEGATIVE NEGATIVE Final    Comment: (NOTE) The Xpert Xpress SARS-CoV-2/FLU/RSV plus assay is intended as an aid in the diagnosis of influenza from Nasopharyngeal swab specimens and should not be used as a sole basis for treatment. Nasal washings and aspirates are unacceptable for Xpert Xpress SARS-CoV-2/FLU/RSV testing.  Fact Sheet for Patients: EntrepreneurPulse.com.au  Fact Sheet for Healthcare Providers: IncredibleEmployment.be  This test is not yet approved or cleared by the [place] FDA and has been authorized for detection and/or diagnosis of SARS-CoV-2 by FDA under an Emergency Use Authorization (EUA). This EUA will remain in  effect (meaning this test can be used) for the duration of the COVID-19 declaration under Section 564(b)(1) of the Act, 21 U.S.C. section 360bbb-3(b)(1), unless the authorization is terminated or revoked.  Performed at [facility], [address] [place], [place] [address]   C Difficile Quick Screen w PCR reflex     Status: None   Collection Time: 04/15/20 11:23 PM   Specimen: STOOL  Result Value Ref Range Status   C Diff antigen NEGATIVE NEGATIVE Final   C Diff toxin NEGATIVE NEGATIVE Final   C Diff interpretation No C. difficile detected.  Final    Comment: Performed at [facility], [address], [place], [place] [address]  MRSA PCR Screening     Status: None   Collection Time: 04/16/20  1:26 AM   Specimen: Nasal Mucosa; Nasopharyngeal  Result Value Ref Range Status   MRSA by PCR NEGATIVE NEGATIVE Final    Comment:        The GeneXpert MRSA Assay (FDA approved for NASAL specimens only), is one component of a comprehensive MRSA colonization surveillance program. It is not intended to diagnose MRSA infection nor to guide or monitor treatment for MRSA infections. Performed at [facility], [address] [place], [place] [address]      Radiology Studies: CT Angio Chest PE W and/or Wo Contrast  Result Date: 04/15/2020 CLINICAL DATA:  Dyspnea on exertion, tachycardia, nausea and vomiting, positive D dimer EXAM: CT ANGIOGRAPHY CHEST WITH CONTRAST TECHNIQUE: Multidetector CT imaging of the chest was performed using the standard protocol during bolus administration of intravenous contrast. Multiplanar CT image reconstructions and MIPs were obtained to evaluate the vascular anatomy. CONTRAST:  122mL OMNIPAQUE IOHEXOL 350 MG/ML SOLN COMPARISON:  03/16/2017, 04/15/2020 FINDINGS: Cardiovascular: This is a technically adequate evaluation of the pulmonary vasculature. No filling defects or pulmonary emboli. The heart is unremarkable without pericardial effusion. Normal appearance of the  thoracic aorta. Mediastinum/Nodes: Borderline enlarged mediastinal lymph nodes are stable and likely benign. No pathologic adenopathy. Thyroid, trachea, and esophagus are unremarkable. Lungs/Pleura: No airspace disease, effusion, or pneumothorax. There is mild bronchial wall thickening most pronounced in the bilateral lower lobes. Central airways are patent. Upper Abdomen: No acute abnormality. Musculoskeletal: No acute or destructive bony lesions. Reconstructed images demonstrate no additional findings. Review of the MIP images confirms the above findings. IMPRESSION: 1. No evidence of pulmonary embolus. 2. Lower lobe bronchial wall thickening compatible with bronchitis. No acute airspace disease. Electronically Signed   By: Randa Ngo M.D.   On: 04/15/2020 23:16  DG Chest Portable 1 View  Result Date: 04/15/2020 CLINICAL DATA:  Short of breath, cough, emesis EXAM: PORTABLE CHEST 1 VIEW COMPARISON:  04/12/2020 FINDINGS: Single frontal view of the chest demonstrates an unremarkable cardiac silhouette. Background emphysema without airspace disease, effusion, or pneumothorax. No acute bony abnormalities. IMPRESSION: 1. No acute intrathoracic process. Electronically Signed   By: Randa Ngo M.D.   On: 04/15/2020 16:57    Scheduled Meds:  Chlorhexidine Gluconate Cloth  6 each Topical Daily   dextromethorphan-guaiFENesin  1 tablet Oral BID   enoxaparin (LOVENOX) injection  40 mg Subcutaneous Q24H   pantoprazole  40 mg Oral Daily   Continuous Infusions:  sodium chloride Stopped (04/16/20 0631)   dextrose 5% lactated ringers 125 mL/hr at 04/16/20 1513   insulin 18 Units/hr (04/16/20 1005)   lactated ringers 125 mL/hr at 04/16/20 0954     LOS: 1 day    Time spent: 35 minutes    Barton Dubois, MD [facility]   To contact the attending provider between 7A-7P or the covering provider during after hours 7P-7A, please log into the web site [email or web address] and access using  universal [place] password for that web site. If you do not have the password, please call the hospital operator.  04/16/2020, 4:52 PM

## 2020-04-16 NOTE — Progress Notes (Addendum)
Inpatient Diabetes Program Recommendations  AACE/ADA: New Consensus Statement on Inpatient Glycemic Control (2015)  Target Ranges:  Prepandial:   less than 140 mg/dL      Peak postprandial:   less than 180 mg/dL (1-2 hours)      Critically ill patients:  140 - 180 mg/dL   Lab Results  Component Value Date   YSHUOH 729 (H) 04/16/2020   HGBA1C 7.7 (H) 02/08/2013    Review of Glycemic Control  Diabetes history: DM1 Outpatient Diabetes medications: Medtronic insulin pump with Novolog Current orders for Inpatient glycemic control: IV insulin  Inpatient Diabetes Program Recommendations:   Spoke with patient regarding diabetes and home regimen for diabetes management.  Patient states that he is followed by Dr. Chalmers Cater for diabetes management. Patient works night shift so that is why insulin settings increased during the night. Patient states his CBGs stay approximately the same on his day off. Reviewed with patient he will need to discontinue his current insulin pump insertion site due to coming in in DKA. Patient states he just placed his site yesterday, but reviewed that needed to assure site is good and catheter is not kinked. Patient agrees to remove his site.   Spoke with nurse Lovena Le from Dr. Almetta Lovely endocrinologist's office to get insulin pump current settings.  12 am -4 am 6.2 units/hr 4 am -  8 am  0.6 units/hr 8 am - 1 pm    0.675 units/hr 6:30 am - 12 am 3 units/hr  Carb Coverage Midnight 1:10       1 unit for every 10 grams of carbohydrates 7 pm      1:12       1 unit for every 12 grams of carbohydrates  Insulin Sensitivity 1:35     1 unit drops blood glucose 35 mg/dl  Target Glucose Goals 12A     140 mg/dl  Patient's next office appointment with Dr. Chalmers Cater is January 14th and labs are scheduled for January 7th.  Thank you, Nani Gasser. Vanatta, RN, MSN, CDE  Diabetes Coordinator Inpatient Glycemic Control Team Team Pager [phone] (8am-5pm) 04/16/2020 10:37 AM

## 2020-04-17 LAB — BASIC METABOLIC PANEL
Anion gap: 8 (ref 5–15)
BUN: 23 mg/dL — ABNORMAL HIGH (ref 6–20)
CO2: 21 mmol/L — ABNORMAL LOW (ref 22–32)
Calcium: 8.5 mg/dL — ABNORMAL LOW (ref 8.9–10.3)
Chloride: 111 mmol/L (ref 98–111)
Creatinine, Ser: 1.1 mg/dL (ref 0.61–1.24)
GFR, Estimated: >60 mL/min (ref >60)
Glucose, Bld: 218 mg/dL — ABNORMAL HIGH (ref 70–99)
Potassium: 4.1 mmol/L (ref 3.5–5.1)
Sodium: 140 mmol/L (ref 135–145)

## 2020-04-17 LAB — GASTROINTESTINAL PANEL BY PCR, STOOL (REPLACES STOOL CULTURE)

## 2020-04-17 LAB — GLUCOSE, CAPILLARY
Glucose-Capillary: 137 mg/dL — ABNORMAL HIGH (ref 70–99)
Glucose-Capillary: 194 mg/dL — ABNORMAL HIGH (ref 70–99)
Glucose-Capillary: 94 mg/dL (ref 70–99)
Glucose-Capillary: 24 mg/dL — CL (ref 70–99)
Glucose-Capillary: 26 mg/dL — CL (ref 70–99)
Glucose-Capillary: 126 mg/dL — ABNORMAL HIGH (ref 70–99)
Glucose-Capillary: 52 mg/dL — ABNORMAL LOW (ref 70–99)
Glucose-Capillary: 37 mg/dL — CL (ref 70–99)
Glucose-Capillary: 70 mg/dL (ref 70–99)

## 2020-04-17 MED ORDER — SIMVASTATIN 20 MG PO TABS
40.0000 mg | ORAL_TABLET | Freq: Every day | ORAL | Status: DC
  Administered 2020-04-17 – 2020-04-18 (×2): 40 mg via ORAL
  Filled 2020-04-17 (×2): qty 2

## 2020-04-17 MED ORDER — INSULIN ASPART 100 UNIT/ML ~~LOC~~ SOLN
5.0000 [IU] | Freq: Once | SUBCUTANEOUS | Status: AC
  Administered 2020-04-17: 02:00:00 5 [IU] via SUBCUTANEOUS

## 2020-04-17 MED ORDER — SODIUM CHLORIDE 0.9 % IV SOLN: INTRAVENOUS | Status: DC

## 2020-04-17 MED ORDER — INSULIN PUMP
Freq: Three times a day (TID) | SUBCUTANEOUS | Status: DC
  Filled 2020-04-17: qty 1

## 2020-04-17 MED ORDER — DEXTROSE-NACL 5-0.9 % IV SOLN: INTRAVENOUS | Status: DC

## 2020-04-17 NOTE — Progress Notes (Signed)
1745: Pt called to NT asking for cola, states he feels like his sugar is dropping. NT came to this nurse and relayed message. Advised NT to check pt's blood sugar and if less than 70 mg/dl, then notify this nurse and give pt regular cola. Pt then called out and stated that he had checked his sugar and it was 42. NT in and checked blood sugar with reading of 26 mg/dl. NT gave regular coke and notified this nurse.  Upon my arrival to room at 1758, pt was alert, oriented, skin warm and dry and has drank all of coke soda provided. Pt states, 'I told y'all that I can feel when its dropping. I know my body." MD Dyann Kief notified of current situation, 1 amp D50 brought to pt's room.  1813: Pt's blood sugar rechecked s/p cola and is now 24 mg/dl. 25 gm Dextrose (1 amp) administered per standing order without difficulty. MD Dyann Kief updated on current pt condition. Pt remains alert, oriented and talking with his wife on the phone. Will continue to monitor pt and recheck blood sugar at 1830. 1832: Blood sugar 126 mg/dl s/p D50 administration. Pt and wife both state that there is no re-programming that is done for his insulin pump related to hypoglycemic episodes. Insulin pump interrogated and is currently set at 0.550 units/hr. Pt denies any c/o at present. MD Dyann Kief updated and pt advised to report any additional s/s low blood sugar. States understanding.

## 2020-04-17 NOTE — Progress Notes (Signed)
PROGRESS NOTE    [name]  OHY:[id number] DOB: 08/05/1970 DOA: 04/15/2020 PCP: Sharilyn Sites, MD   Chief Complaint  Patient presents with  . Emesis    Brief Narrative:  As per H&P written by Dr. Josephine Cables on 04/15/2020 Fayette Pho is a 49 y.o. male with medical history significant for obesity, hyperlipidemia, type 2 diabetes mellitus on insulin pump and GERD who presents to the emergency department due to 9-day onset of nausea, vomiting and diarrhea.  Vomiting was approximately 5 episodes daily with vomiting of food contents with no blood.  Diarrhea was several episodes daily and was mainly watery in nature.  He was unable to keep anything down, he saw his PCP on 12/13 and was prescribed with ciprofloxacin due to presumed UTI.  Patient also complained of cough and increasing shortness of breath which started about the same time. He denies any abdominal pain, fever, chills, sick contacts.  Patient was seen on 12/14 in the ED and was noted to be dehydrated with symptoms suspected to be due to viral.  Patient returned to the [facility] ED on 12/16 due to shortness of breath and was diagnosed to have gastroenteritis and was discharged home with antiemetics as needed.  ED Course:  In the emergency department, he was tachycardic and tachypneic.  BP was 130/77 and O2 sat was 93-100% on room air.  Work-up in the ED showed mild leukocytosis, hyperglycemia.  D-dimer 1.45.  Urinalysis was unimpressive for UTI.  Respiratory panel for influenza A, B and SARS coronavirus was negative. CT angiography chest showed no evidence of PE but showed lower lobe bronchial wall thickening compatible with bronchitis with no acute airspace disease. Chest x-ray showed no acute intrathoracic process Breathing treatment was provided, IV Solu-Medrol was given.  Phenergan was given due to nausea and vomiting and IV hydration was provided.  Hospitalist was asked to admit patient for further evaluation and management.  Assessment &  Plan: 1-DKA -Most likely triggered by recent use of steroids -Chronically on insulin pump -DKA has now been resolved and patient off insulin drip -CBGs elevated after transition and Bicarb 21; will attempt insulin pump resumption with assistance by diabetes coordinator. -A1c 7.5 -Continue to follow electrolyte and further replete as needed -Continue to maintain adequate hydration. -Hopefully discharge home tomorrow.   -Modified carbohydrate diet has been ordered.  2-Nausea, vomiting and diarrhea -Reports no further nausea, vomiting or diarrhea since arrival to the ED -Discontinue enteric precaution. -Advised to maintain adequate hydration. -Continue to replete electrolytes as needed.  3-bronchitis -Recently treated with steroids and antibiotics as an outpatient. -Continue to use antitussive medications and the use of bronchodilator management. -Patient has a prior history of tobacco abuse; will benefit of outpatient follow-up with pulmonology for PFTs in case he has an underlying undiagnosed COPD.  4-acute renal failure -In the setting of dehydration from DKA -Continue avoiding nephrotoxic agents -Continue to maintain adequate hydration -Creatinine has improved and essentially back to baseline based on today's labs; will repeat basic metabolic panel in a.m. -Avoid the use of contrast.  5-Tobacco abuse -Cessation counseling provided. -Patient has declined the use of nicotine patch.  6-hyperlipidemia -will continue statins.  7-GERD -continue PPI  8-HTN -stable currently -Holding Cozaar in the setting of recovering acute renal failure. -Hopefully safe to resume on 04/18/2020.   DVT prophylaxis: Lovenox Code Status: Full code Family Communication: Wife updated bedside. Disposition:   Status is: Inpatient  Dispo: The patient is from: Home  Anticipated d/c is to: Home              Anticipated d/c date is: 1 days.              Patient currently no  medically stable for discharge; patient has just been transition off insulin drip; blood sugar starting to climb up.  Will attempt resumption of home insulin pump with assistance from diabetes continue to with further adjustments to his settings to maintain euglycemic state.  Continue IV fluids and replete electrolytes as needed.    Consultants:   None  Procedures:  See below for x-ray reports   Antimicrobials:  None   Subjective: No chest pain, no nausea, no vomiting.  Expressed improvement in his breathing and is currently no wheezing.  Patient has been transition off insulin drip overnight.  Objective: Vitals:   04/17/20 1000 04/17/20 1145 04/17/20 1248 04/17/20 1454  BP: 112/69  114/82 118/79  Pulse: 79  81 79  Resp: 19  18 20   Temp:  97.7 F (36.5 C)  97.8 F (36.6 C)  TempSrc:  Oral  Oral  SpO2: 95%  96% 99%  Weight:      Height:       No intake or output data in the 24 hours ending 04/17/20 1633 Filed Weights   04/15/20 1524 04/17/20 0515  Weight: 83 kg 85 kg    Examination: General exam: Alert, awake, oriented x 3; no chest pain, no nausea, no vomiting.  Patient is afebrile.  Reports feeling much better today.  Expressed no shortness of breath currently.  Still intermittent nonproductive coughing spells. Respiratory system: Clear to auscultation.  No using accessory muscles; today's examination no wheezing or crackles appreciated.  Good O2 sat on room air. Cardiovascular system:RRR. No murmurs, rubs, gallops.  No JVD. Gastrointestinal system: Abdomen is nondistended, soft and nontender. No organomegaly or masses felt. Normal bowel sounds heard. Central nervous system: Alert and oriented. No focal neurological deficits. Extremities: No cyanosis or clubbing. Skin: No rashes, no petechiae. Psychiatry: Judgement and insight appear normal. Mood & affect appropriate.    Data Reviewed: I have personally reviewed following labs and imaging studies  CBC: Recent Labs   Lab 04/12/20 1309 04/15/20 1629 04/16/20 0206  WBC 6.3 11.3* 7.1  NEUTROABS 3.6 9.5*  --   HGB 15.1 15.0 12.4*  HCT 43.6 43.9 38.7*  MCV 88.3 88.5 93.9  PLT 119* 161 148*    Basic Metabolic Panel: Recent Labs  Lab 04/16/20 0206 04/16/20 0746 04/16/20 1219 04/16/20 1824 04/17/20 0502  NA 135 136 139 144 140  K 3.8 3.1* 2.8* 3.7 4.1  CL 102 110 112* 113* 111  CO2 10* 17* 18* 22 21*  GLUCOSE 738* 604* 331* 144* 218*  BUN 23* 25* 23* 22* 23*  CREATININE 1.81* 1.56* 1.34* 1.15 1.10  CALCIUM 7.9* 8.2* 8.4* 8.4* 8.5*  MG 1.9  --   --   --   --   PHOS 5.1*  --   --   --   --     GFR: Estimated Creatinine Clearance: 75.1 mL/min (by C-G formula based on SCr of 1.1 mg/dL).  Liver Function Tests: Recent Labs  Lab 04/12/20 1309 04/15/20 1629 04/16/20 0206  AST 29 19 23   ALT 27 20 24   ALKPHOS 52 50 43  BILITOT 1.0 1.1 0.9  PROT 6.6 7.2 6.3*  ALBUMIN 3.4* 3.8 3.2*    CBG: Recent Labs  Lab 04/16/20 1957 04/16/20 2337 04/17/20 0823 04/17/20  1147 04/17/20 1625  GLUCAP 156* 406* 137* 194* 94    Recent Results (from the past 240 hour(s))  Resp Panel by RT-PCR (Flu A&B, Covid) Nasopharyngeal Swab     Status: None   Collection Time: 04/10/20  2:52 PM   Specimen: Nasopharyngeal Swab; Nasopharyngeal(NP) swabs in vial transport medium  Result Value Ref Range Status   SARS Coronavirus 2 by RT PCR NEGATIVE NEGATIVE Final    Comment: (NOTE) SARS-CoV-2 target nucleic acids are NOT DETECTED.  The SARS-CoV-2 RNA is generally detectable in upper respiratory specimens during the acute phase of infection. The lowest concentration of SARS-CoV-2 viral copies this assay can detect is 138 copies/mL. A negative result does not preclude SARS-Cov-2 infection and should not be used as the sole basis for treatment or other patient management decisions. A negative result may occur with  improper specimen collection/handling, submission of specimen other than nasopharyngeal swab,  presence of viral mutation(s) within the areas targeted by this assay, and inadequate number of viral copies(<138 copies/mL). A negative result must be combined with clinical observations, patient history, and epidemiological information. The expected result is Negative.  Fact Sheet for Patients:  EntrepreneurPulse.com.au  Fact Sheet for Healthcare Providers:  IncredibleEmployment.be  This test is no t yet approved or cleared by the [place] FDA and  has been authorized for detection and/or diagnosis of SARS-CoV-2 by FDA under an Emergency Use Authorization (EUA). This EUA will remain  in effect (meaning this test can be used) for the duration of the COVID-19 declaration under Section 564(b)(1) of the Act, 21 U.S.C.section 360bbb-3(b)(1), unless the authorization is terminated  or revoked sooner.       Influenza A by PCR NEGATIVE NEGATIVE Final   Influenza B by PCR NEGATIVE NEGATIVE Final    Comment: (NOTE) The Xpert Xpress SARS-CoV-2/FLU/RSV plus assay is intended as an aid in the diagnosis of influenza from Nasopharyngeal swab specimens and should not be used as a sole basis for treatment. Nasal washings and aspirates are unacceptable for Xpert Xpress SARS-CoV-2/FLU/RSV testing.  Fact Sheet for Patients: EntrepreneurPulse.com.au  Fact Sheet for Healthcare Providers: IncredibleEmployment.be  This test is not yet approved or cleared by the [place] FDA and has been authorized for detection and/or diagnosis of SARS-CoV-2 by FDA under an Emergency Use Authorization (EUA). This EUA will remain in effect (meaning this test can be used) for the duration of the COVID-19 declaration under Section 564(b)(1) of the Act, 21 U.S.C. section 360bbb-3(b)(1), unless the authorization is terminated or revoked.  Performed at [facility], [address] [place], [place] [address]   Resp Panel by RT-PCR (Flu  A&B, Covid) Nasopharyngeal Swab     Status: None   Collection Time: 04/15/20  4:31 PM   Specimen: Nasopharyngeal Swab; Nasopharyngeal(NP) swabs in vial transport medium  Result Value Ref Range Status   SARS Coronavirus 2 by RT PCR NEGATIVE NEGATIVE Final    Comment: (NOTE) SARS-CoV-2 target nucleic acids are NOT DETECTED.  The SARS-CoV-2 RNA is generally detectable in upper respiratory specimens during the acute phase of infection. The lowest concentration of SARS-CoV-2 viral copies this assay can detect is 138 copies/mL. A negative result does not preclude SARS-Cov-2 infection and should not be used as the sole basis for treatment or other patient management decisions. A negative result may occur with  improper specimen collection/handling, submission of specimen other than nasopharyngeal swab, presence of viral mutation(s) within the areas targeted by this assay, and inadequate number of viral copies(<138 copies/mL). A  negative result must be combined with clinical observations, patient history, and epidemiological information. The expected result is Negative.  Fact Sheet for Patients:  EntrepreneurPulse.com.au  Fact Sheet for Healthcare Providers:  IncredibleEmployment.be  This test is no t yet approved or cleared by the [place] FDA and  has been authorized for detection and/or diagnosis of SARS-CoV-2 by FDA under an Emergency Use Authorization (EUA). This EUA will remain  in effect (meaning this test can be used) for the duration of the COVID-19 declaration under Section 564(b)(1) of the Act, 21 U.S.C.section 360bbb-3(b)(1), unless the authorization is terminated  or revoked sooner.       Influenza A by PCR NEGATIVE NEGATIVE Final   Influenza B by PCR NEGATIVE NEGATIVE Final    Comment: (NOTE) The Xpert Xpress SARS-CoV-2/FLU/RSV plus assay is intended as an aid in the diagnosis of influenza from Nasopharyngeal swab specimens  and should not be used as a sole basis for treatment. Nasal washings and aspirates are unacceptable for Xpert Xpress SARS-CoV-2/FLU/RSV testing.  Fact Sheet for Patients: EntrepreneurPulse.com.au  Fact Sheet for Healthcare Providers: IncredibleEmployment.be  This test is not yet approved or cleared by the [place] FDA and has been authorized for detection and/or diagnosis of SARS-CoV-2 by FDA under an Emergency Use Authorization (EUA). This EUA will remain in effect (meaning this test can be used) for the duration of the COVID-19 declaration under Section 564(b)(1) of the Act, 21 U.S.C. section 360bbb-3(b)(1), unless the authorization is terminated or revoked.  Performed at [facility], [address] [place], [place] [address]   C Difficile Quick Screen w PCR reflex     Status: None   Collection Time: 04/15/20 11:23 PM   Specimen: STOOL  Result Value Ref Range Status   C Diff antigen NEGATIVE NEGATIVE Final   C Diff toxin NEGATIVE NEGATIVE Final   C Diff interpretation No C. difficile detected.  Final    Comment: Performed at [facility], [address], [place], [place] [address]  Gastrointestinal Panel by PCR , Stool     Status: None   Collection Time: 04/15/20 11:23 PM   Specimen: STOOL  Result Value Ref Range Status   Campylobacter species NOT DETECTED NOT DETECTED Final   Plesimonas shigelloides NOT DETECTED NOT DETECTED Final   Salmonella species NOT DETECTED NOT DETECTED Final   Yersinia enterocolitica NOT DETECTED NOT DETECTED Final   Vibrio species NOT DETECTED NOT DETECTED Final   Vibrio cholerae NOT DETECTED NOT DETECTED Final   Enteroaggregative E coli (EAEC) NOT DETECTED NOT DETECTED Final   Enteropathogenic E coli (EPEC) NOT DETECTED NOT DETECTED Final   Enterotoxigenic E coli (ETEC) NOT DETECTED NOT DETECTED Final   Shiga like toxin producing E coli (STEC) NOT DETECTED NOT DETECTED Final   Shigella/Enteroinvasive E  coli (EIEC) NOT DETECTED NOT DETECTED Final   Cryptosporidium NOT DETECTED NOT DETECTED Final   Cyclospora cayetanensis NOT DETECTED NOT DETECTED Final   Entamoeba histolytica NOT DETECTED NOT DETECTED Final   Giardia lamblia NOT DETECTED NOT DETECTED Final   Adenovirus F40/41 NOT DETECTED NOT DETECTED Final   Astrovirus NOT DETECTED NOT DETECTED Final   Norovirus GI/GII NOT DETECTED NOT DETECTED Final   Rotavirus A NOT DETECTED NOT DETECTED Final   Sapovirus (I, II, IV, and V) NOT DETECTED NOT DETECTED Final    Comment: Performed at [facility], [address], [place], [place] [address]  MRSA PCR Screening     Status: None   Collection Time: 04/16/20  1:26 AM   Specimen: Nasal Mucosa; Nasopharyngeal  Result Value Ref Range Status   MRSA by PCR NEGATIVE NEGATIVE Final    Comment:        The GeneXpert MRSA Assay (FDA approved for NASAL specimens only), is one component of a comprehensive MRSA colonization surveillance program. It is not intended to diagnose MRSA infection nor to guide or monitor treatment for MRSA infections. Performed at [facility], [address], [place], [place] [address]     Radiology Studies: CT Angio Chest PE W and/or Wo Contrast  Result Date: 04/15/2020 CLINICAL DATA:  Dyspnea on exertion, tachycardia, nausea and vomiting, positive D dimer EXAM: CT ANGIOGRAPHY CHEST WITH CONTRAST TECHNIQUE: Multidetector CT imaging of the chest was performed using the standard protocol during bolus administration of intravenous contrast. Multiplanar CT image reconstructions and MIPs were obtained to evaluate the vascular anatomy. CONTRAST:  16mL OMNIPAQUE IOHEXOL 350 MG/ML SOLN COMPARISON:  03/16/2017, 04/15/2020 FINDINGS: Cardiovascular: This is a technically adequate evaluation of the pulmonary vasculature. No filling defects or pulmonary emboli. The heart is unremarkable without pericardial effusion. Normal appearance of the thoracic aorta. Mediastinum/Nodes:  Borderline enlarged mediastinal lymph nodes are stable and likely benign. No pathologic adenopathy. Thyroid, trachea, and esophagus are unremarkable. Lungs/Pleura: No airspace disease, effusion, or pneumothorax. There is mild bronchial wall thickening most pronounced in the bilateral lower lobes. Central airways are patent. Upper Abdomen: No acute abnormality. Musculoskeletal: No acute or destructive bony lesions. Reconstructed images demonstrate no additional findings. Review of the MIP images confirms the above findings. IMPRESSION: 1. No evidence of pulmonary embolus. 2. Lower lobe bronchial wall thickening compatible with bronchitis. No acute airspace disease. Electronically Signed   By: Randa Ngo M.D.   On: 04/15/2020 23:16   DG Chest Portable 1 View  Result Date: 04/15/2020 CLINICAL DATA:  Short of breath, cough, emesis EXAM: PORTABLE CHEST 1 VIEW COMPARISON:  04/12/2020 FINDINGS: Single frontal view of the chest demonstrates an unremarkable cardiac silhouette. Background emphysema without airspace disease, effusion, or pneumothorax. No acute bony abnormalities. IMPRESSION: 1. No acute intrathoracic process. Electronically Signed   By: Randa Ngo M.D.   On: 04/15/2020 16:57    Scheduled Meds: . Chlorhexidine Gluconate Cloth  6 each Topical Daily  . dextromethorphan-guaiFENesin  1 tablet Oral BID  . enoxaparin (LOVENOX) injection  40 mg Subcutaneous Q24H  . insulin aspart  0-5 Units Subcutaneous QHS  . insulin aspart  0-9 Units Subcutaneous TID WC  . insulin glargine  10 Units Subcutaneous Q24H  . insulin pump   Subcutaneous TID WC, HS, 0200  . pantoprazole  40 mg Oral Daily  . simvastatin  40 mg Oral Daily   Continuous Infusions: . sodium chloride 75 mL/hr at 04/17/20 0841  . lactated ringers 125 mL/hr at 04/16/20 0954     LOS: 2 days    Time spent: 30 minutes  Barton Dubois, MD [facility]   To contact the attending provider between 7A-7P or the covering  provider during after hours 7P-7A, please log into the web site [email or web address] and access using universal [place] password for that web site. If you do not have the password, please call the hospital operator.  04/17/2020, 4:33 PM

## 2020-04-17 NOTE — Progress Notes (Signed)
Pt CBG: 37. Pt is alert and oriented at this time. Asymptomatic.  Mid-level made aware via [place] chat.

## 2020-04-17 NOTE — Progress Notes (Signed)
Pt with CBG of 406. He has had his lantus and insulin drip turned off 2hrs prior. APP Sharlet Salina made aware.

## 2020-04-17 NOTE — Progress Notes (Addendum)
Inpatient Diabetes Program Recommendations  AACE/ADA: New Consensus Statement on Inpatient Glycemic Control (2015)  Target Ranges:  Prepandial:   less than 140 mg/dL      Peak postprandial:   less than 180 mg/dL (1-2 hours)      Critically ill patients:  140 - 180 mg/dL  Results for Brandon, Vazquez (MRN [id number]) as of 04/17/2020 07:23  Ref. Range 04/17/2020 05:02  Glucose Latest Ref Range: 70 - 99 mg/dL 218 (H)   Results for Brandon, Vazquez (MRN [id number]) as of 04/17/2020 07:23  Ref. Range 04/16/2020 06:23 04/16/2020 07:07 04/16/2020 08:19 04/16/2020 09:18 04/16/2020 09:53 04/16/2020 10:31 04/16/2020 11:35 04/16/2020 12:58 04/16/2020 14:06 04/16/2020 15:11 04/16/2020 16:33 04/16/2020 18:47 04/16/2020 19:57 04/16/2020 23:37  Glucose-Capillary Latest Ref Range: 70 - 99 mg/dL >600 (HH) 548 (HH) 482 (H) 483 (H) 442 (H) 371 (H) 339 (H) 273 (H) 220 (H) 166 (H) 144 (H) 150 (H) 156 (H) 406 (H)   Review of Glycemic Control  Diabetes history: DM1 (makes NO insulin; requires basal, correction, and meal coverage insulin) Outpatient Diabetes medications: Medtronic insulin pump with Novolog Current orders for Inpatient glycemic control: Lantus 10 units Q24H, Novolog 0-9 units TID with meals, Novolog 0-5 units QHS  Inpatient Diabetes Program Recommendations:    Insulin Pump: Per conversation with Dr. Dyann Kief, patient will be resuming his insulin pump. NURSING: Please ensure that patient removed infusion site that was on prior to admission and that patient inserts a new infusion site prior to restarting his insulin pump.   NOTE: Diabetes Coordinator spoke with patient and patient's Endocrinologist office on 04/16/20 and insulin pump settings were noted in the chart. Patient was asked to go ahead and remove his infusion site as he will need to insert a new infusion site prior to resuming his insulin pump.  Patient received Lantus 10 units at 21:38 on 04/16/20 and glucose was up to 406 mg/dl at 23:37 on  04/16/20 which was treated with Novolog 5 units. Lab glucose this morning 218 mg/dl at 5:02 am. Anticipate patient did not receive enough basal insulin and no meal coverage was ordered which resulted in glucose increasing again after IV insulin was stopped.  Communicated with Dr. Dyann Kief and he plans to have patient resume his insulin pump today.    NURSING: Once insulin pump order set is ordered please print off the Patient insulin pump contract and flow sheet. The insulin pump contract should be signed by the patient and then placed in the chart. The patient insulin pump flow sheet will be completed by the patient at the bedside and the RN caring for the patient will use the patient's flow sheet to document in the [facility]. RN will need to complete the Nursing Insulin Pump Flowsheet at least once a shift. Patient will need to keep extra insulin pump supplies at the bedside at all times.  Addendum 04/17/20@12 :14: Communicated with Stanton Kidney, RN regarding patient restarting insulin pump. RN reports patient restarted his insulin pump and that he was adamant about not changing infusion site. Therefore, he restarted his insulin pump using his old infusion site which could likely be the cause of why he went into DKA (site placed on 04/15/20 per note by Bethena Roys, RN, Diabetes coordinator on 04/16/20).  Stanton Kidney, RN reports Dr. Dyann Kief is aware that patient restarted pump using old infusion site and she will monitor glucose closely. Concerned about patient restarting pump using old infusion site as the site could be kinked which lead to DKA initially.   Thanks,  Barnie Alderman, RN, MSN, CDE Diabetes Coordinator Inpatient Diabetes Program [phone] (Team Pager from 8am to 5pm)

## 2020-04-17 NOTE — Progress Notes (Signed)
Nurse stressed the importance of changing the injection site of insulin pump and why it's important to change the site and pt is still very adamant at not changing it until tomorrow; MD and DM coordinator made aware.

## 2020-04-17 NOTE — Progress Notes (Signed)
Pt in room # 301. Denies c/o at present. States insulin pump is on and set to temporary basal rate like he has at home when he is not active. VSS. Wife at bedside. Discussed plan of care, oriented to room and safety procedures. States understanding.

## 2020-04-17 NOTE — Progress Notes (Signed)
Patient CBG: 52. Patient he wanted orange juice and had graham crackers and peanut butter at bedside.

## 2020-04-18 LAB — GLUCOSE, CAPILLARY
Glucose-Capillary: 50 mg/dL — ABNORMAL LOW (ref 70–99)
Glucose-Capillary: 69 mg/dL — ABNORMAL LOW (ref 70–99)
Glucose-Capillary: 96 mg/dL (ref 70–99)
Glucose-Capillary: 34 mg/dL — CL (ref 70–99)
Glucose-Capillary: 42 mg/dL — CL (ref 70–99)
Glucose-Capillary: 129 mg/dL — ABNORMAL HIGH (ref 70–99)
Glucose-Capillary: 143 mg/dL — ABNORMAL HIGH (ref 70–99)
Glucose-Capillary: 205 mg/dL — ABNORMAL HIGH (ref 70–99)

## 2020-04-18 LAB — BASIC METABOLIC PANEL
Anion gap: 6 (ref 5–15)
BUN: 21 mg/dL — ABNORMAL HIGH (ref 6–20)
CO2: 23 mmol/L (ref 22–32)
Calcium: 8.3 mg/dL — ABNORMAL LOW (ref 8.9–10.3)
Chloride: 112 mmol/L — ABNORMAL HIGH (ref 98–111)
Creatinine, Ser: 1.11 mg/dL (ref 0.61–1.24)
GFR, Estimated: >60 mL/min (ref >60)
Glucose, Bld: 74 mg/dL (ref 70–99)
Potassium: 3.4 mmol/L — ABNORMAL LOW (ref 3.5–5.1)
Sodium: 141 mmol/L (ref 135–145)

## 2020-04-18 MED ORDER — POTASSIUM CHLORIDE CRYS ER 20 MEQ PO TBCR
40.0000 meq | EXTENDED_RELEASE_TABLET | Freq: Once | ORAL | Status: AC
  Administered 2020-04-18: 10:00:00 40 meq via ORAL
  Filled 2020-04-18: qty 2

## 2020-04-18 NOTE — Progress Notes (Signed)
Patient escorted to front entrance of hospital via [facility] by NT. Patient released to care of wife.

## 2020-04-18 NOTE — Progress Notes (Signed)
Reviewed Discharge instructions with Patient who verbalized understanding. IV site to right arm and left arm removed sites WNL. Patient will notify nursing staff when his ride arrives.

## 2020-04-18 NOTE — Progress Notes (Signed)
Pt states MD has been in to see him and told him that he would be discharged today. Pt states MD Manuella Ghazi told him he could restart his insulin pump and only give 50% carb overage for his lunch meal. This was verified with MD Manuella Ghazi. Pt denies c/o at present, sipping on diet soda.

## 2020-04-18 NOTE — Discharge Summary (Signed)
Physician Discharge Summary  [name] [id number] DOB: 1970/08/31 DOA: 04/15/2020  PCP: Sharilyn Sites, MD  Admit date: 04/15/2020  Discharge date: 04/18/2020  Admitted From:Home  Disposition:  Home  Recommendations for Outpatient Follow-up:  1. Follow up with PCP in 1-2 weeks 2. Follow-up with endocrinology on 05/04/2020 as scheduled 3. Continue insulin pump as per previous as well as prior diet 4. Continue other home medications as noted below  Home Health: None  Equipment/Devices: None, has home insulin pump  Discharge Condition:Stable  CODE STATUS: Full  Diet recommendation: Heart Healthy/carb modified  Brief/Interim Summary: As per H&P written by Dr. Josephine Cables on 04/15/2020 [name] a 49 y.o.malewith medical history significant forobesity, hyperlipidemia, type 2 diabetes mellitus on insulin pump and GERD who presents to the emergency department due to 9-day onset of nausea, vomiting and diarrhea. Vomiting was approximately 5 episodes daily with vomiting of food contents with no blood. Diarrhea was several episodes daily and was mainly watery in nature. He was unable to keep anything down, he saw his PCP on 12/13 and was prescribed with ciprofloxacin due to presumed UTI. Patient also complained of cough and increasing shortness of breath which started about the same time. He denies any abdominal pain, fever, chills, sick contacts. Patient was seen on 12/14 inthe ED and was noted to be dehydrated with symptoms suspected to be due to viral. [facility] due to shortness of breath and was diagnosed to have gastroenteritis and was discharged home with antiemetics as needed.  ED Course: In the emergency department,he was tachycardicand tachypneic. BP was 130/77 and O2 sat was 93-100% on room air. Work-up in the ED showed mild leukocytosis, hyperglycemia. D-dimer 1.45. Urinalysis was unimpressive for UTI. Respiratory panel for  influenza A, B and SARS coronavirus was negative. CT angiography chest showed no evidence of PE but showed lower lobe bronchial wall thickening compatible with bronchitis with no acute airspace disease. Chest x-ray showed no acute intrathoracic process Breathing treatment was provided, IV Solu-Medrol was given. Phenergan was given due to nausea and vomiting and IV hydration was provided. Hospitalist was asked to admit patient for further evaluation and management.  -Patient was admitted with DKA triggered by recent use of steroids for his bronchitis.  He was noted to have symptoms of nausea vomiting and diarrhea.  His DKA has now resolved after use of IV fluid and IV insulin drip.  He is now tolerating his diet, but has had some hypoglycemic events noted in the last 1-2 days with diet in the hospital setting.  Unfortunately his diet has at home is comprised of all kinds of processed snack foods which keeps his blood glucose quite elevated.  His insulin pump is set for these parameters and he knows how to adjust his pump as needed based on his carbohydrate intake.  He is currently in stable condition this morning for discharge and has had no further symptomatology and is tolerating diet.  He has close follow-up with endocrinology noted in the next 2 weeks.  Discharge Diagnoses:  Principal Problem:   Nausea, vomiting and diarrhea Active Problems:   Tobacco abuse   Acute bronchitis   DM type 2 (diabetes mellitus, type 2) (HCC)   Dehydration   Hyperlipidemia   Prolonged QT interval  Principal discharge diagnosis: DKA in the setting of steroid use.  Discharge Instructions  Discharge Instructions    Diet - low sodium heart healthy   Complete by: As directed  Increase activity slowly   Complete by: As directed      Allergies as of 04/18/2020      Reactions   Codeine Nausea Only   Drowsiness   Glucophage [metformin Hydrochloride] Other (See Comments)   Vomiting and cramps       Medication List    STOP taking these medications   ciprofloxacin 500 MG tablet Commonly known as: CIPRO     TAKE these medications   benzonatate 200 MG capsule Commonly known as: TESSALON Take 200 mg by mouth 3 (three) times daily as needed for cough.   CENTRUM PO Take 1 tablet by mouth daily.   diphenoxylate-atropine 2.5-0.025 MG tablet Commonly known as: LOMOTIL Take 1 tablet by mouth every 4 (four) hours as needed.   losartan 100 MG tablet Commonly known as: COZAAR Take 100 mg daily by mouth.   NovoLOG 100 UNIT/ML injection Generic drug: insulin aspart Inject 30 Units into the skin daily. In insulin pump   ondansetron 4 MG disintegrating tablet Commonly known as: Zofran ODT Take 1 tablet (4 mg total) by mouth every 8 (eight) hours as needed for nausea or vomiting.   pantoprazole 40 MG tablet Commonly known as: PROTONIX Take 40 mg daily by mouth.   simvastatin 40 MG tablet Commonly known as: ZOCOR Take 40 mg by mouth daily.       Follow-up Information    [facility] Follow up on 04/23/2020.   Specialty: Family Medicine Why: You are scheduled for a hospital follow up with Rowan Blase, PA on Monday 04/23/20 at 10:45am.  Please arrive at 10:15 to check in.  Contact information: [place] [address] [phone]              Allergies  Allergen Reactions  . Codeine Nausea Only    Drowsiness  . Glucophage [Metformin Hydrochloride] Other (See Comments)    Vomiting and cramps    Consultations:  None   Procedures/Studies: DG Chest 2 View  Result Date: 04/12/2020 CLINICAL DATA:  Short of breath EXAM: CHEST - 2 VIEW COMPARISON:  04/10/2020 FINDINGS: The heart size and mediastinal contours are within normal limits. Both lungs are clear. The visualized skeletal structures are unremarkable. IMPRESSION: No active cardiopulmonary disease. Electronically Signed   By: Franchot Gallo M.D.   On: 04/12/2020 13:33    CT Angio Chest PE W and/or Wo Contrast  Result Date: 04/15/2020 CLINICAL DATA:  Dyspnea on exertion, tachycardia, nausea and vomiting, positive D dimer EXAM: CT ANGIOGRAPHY CHEST WITH CONTRAST TECHNIQUE: Multidetector CT imaging of the chest was performed using the standard protocol during bolus administration of intravenous contrast. Multiplanar CT image reconstructions and MIPs were obtained to evaluate the vascular anatomy. CONTRAST:  147mL OMNIPAQUE IOHEXOL 350 MG/ML SOLN COMPARISON:  03/16/2017, 04/15/2020 FINDINGS: Cardiovascular: This is a technically adequate evaluation of the pulmonary vasculature. No filling defects or pulmonary emboli. The heart is unremarkable without pericardial effusion. Normal appearance of the thoracic aorta. Mediastinum/Nodes: Borderline enlarged mediastinal lymph nodes are stable and likely benign. No pathologic adenopathy. Thyroid, trachea, and esophagus are unremarkable. Lungs/Pleura: No airspace disease, effusion, or pneumothorax. There is mild bronchial wall thickening most pronounced in the bilateral lower lobes. Central airways are patent. Upper Abdomen: No acute abnormality. Musculoskeletal: No acute or destructive bony lesions. Reconstructed images demonstrate no additional findings. Review of the MIP images confirms the above findings. IMPRESSION: 1. No evidence of pulmonary embolus. 2. Lower lobe bronchial wall thickening compatible with bronchitis. No acute  airspace disease. Electronically Signed   By: Randa Ngo M.D.   On: 04/15/2020 23:16   DG Chest Portable 1 View  Result Date: 04/15/2020 CLINICAL DATA:  Short of breath, cough, emesis EXAM: PORTABLE CHEST 1 VIEW COMPARISON:  04/12/2020 FINDINGS: Single frontal view of the chest demonstrates an unremarkable cardiac silhouette. Background emphysema without airspace disease, effusion, or pneumothorax. No acute bony abnormalities. IMPRESSION: 1. No acute intrathoracic process. Electronically Signed   By:  Randa Ngo M.D.   On: 04/15/2020 16:57   DG Chest Portable 1 View  Result Date: 04/10/2020 CLINICAL DATA:  Cough. EXAM: PORTABLE CHEST 1 VIEW COMPARISON:  CT chest and chest x-ray dated March 16, 2017. FINDINGS: The heart size and mediastinal contours are within normal limits. Both lungs are clear. The visualized skeletal structures are unremarkable. IMPRESSION: No active disease. Electronically Signed   By: Titus Dubin M.D.   On: 04/10/2020 14:32      Discharge Exam: Vitals:   04/18/20 0206 04/18/20 0408  BP: 128/84 124/77  Pulse: 81 93  Resp: 20 19  Temp: 99 F (37.2 C) 98 F (36.7 C)  SpO2: 95% 97%   Vitals:   04/17/20 1741 04/17/20 2003 04/18/20 0206 04/18/20 0408  BP: 115/74 112/81 128/84 124/77  Pulse: 78 81 81 93  Resp: 18 18 20 19   Temp: 97.6 F (36.4 C) 98.4 F (36.9 C) 99 F (37.2 C) 98 F (36.7 C)  TempSrc: Oral  Oral   SpO2: 97% 99% 95% 97%  Weight:      Height:        General: Pt is alert, awake, not in acute distress Cardiovascular: RRR, S1/S2 +, no rubs, no gallops Respiratory: CTA bilaterally, no wheezing, no rhonchi Abdominal: Soft, NT, ND, bowel sounds + Extremities: no edema, no cyanosis    The results of significant diagnostics from this hospitalization (including imaging, microbiology, ancillary and laboratory) are listed below for reference.     Microbiology: Recent Results (from the past 240 hour(s))  Resp Panel by RT-PCR (Flu A&B, Covid) Nasopharyngeal Swab     Status: None   Collection Time: 04/10/20  2:52 PM   Specimen: Nasopharyngeal Swab; Nasopharyngeal(NP) swabs in vial transport medium  Result Value Ref Range Status   SARS Coronavirus 2 by RT PCR NEGATIVE NEGATIVE Final    Comment: (NOTE) SARS-CoV-2 target nucleic acids are NOT DETECTED.  The SARS-CoV-2 RNA is generally detectable in upper respiratory specimens during the acute phase of infection. The lowest concentration of SARS-CoV-2 viral copies this assay can  detect is 138 copies/mL. A negative result does not preclude SARS-Cov-2 infection and should not be used as the sole basis for treatment or other patient management decisions. A negative result may occur with  improper specimen collection/handling, submission of specimen other than nasopharyngeal swab, presence of viral mutation(s) within the areas targeted by this assay, and inadequate number of viral copies(<138 copies/mL). A negative result must be combined with clinical observations, patient history, and epidemiological information. The expected result is Negative.  Fact Sheet for Patients:  EntrepreneurPulse.com.au  Fact Sheet for Healthcare Providers:  IncredibleEmployment.be  This test is no t yet approved or cleared by the [place] FDA and  has been authorized for detection and/or diagnosis of SARS-CoV-2 by FDA under an Emergency Use Authorization (EUA). This EUA will remain  in effect (meaning this test can be used) for the duration of the COVID-19 declaration under Section 564(b)(1) of the Act, 21 U.S.C.section 360bbb-3(b)(1), unless the authorization is  terminated  or revoked sooner.       Influenza A by PCR NEGATIVE NEGATIVE Final   Influenza B by PCR NEGATIVE NEGATIVE Final    Comment: (NOTE) The Xpert Xpress SARS-CoV-2/FLU/RSV plus assay is intended as an aid in the diagnosis of influenza from Nasopharyngeal swab specimens and should not be used as a sole basis for treatment. Nasal washings and aspirates are unacceptable for Xpert Xpress SARS-CoV-2/FLU/RSV testing.  Fact Sheet for Patients: EntrepreneurPulse.com.au  Fact Sheet for Healthcare Providers: IncredibleEmployment.be  This test is not yet approved or cleared by the [place] FDA and has been authorized for detection and/or diagnosis of SARS-CoV-2 by FDA under an Emergency Use Authorization (EUA). This EUA will remain in  effect (meaning this test can be used) for the duration of the COVID-19 declaration under Section 564(b)(1) of the Act, 21 U.S.C. section 360bbb-3(b)(1), unless the authorization is terminated or revoked.  Performed at [facility], [address], [place], [place] [address]   Resp Panel by RT-PCR (Flu A&B, Covid) Nasopharyngeal Swab     Status: None   Collection Time: 04/15/20  4:31 PM   Specimen: Nasopharyngeal Swab; Nasopharyngeal(NP) swabs in vial transport medium  Result Value Ref Range Status   SARS Coronavirus 2 by RT PCR NEGATIVE NEGATIVE Final    Comment: (NOTE) SARS-CoV-2 target nucleic acids are NOT DETECTED.  The SARS-CoV-2 RNA is generally detectable in upper respiratory specimens during the acute phase of infection. The lowest concentration of SARS-CoV-2 viral copies this assay can detect is 138 copies/mL. A negative result does not preclude SARS-Cov-2 infection and should not be used as the sole basis for treatment or other patient management decisions. A negative result may occur with  improper specimen collection/handling, submission of specimen other than nasopharyngeal swab, presence of viral mutation(s) within the areas targeted by this assay, and inadequate number of viral copies(<138 copies/mL). A negative result must be combined with clinical observations, patient history, and epidemiological information. The expected result is Negative.  Fact Sheet for Patients:  EntrepreneurPulse.com.au  Fact Sheet for Healthcare Providers:  IncredibleEmployment.be  This test is no t yet approved or cleared by the [place] FDA and  has been authorized for detection and/or diagnosis of SARS-CoV-2 by FDA under an Emergency Use Authorization (EUA). This EUA will remain  in effect (meaning this test can be used) for the duration of the COVID-19 declaration under Section 564(b)(1) of the Act, 21 U.S.C.section 360bbb-3(b)(1), unless the  authorization is terminated  or revoked sooner.       Influenza A by PCR NEGATIVE NEGATIVE Final   Influenza B by PCR NEGATIVE NEGATIVE Final    Comment: (NOTE) The Xpert Xpress SARS-CoV-2/FLU/RSV plus assay is intended as an aid in the diagnosis of influenza from Nasopharyngeal swab specimens and should not be used as a sole basis for treatment. Nasal washings and aspirates are unacceptable for Xpert Xpress SARS-CoV-2/FLU/RSV testing.  Fact Sheet for Patients: EntrepreneurPulse.com.au  Fact Sheet for Healthcare Providers: IncredibleEmployment.be  This test is not yet approved or cleared by the [place] FDA and has been authorized for detection and/or diagnosis of SARS-CoV-2 by FDA under an Emergency Use Authorization (EUA). This EUA will remain in effect (meaning this test can be used) for the duration of the COVID-19 declaration under Section 564(b)(1) of the Act, 21 U.S.C. section 360bbb-3(b)(1), unless the authorization is terminated or revoked.  Performed at [facility], [address], [place], [place] [address]   C Difficile Quick Screen w PCR  reflex     Status: None   Collection Time: 04/15/20 11:23 PM   Specimen: STOOL  Result Value Ref Range Status   C Diff antigen NEGATIVE NEGATIVE Final   C Diff toxin NEGATIVE NEGATIVE Final   C Diff interpretation No C. difficile detected.  Final    Comment: Performed at [facility], [address], [place], [place] [address]  Gastrointestinal Panel by PCR , Stool     Status: None   Collection Time: 04/15/20 11:23 PM   Specimen: STOOL  Result Value Ref Range Status   Campylobacter species NOT DETECTED NOT DETECTED Final   Plesimonas shigelloides NOT DETECTED NOT DETECTED Final   Salmonella species NOT DETECTED NOT DETECTED Final   Yersinia enterocolitica NOT DETECTED NOT DETECTED Final   Vibrio species NOT DETECTED NOT DETECTED Final   Vibrio cholerae NOT DETECTED NOT DETECTED Final    Enteroaggregative E coli (EAEC) NOT DETECTED NOT DETECTED Final   Enteropathogenic E coli (EPEC) NOT DETECTED NOT DETECTED Final   Enterotoxigenic E coli (ETEC) NOT DETECTED NOT DETECTED Final   Shiga like toxin producing E coli (STEC) NOT DETECTED NOT DETECTED Final   Shigella/Enteroinvasive E coli (EIEC) NOT DETECTED NOT DETECTED Final   Cryptosporidium NOT DETECTED NOT DETECTED Final   Cyclospora cayetanensis NOT DETECTED NOT DETECTED Final   Entamoeba histolytica NOT DETECTED NOT DETECTED Final   Giardia lamblia NOT DETECTED NOT DETECTED Final   Adenovirus F40/41 NOT DETECTED NOT DETECTED Final   Astrovirus NOT DETECTED NOT DETECTED Final   Norovirus GI/GII NOT DETECTED NOT DETECTED Final   Rotavirus A NOT DETECTED NOT DETECTED Final   Sapovirus (I, II, IV, and V) NOT DETECTED NOT DETECTED Final    Comment: Performed at [facility], [address] [place], [place] [address]  MRSA PCR Screening     Status: None   Collection Time: 04/16/20  1:26 AM   Specimen: Nasal Mucosa; Nasopharyngeal  Result Value Ref Range Status   MRSA by PCR NEGATIVE NEGATIVE Final    Comment:        The GeneXpert MRSA Assay (FDA approved for NASAL specimens only), is one component of a comprehensive MRSA colonization surveillance program. It is not intended to diagnose MRSA infection nor to guide or monitor treatment for MRSA infections. Performed at [facility], [address] [place], [place] [address]      Labs: BNP (last 3 results) No results for input(s): BNP in the last 8760 hours. Basic Metabolic Panel: Recent Labs  Lab 04/16/20 0206 04/16/20 0746 04/16/20 1219 04/16/20 1824 04/17/20 0502 04/18/20 0503  NA 135 136 139 144 140 141  K 3.8 3.1* 2.8* 3.7 4.1 3.4*  CL 102 110 112* 113* 111 112*  CO2 10* 17* 18* 22 21* 23  GLUCOSE 738* 604* 331* 144* 218* 74  BUN 23* 25* 23* 22* 23* 21*  CREATININE 1.81* 1.56* 1.34* 1.15 1.10 1.11  CALCIUM 7.9* 8.2* 8.4* 8.4* 8.5* 8.3*   MG 1.9  --   --   --   --   --   PHOS 5.1*  --   --   --   --   --    Liver Function Tests: Recent Labs  Lab 04/12/20 1309 04/15/20 1629 04/16/20 0206  AST 29 19 23   ALT 27 20 24   ALKPHOS 52 50 43  BILITOT 1.0 1.1 0.9  PROT 6.6 7.2 6.3*  ALBUMIN 3.4* 3.8 3.2*   Recent Labs  Lab 04/15/20 1629  LIPASE  22   No results for input(s): AMMONIA in the last 168 hours. CBC: Recent Labs  Lab 04/12/20 1309 04/15/20 1629 04/16/20 0206  WBC 6.3 11.3* 7.1  NEUTROABS 3.6 9.5*  --   HGB 15.1 15.0 12.4*  HCT 43.6 43.9 38.7*  MCV 88.3 88.5 93.9  PLT 119* 161 148*   Cardiac Enzymes: No results for input(s): CKTOTAL, CKMB, CKMBINDEX, TROPONINI in the last 168 hours. BNP: Invalid input(s): POCBNP CBG: Recent Labs  Lab 04/18/20 0732 04/18/20 0804 04/18/20 0834 04/18/20 0946 04/18/20 1058  GLUCAP 34* 42* 129* 143* 205*   D-Dimer Recent Labs    04/15/20 1629  DDIMER 1.45*   Hgb A1c Recent Labs    04/16/20 0206  HGBA1C 7.5*   Lipid Profile No results for input(s): CHOL, HDL, LDLCALC, TRIG, CHOLHDL, LDLDIRECT in the last 72 hours. Thyroid function studies No results for input(s): TSH, T4TOTAL, T3FREE, THYROIDAB in the last 72 hours.  Invalid input(s): FREET3 Anemia work up No results for input(s): VITAMINB12, FOLATE, FERRITIN, TIBC, IRON, RETICCTPCT in the last 72 hours. Urinalysis    Component Value Date/Time   COLORURINE AMBER (A) 04/15/2020 1629   APPEARANCEUR HAZY (A) 04/15/2020 1629   LABSPEC 1.025 04/15/2020 1629   PHURINE 6.0 04/15/2020 1629   GLUCOSEU NEGATIVE 04/15/2020 1629   HGBUR SMALL (A) 04/15/2020 1629   BILIRUBINUR NEGATIVE 04/15/2020 1629   KETONESUR 80 (A) 04/15/2020 1629   PROTEINUR 100 (A) 04/15/2020 1629   UROBILINOGEN 0.2 02/17/2015 2340   NITRITE NEGATIVE 04/15/2020 1629   LEUKOCYTESUR NEGATIVE 04/15/2020 1629   Sepsis Labs Invalid input(s): PROCALCITONIN,  WBC,  LACTICIDVEN Microbiology Recent Results (from the past 240 hour(s))   Resp Panel by RT-PCR (Flu A&B, Covid) Nasopharyngeal Swab     Status: None   Collection Time: 04/10/20  2:52 PM   Specimen: Nasopharyngeal Swab; Nasopharyngeal(NP) swabs in vial transport medium  Result Value Ref Range Status   SARS Coronavirus 2 by RT PCR NEGATIVE NEGATIVE Final    Comment: (NOTE) SARS-CoV-2 target nucleic acids are NOT DETECTED.  The SARS-CoV-2 RNA is generally detectable in upper respiratory specimens during the acute phase of infection. The lowest concentration of SARS-CoV-2 viral copies this assay can detect is 138 copies/mL. A negative result does not preclude SARS-Cov-2 infection and should not be used as the sole basis for treatment or other patient management decisions. A negative result may occur with  improper specimen collection/handling, submission of specimen other than nasopharyngeal swab, presence of viral mutation(s) within the areas targeted by this assay, and inadequate number of viral copies(<138 copies/mL). A negative result must be combined with clinical observations, patient history, and epidemiological information. The expected result is Negative.  Fact Sheet for Patients:  EntrepreneurPulse.com.au  Fact Sheet for Healthcare Providers:  IncredibleEmployment.be  This test is no t yet approved or cleared by the [place] FDA and  has been authorized for detection and/or diagnosis of SARS-CoV-2 by FDA under an Emergency Use Authorization (EUA). This EUA will remain  in effect (meaning this test can be used) for the duration of the COVID-19 declaration under Section 564(b)(1) of the Act, 21 U.S.C.section 360bbb-3(b)(1), unless the authorization is terminated  or revoked sooner.       Influenza A by PCR NEGATIVE NEGATIVE Final   Influenza B by PCR NEGATIVE NEGATIVE Final    Comment: (NOTE) The Xpert Xpress SARS-CoV-2/FLU/RSV plus assay is intended as an aid in the diagnosis of influenza from  Nasopharyngeal swab specimens and should not be used  as a sole basis for treatment. Nasal washings and aspirates are unacceptable for Xpert Xpress SARS-CoV-2/FLU/RSV testing.  Fact Sheet for Patients: EntrepreneurPulse.com.au  Fact Sheet for Healthcare Providers: IncredibleEmployment.be  This test is not yet approved or cleared by the [place] FDA and has been authorized for detection and/or diagnosis of SARS-CoV-2 by FDA under an Emergency Use Authorization (EUA). This EUA will remain in effect (meaning this test can be used) for the duration of the COVID-19 declaration under Section 564(b)(1) of the Act, 21 U.S.C. section 360bbb-3(b)(1), unless the authorization is terminated or revoked.  Performed at [facility], [address] [place], [place] [address]   Resp Panel by RT-PCR (Flu A&B, Covid) Nasopharyngeal Swab     Status: None   Collection Time: 04/15/20  4:31 PM   Specimen: Nasopharyngeal Swab; Nasopharyngeal(NP) swabs in vial transport medium  Result Value Ref Range Status   SARS Coronavirus 2 by RT PCR NEGATIVE NEGATIVE Final    Comment: (NOTE) SARS-CoV-2 target nucleic acids are NOT DETECTED.  The SARS-CoV-2 RNA is generally detectable in upper respiratory specimens during the acute phase of infection. The lowest concentration of SARS-CoV-2 viral copies this assay can detect is 138 copies/mL. A negative result does not preclude SARS-Cov-2 infection and should not be used as the sole basis for treatment or other patient management decisions. A negative result may occur with  improper specimen collection/handling, submission of specimen other than nasopharyngeal swab, presence of viral mutation(s) within the areas targeted by this assay, and inadequate number of viral copies(<138 copies/mL). A negative result must be combined with clinical observations, patient history, and epidemiological information. The expected result is  Negative.  Fact Sheet for Patients:  EntrepreneurPulse.com.au  Fact Sheet for Healthcare Providers:  IncredibleEmployment.be  This test is no t yet approved or cleared by the [place] FDA and  has been authorized for detection and/or diagnosis of SARS-CoV-2 by FDA under an Emergency Use Authorization (EUA). This EUA will remain  in effect (meaning this test can be used) for the duration of the COVID-19 declaration under Section 564(b)(1) of the Act, 21 U.S.C.section 360bbb-3(b)(1), unless the authorization is terminated  or revoked sooner.       Influenza A by PCR NEGATIVE NEGATIVE Final   Influenza B by PCR NEGATIVE NEGATIVE Final    Comment: (NOTE) The Xpert Xpress SARS-CoV-2/FLU/RSV plus assay is intended as an aid in the diagnosis of influenza from Nasopharyngeal swab specimens and should not be used as a sole basis for treatment. Nasal washings and aspirates are unacceptable for Xpert Xpress SARS-CoV-2/FLU/RSV testing.  Fact Sheet for Patients: EntrepreneurPulse.com.au  Fact Sheet for Healthcare Providers: IncredibleEmployment.be  This test is not yet approved or cleared by the [place] FDA and has been authorized for detection and/or diagnosis of SARS-CoV-2 by FDA under an Emergency Use Authorization (EUA). This EUA will remain in effect (meaning this test can be used) for the duration of the COVID-19 declaration under Section 564(b)(1) of the Act, 21 U.S.C. section 360bbb-3(b)(1), unless the authorization is terminated or revoked.  Performed at [facility], [address] [place], [place] [address]   C Difficile Quick Screen w PCR reflex     Status: None   Collection Time: 04/15/20 11:23 PM   Specimen: STOOL  Result Value Ref Range Status   C Diff antigen NEGATIVE NEGATIVE Final   C Diff toxin NEGATIVE NEGATIVE Final   C Diff interpretation No C. difficile detected.  Final     Comment: Performed at  [facility], [address] [place], [place] [address]  Gastrointestinal Panel by PCR , Stool     Status: None   Collection Time: 04/15/20 11:23 PM   Specimen: STOOL  Result Value Ref Range Status   Campylobacter species NOT DETECTED NOT DETECTED Final   Plesimonas shigelloides NOT DETECTED NOT DETECTED Final   Salmonella species NOT DETECTED NOT DETECTED Final   Yersinia enterocolitica NOT DETECTED NOT DETECTED Final   Vibrio species NOT DETECTED NOT DETECTED Final   Vibrio cholerae NOT DETECTED NOT DETECTED Final   Enteroaggregative E coli (EAEC) NOT DETECTED NOT DETECTED Final   Enteropathogenic E coli (EPEC) NOT DETECTED NOT DETECTED Final   Enterotoxigenic E coli (ETEC) NOT DETECTED NOT DETECTED Final   Shiga like toxin producing E coli (STEC) NOT DETECTED NOT DETECTED Final   Shigella/Enteroinvasive E coli (EIEC) NOT DETECTED NOT DETECTED Final   Cryptosporidium NOT DETECTED NOT DETECTED Final   Cyclospora cayetanensis NOT DETECTED NOT DETECTED Final   Entamoeba histolytica NOT DETECTED NOT DETECTED Final   Giardia lamblia NOT DETECTED NOT DETECTED Final   Adenovirus F40/41 NOT DETECTED NOT DETECTED Final   Astrovirus NOT DETECTED NOT DETECTED Final   Norovirus GI/GII NOT DETECTED NOT DETECTED Final   Rotavirus A NOT DETECTED NOT DETECTED Final   Sapovirus (I, II, IV, and V) NOT DETECTED NOT DETECTED Final    Comment: Performed at [facility], [address] [place], [place] [address]  MRSA PCR Screening     Status: None   Collection Time: 04/16/20  1:26 AM   Specimen: Nasal Mucosa; Nasopharyngeal  Result Value Ref Range Status   MRSA by PCR NEGATIVE NEGATIVE Final    Comment:        The GeneXpert MRSA Assay (FDA approved for NASAL specimens only), is one component of a comprehensive MRSA colonization surveillance program. It is not intended to diagnose MRSA infection nor to guide or monitor treatment for MRSA infections. Performed at  [facility], [address], [place],  [address]      Time coordinating discharge: 35 minutes  SIGNED:   Rodena Goldmann, DO [facility] 04/18/2020, 11:36 AM  If 7PM-7AM, please contact night-coverage [email or web address]

## 2020-04-18 NOTE — Progress Notes (Signed)
Repeat blood glucose is 42 mg/dl. Pt remains alert and oriented, has finished pack of peanut butter crackers and additional 120 ml of regular cola. MD Manuella Ghazi notified. Order received for 1/2 amp D50 (12.5 G) IV now. Med administered and will recheck blood sugar in 15 minutes.

## 2020-04-18 NOTE — Progress Notes (Signed)
Pt refused hospital provided breakfast, states, "I don't eat that shit!". Wife states that pt only ever eats snack type foods all day at home (peanut butter crackers, candy bars, cookies, bologna and hot dog weiners, froot loops cereal and diet sodas). Advised pt that this was not a healthy diet and he stated, "I don't give a shit! That's what I like and that's what I eat." Wife states he has been eating this way "forever" and his MDs are aware. Advised pt that I needed him to eat something else above the peanut butter crackers and juice we gave him to treat his low blood sugar. Pt asked for and was given Froot Loop cereal and 2% milk.

## 2020-04-18 NOTE — Progress Notes (Addendum)
Pt CBG: 50. D5NS infusing at 90ml/hr. Pt is alert and oriented. Having appropriate conversation. MD made aware via Amion. Gave orders to obtain CBG Q1hr x 2 and change IVF. Patient refusing to receive D50 ampule for hypoglycemic episode and MD made aware.

## 2020-04-18 NOTE — Progress Notes (Signed)
Inpatient Diabetes Program Recommendations  AACE/ADA: New Consensus Statement on Inpatient Glycemic Control (2015)  Target Ranges:  Prepandial:   less than 140 mg/dL      Peak postprandial:   less than 180 mg/dL (1-2 hours)      Critically ill patients:  140 - 180 mg/dL   Lab Results  Component Value Date   GLUCAP 129 (H) 04/18/2020   HGBA1C 7.5 (H) 04/16/2020    Review of Glycemic Control  Inpatient Diabetes Program Recommendations:   Spoke with patient via phone regarding insulin dosing per insulin pump. Noted patient is having hypoglycemia post meals. Discussed with patient to decrease meal correction for now and discuss with Dr. Chalmers Cater. Patient states his insulin pump if now off.  -Recommend restarting insulin pump and decrease meal coverage and correction per pump and contact Dr. Chalmers Cater. Next appointment with Dr. Chalmers Cater is January 7th. Secure chat sent to Dr. Manuella Ghazi.  Thank you, Nani Gasser. Edgley, RN, MSN, CDE  Diabetes Coordinator Inpatient Glycemic Control Team Team Pager [phone] (8am-5pm) 04/18/2020 9:08 AM

## 2020-04-18 NOTE — Progress Notes (Signed)
Pt stated his insulin pump showed a blood glucose of 80.

## 2020-04-18 NOTE — Progress Notes (Signed)
1583: Notified by NT that blood glucose is reading 34 mg/dl. Pt alert, oriented, denies any c/o. Pt given oral treatment of hypoglycemia with 120 ml orange juice and 116ml regular soda. Also had pt stop his insulin pump. MD Manuella Ghazi notified of current situation and issues with frequent episodes of hypoglycemia since last evening. MD advises have pt eat and is in agreement with stopping insulin pump. Pt currently eating peanut butter crackers as well.

## 2021-11-03 ENCOUNTER — Encounter (HOSPITAL_COMMUNITY): Payer: Self-pay | Admitting: Emergency Medicine

## 2021-11-03 ENCOUNTER — Emergency Department (HOSPITAL_COMMUNITY): Payer: 59

## 2021-11-03 ENCOUNTER — Other Ambulatory Visit: Payer: Self-pay

## 2021-11-03 ENCOUNTER — Observation Stay (HOSPITAL_COMMUNITY)
Admission: EM | Admit: 2021-11-03 | Discharge: 2021-11-05 | Disposition: A | Discharge status: Home / Self Care | Payer: 59 | Attending: Internal Medicine | Admitting: Internal Medicine

## 2021-11-03 DIAGNOSIS — N182 Chronic kidney disease, stage 2 (mild): Secondary | ICD-10-CM | POA: Diagnosis not present

## 2021-11-03 DIAGNOSIS — K2100 Gastro-esophageal reflux disease with esophagitis, without bleeding: Secondary | ICD-10-CM | POA: Diagnosis present

## 2021-11-03 DIAGNOSIS — N179 Acute kidney failure, unspecified: Secondary | ICD-10-CM

## 2021-11-03 DIAGNOSIS — R079 Chest pain, unspecified: Secondary | ICD-10-CM | POA: Diagnosis present

## 2021-11-03 DIAGNOSIS — Z888 Allergy status to other drugs, medicaments and biological substances status: Secondary | ICD-10-CM

## 2021-11-03 DIAGNOSIS — R9431 Abnormal electrocardiogram [ECG] [EKG]: Secondary | ICD-10-CM | POA: Diagnosis present

## 2021-11-03 DIAGNOSIS — F17210 Nicotine dependence, cigarettes, uncomplicated: Secondary | ICD-10-CM | POA: Insufficient documentation

## 2021-11-03 DIAGNOSIS — K219 Gastro-esophageal reflux disease without esophagitis: Secondary | ICD-10-CM | POA: Diagnosis not present

## 2021-11-03 DIAGNOSIS — R7989 Other specified abnormal findings of blood chemistry: Secondary | ICD-10-CM

## 2021-11-03 DIAGNOSIS — R0789 Other chest pain: Secondary | ICD-10-CM | POA: Diagnosis present

## 2021-11-03 DIAGNOSIS — E1110 Type 2 diabetes mellitus with ketoacidosis without coma: Principal | ICD-10-CM | POA: Diagnosis present

## 2021-11-03 DIAGNOSIS — E66812 Obesity, class 2: Secondary | ICD-10-CM | POA: Diagnosis present

## 2021-11-03 DIAGNOSIS — E1122 Type 2 diabetes mellitus with diabetic chronic kidney disease: Secondary | ICD-10-CM | POA: Diagnosis not present

## 2021-11-03 DIAGNOSIS — E785 Hyperlipidemia, unspecified: Secondary | ICD-10-CM | POA: Diagnosis present

## 2021-11-03 DIAGNOSIS — Z79899 Other long term (current) drug therapy: Secondary | ICD-10-CM | POA: Diagnosis not present

## 2021-11-03 DIAGNOSIS — I10 Essential (primary) hypertension: Secondary | ICD-10-CM | POA: Diagnosis present

## 2021-11-03 DIAGNOSIS — E782 Mixed hyperlipidemia: Secondary | ICD-10-CM

## 2021-11-03 DIAGNOSIS — E669 Obesity, unspecified: Secondary | ICD-10-CM | POA: Diagnosis present

## 2021-11-03 DIAGNOSIS — Z885 Allergy status to narcotic agent status: Secondary | ICD-10-CM

## 2021-11-03 DIAGNOSIS — H9192 Unspecified hearing loss, left ear: Secondary | ICD-10-CM | POA: Diagnosis present

## 2021-11-03 DIAGNOSIS — G4733 Obstructive sleep apnea (adult) (pediatric): Secondary | ICD-10-CM | POA: Diagnosis present

## 2021-11-03 DIAGNOSIS — Z6835 Body mass index (BMI) 35.0-35.9, adult: Secondary | ICD-10-CM

## 2021-11-03 DIAGNOSIS — E860 Dehydration: Secondary | ICD-10-CM | POA: Diagnosis present

## 2021-11-03 DIAGNOSIS — I129 Hypertensive chronic kidney disease with stage 1 through stage 4 chronic kidney disease, or unspecified chronic kidney disease: Secondary | ICD-10-CM | POA: Diagnosis not present

## 2021-11-03 DIAGNOSIS — Z9641 Presence of insulin pump (external) (internal): Secondary | ICD-10-CM | POA: Diagnosis present

## 2021-11-03 DIAGNOSIS — Z8719 Personal history of other diseases of the digestive system: Secondary | ICD-10-CM

## 2021-11-03 DIAGNOSIS — Z91148 Patient's other noncompliance with medication regimen for other reason: Secondary | ICD-10-CM

## 2021-11-03 DIAGNOSIS — E875 Hyperkalemia: Secondary | ICD-10-CM | POA: Diagnosis present

## 2021-11-03 DIAGNOSIS — Z720 Tobacco use: Secondary | ICD-10-CM | POA: Diagnosis present

## 2021-11-03 DIAGNOSIS — Z8249 Family history of ischemic heart disease and other diseases of the circulatory system: Secondary | ICD-10-CM

## 2021-11-03 DIAGNOSIS — Z794 Long term (current) use of insulin: Secondary | ICD-10-CM

## 2021-11-03 LAB — BASIC METABOLIC PANEL
Anion gap: 12 (ref 5–15)
Anion gap: 7 (ref 5–15)
Anion gap: 5 (ref 5–15)
BUN: 33 mg/dL — ABNORMAL HIGH (ref 6–20)
BUN: 29 mg/dL — ABNORMAL HIGH (ref 6–20)
BUN: 28 mg/dL — ABNORMAL HIGH (ref 6–20)
CO2: 21 mmol/L — ABNORMAL LOW (ref 22–32)
CO2: 26 mmol/L (ref 22–32)
CO2: 27 mmol/L (ref 22–32)
Calcium: 9.1 mg/dL (ref 8.9–10.3)
Calcium: 9.2 mg/dL (ref 8.9–10.3)
Calcium: 8.9 mg/dL (ref 8.9–10.3)
Chloride: 104 mmol/L (ref 98–111)
Chloride: 106 mmol/L (ref 98–111)
Chloride: 108 mmol/L (ref 98–111)
Creatinine, Ser: 1.79 mg/dL — ABNORMAL HIGH (ref 0.61–1.24)
Creatinine, Ser: 1.57 mg/dL — ABNORMAL HIGH (ref 0.61–1.24)
Creatinine, Ser: 1.36 mg/dL — ABNORMAL HIGH (ref 0.61–1.24)
GFR, Estimated: 45 mL/min — ABNORMAL LOW (ref >60)
GFR, Estimated: 52 mL/min — ABNORMAL LOW (ref >60)
GFR, Estimated: >60 mL/min (ref >60)
Glucose, Bld: 559 mg/dL (ref 70–99)
Glucose, Bld: 315 mg/dL — ABNORMAL HIGH (ref 70–99)
Glucose, Bld: 117 mg/dL — ABNORMAL HIGH (ref 70–99)
Potassium: 3.7 mmol/L (ref 3.5–5.1)
Potassium: 3.5 mmol/L (ref 3.5–5.1)
Potassium: 3.4 mmol/L — ABNORMAL LOW (ref 3.5–5.1)
Sodium: 137 mmol/L (ref 135–145)
Sodium: 139 mmol/L (ref 135–145)
Sodium: 140 mmol/L (ref 135–145)

## 2021-11-03 LAB — GLUCOSE, CAPILLARY
Glucose-Capillary: 323 mg/dL — ABNORMAL HIGH (ref 70–99)
Glucose-Capillary: 214 mg/dL — ABNORMAL HIGH (ref 70–99)
Glucose-Capillary: 164 mg/dL — ABNORMAL HIGH (ref 70–99)
Glucose-Capillary: 105 mg/dL — ABNORMAL HIGH (ref 70–99)
Glucose-Capillary: 109 mg/dL — ABNORMAL HIGH (ref 70–99)
Glucose-Capillary: 116 mg/dL — ABNORMAL HIGH (ref 70–99)
Glucose-Capillary: 127 mg/dL — ABNORMAL HIGH (ref 70–99)
Glucose-Capillary: 133 mg/dL — ABNORMAL HIGH (ref 70–99)
Glucose-Capillary: 296 mg/dL — ABNORMAL HIGH (ref 70–99)

## 2021-11-03 LAB — CBG MONITORING, ED
Glucose-Capillary: >600 mg/dL (ref 70–99)
Glucose-Capillary: >600 mg/dL (ref 70–99)
Glucose-Capillary: >600 mg/dL (ref 70–99)
Glucose-Capillary: >600 mg/dL (ref 70–99)
Glucose-Capillary: 523 mg/dL (ref 70–99)
Glucose-Capillary: 550 mg/dL (ref 70–99)
Glucose-Capillary: 453 mg/dL — ABNORMAL HIGH (ref 70–99)
Glucose-Capillary: 441 mg/dL — ABNORMAL HIGH (ref 70–99)

## 2021-11-03 LAB — COMPREHENSIVE METABOLIC PANEL
ALT: 37 U/L (ref 0–44)
AST: 26 U/L (ref 15–41)
Albumin: 4.6 g/dL (ref 3.5–5.0)
Alkaline Phosphatase: 87 U/L (ref 38–126)
Anion gap: 18 — ABNORMAL HIGH (ref 5–15)
BUN: 30 mg/dL — ABNORMAL HIGH (ref 6–20)
CO2: 19 mmol/L — ABNORMAL LOW (ref 22–32)
Calcium: 9.1 mg/dL (ref 8.9–10.3)
Chloride: 95 mmol/L — ABNORMAL LOW (ref 98–111)
Creatinine, Ser: 1.69 mg/dL — ABNORMAL HIGH (ref 0.61–1.24)
GFR, Estimated: 48 mL/min — ABNORMAL LOW (ref >60)
Glucose, Bld: 831 mg/dL (ref 70–99)
Potassium: 7 mmol/L (ref 3.5–5.1)
Sodium: 132 mmol/L — ABNORMAL LOW (ref 135–145)
Total Bilirubin: 1.7 mg/dL — ABNORMAL HIGH (ref 0.3–1.2)
Total Protein: 7.8 g/dL (ref 6.5–8.1)

## 2021-11-03 LAB — BLOOD GAS, VENOUS
Acid-base deficit: 9 mmol/L — ABNORMAL HIGH (ref 0.0–2.0)
Bicarbonate: 16.9 mmol/L — ABNORMAL LOW (ref 20.0–28.0)
Drawn by: 1517
FIO2: 21 %
O2 Saturation: 62.8 %
Patient temperature: 36.5
pCO2, Ven: 35 mmHg — ABNORMAL LOW (ref 44–60)
pH, Ven: 7.29 (ref 7.25–7.43)
pO2, Ven: 34 mmHg (ref 32–45)

## 2021-11-03 LAB — CBC WITH DIFFERENTIAL/PLATELET
Abs Immature Granulocytes: 0.1 10*3/uL — ABNORMAL HIGH (ref 0.00–0.07)
Basophils Absolute: 0.1 10*3/uL (ref 0.0–0.1)
Basophils Relative: 1 %
Eosinophils Absolute: 0 10*3/uL (ref 0.0–0.5)
Eosinophils Relative: 0 %
HCT: 49.5 % (ref 39.0–52.0)
Hemoglobin: 16.2 g/dL (ref 13.0–17.0)
Immature Granulocytes: 1 %
Lymphocytes Relative: 8 %
Lymphs Abs: 1.4 10*3/uL (ref 0.7–4.0)
MCH: 29.7 pg (ref 26.0–34.0)
MCHC: 32.7 g/dL (ref 30.0–36.0)
MCV: 90.8 fL (ref 80.0–100.0)
Monocytes Absolute: 0.7 10*3/uL (ref 0.1–1.0)
Monocytes Relative: 4 %
Neutro Abs: 14.9 10*3/uL — ABNORMAL HIGH (ref 1.7–7.7)
Neutrophils Relative %: 86 %
Platelets: 199 10*3/uL (ref 150–400)
RBC: 5.45 MIL/uL (ref 4.22–5.81)
RDW: 12.1 % (ref 11.5–15.5)
WBC: 17.3 10*3/uL — ABNORMAL HIGH (ref 4.0–10.5)
nRBC: 0 % (ref 0.0–0.2)

## 2021-11-03 LAB — BETA-HYDROXYBUTYRIC ACID
Beta-Hydroxybutyric Acid: 4.78 mmol/L — ABNORMAL HIGH (ref 0.05–0.27)
Beta-Hydroxybutyric Acid: 3.76 mmol/L — ABNORMAL HIGH (ref 0.05–0.27)
Beta-Hydroxybutyric Acid: 0.14 mmol/L (ref 0.05–0.27)

## 2021-11-03 LAB — URINALYSIS, MICROSCOPIC (REFLEX)

## 2021-11-03 LAB — HEMOGLOBIN A1C
Hgb A1c MFr Bld: 8.1 % — ABNORMAL HIGH (ref 4.8–5.6)
Mean Plasma Glucose: 185.77 mg/dL

## 2021-11-03 LAB — URINALYSIS, ROUTINE W REFLEX MICROSCOPIC
Bilirubin Urine: NEGATIVE
Glucose, UA: >=500 mg/dL — AB
Hgb urine dipstick: NEGATIVE
Ketones, ur: 40 mg/dL — AB
Leukocytes,Ua: NEGATIVE
Nitrite: NEGATIVE
Protein, ur: NEGATIVE mg/dL
Specific Gravity, Urine: <1.005 — ABNORMAL LOW (ref 1.005–1.030)
pH: 5.5 (ref 5.0–8.0)

## 2021-11-03 LAB — MAGNESIUM: Magnesium: 2.1 mg/dL (ref 1.7–2.4)

## 2021-11-03 LAB — HIV ANTIBODY (ROUTINE TESTING W REFLEX): HIV Screen 4th Generation wRfx: NONREACTIVE

## 2021-11-03 LAB — TROPONIN I (HIGH SENSITIVITY)
Troponin I (High Sensitivity): 2 ng/L (ref <18)
Troponin I (High Sensitivity): 3 ng/L (ref <18)

## 2021-11-03 LAB — PHOSPHORUS: Phosphorus: <1 mg/dL — CL (ref 2.5–4.6)

## 2021-11-03 MED ORDER — INSULIN ASPART 100 UNIT/ML IJ SOLN
0.0000 [IU] | Freq: Every day | INTRAMUSCULAR | Status: DC
  Administered 2021-11-03: 3 [IU] via SUBCUTANEOUS

## 2021-11-03 MED ORDER — DEXTROSE 50 % IV SOLN: 0.0000 mL | INTRAVENOUS | Status: DC | PRN

## 2021-11-03 MED ORDER — INSULIN DETEMIR 100 UNIT/ML ~~LOC~~ SOLN
12.0000 [IU] | Freq: Two times a day (BID) | SUBCUTANEOUS | Status: DC
  Administered 2021-11-03: 12 [IU] via SUBCUTANEOUS
  Filled 2021-11-03 (×4): qty 0.12

## 2021-11-03 MED ORDER — INSULIN REGULAR(HUMAN) IN NACL 100-0.9 UT/100ML-% IV SOLN
INTRAVENOUS | Status: DC
  Administered 2021-11-03: 13 [IU]/h via INTRAVENOUS
  Administered 2021-11-03: 12 [IU]/h via INTRAVENOUS
  Filled 2021-11-03: qty 100

## 2021-11-03 MED ORDER — SIMVASTATIN 20 MG PO TABS
40.0000 mg | ORAL_TABLET | Freq: Every day | ORAL | Status: DC
  Administered 2021-11-03 – 2021-11-04 (×2): 40 mg via ORAL
  Filled 2021-11-03 (×2): qty 2

## 2021-11-03 MED ORDER — INSULIN ASPART 100 UNIT/ML IJ SOLN: 0.0000 [IU] | Freq: Three times a day (TID) | INTRAMUSCULAR | Status: DC

## 2021-11-03 MED ORDER — INSULIN REGULAR(HUMAN) IN NACL 100-0.9 UT/100ML-% IV SOLN
INTRAVENOUS | Status: DC
  Administered 2021-11-03: 13 [IU]/h via INTRAVENOUS
  Filled 2021-11-03: qty 100

## 2021-11-03 MED ORDER — METOCLOPRAMIDE HCL 5 MG/ML IJ SOLN
10.0000 mg | Freq: Once | INTRAMUSCULAR | Status: AC
  Administered 2021-11-03: 10 mg via INTRAVENOUS
  Filled 2021-11-03: qty 2

## 2021-11-03 MED ORDER — CHLORHEXIDINE GLUCONATE CLOTH 2 % EX PADS
6.0000 | MEDICATED_PAD | Freq: Every day | CUTANEOUS | Status: DC
Start: 2021-11-03 — End: 2021-11-05
  Administered 2021-11-03 – 2021-11-05 (×3): 6 via TOPICAL

## 2021-11-03 MED ORDER — LACTATED RINGERS IV SOLN
INTRAVENOUS | Status: DC
Start: 2021-11-03 — End: 2021-11-03

## 2021-11-03 MED ORDER — SODIUM BICARBONATE 8.4 % IV SOLN
50.0000 meq | Freq: Once | INTRAVENOUS | Status: AC
Start: 2021-11-03 — End: 2021-11-03
  Administered 2021-11-03: 50 meq via INTRAVENOUS
  Filled 2021-11-03: qty 50

## 2021-11-03 MED ORDER — PANTOPRAZOLE SODIUM 40 MG PO TBEC
40.0000 mg | DELAYED_RELEASE_TABLET | Freq: Every day | ORAL | Status: DC
  Administered 2021-11-03 – 2021-11-05 (×3): 40 mg via ORAL
  Filled 2021-11-03 (×3): qty 1

## 2021-11-03 MED ORDER — HEPARIN SODIUM (PORCINE) 5000 UNIT/ML IJ SOLN
5000.0000 [IU] | Freq: Three times a day (TID) | INTRAMUSCULAR | Status: DC
  Administered 2021-11-03 – 2021-11-04 (×6): 5000 [IU] via SUBCUTANEOUS
  Filled 2021-11-03 (×6): qty 1

## 2021-11-03 MED ORDER — DEXTROSE IN LACTATED RINGERS 5 % IV SOLN: INTRAVENOUS | Status: DC

## 2021-11-03 MED ORDER — PROCHLORPERAZINE EDISYLATE 10 MG/2ML IJ SOLN: 10.0000 mg | Freq: Four times a day (QID) | INTRAMUSCULAR | Status: DC | PRN

## 2021-11-03 MED ORDER — NICOTINE 14 MG/24HR TD PT24
14.0000 mg | MEDICATED_PATCH | Freq: Every day | TRANSDERMAL | Status: DC
  Filled 2021-11-03: qty 1

## 2021-11-03 MED ORDER — LACTATED RINGERS IV SOLN: INTRAVENOUS | Status: DC

## 2021-11-03 MED ORDER — ACETAMINOPHEN 325 MG PO TABS: 650.0000 mg | ORAL_TABLET | Freq: Four times a day (QID) | ORAL | Status: DC | PRN

## 2021-11-03 MED ORDER — LACTATED RINGERS IV BOLUS
1000.0000 mL | Freq: Once | INTRAVENOUS | Status: AC
  Administered 2021-11-03: 1000 mL via INTRAVENOUS

## 2021-11-03 MED ORDER — CALCIUM GLUCONATE-NACL 1-0.675 GM/50ML-% IV SOLN
1.0000 g | Freq: Once | INTRAVENOUS | Status: AC
  Administered 2021-11-03: 1000 mg via INTRAVENOUS
  Filled 2021-11-03: qty 50

## 2021-11-03 NOTE — Assessment & Plan Note (Addendum)
-   Avoid medications that can further prolong QT -Continue to follow electrolytes and replete as needed.

## 2021-11-03 NOTE — Assessment & Plan Note (Signed)
-  Calcium gluconate and sodium bicarbonate provided at time of admission. -potassium level stabilized and corrected; potassium WNL at discharge.

## 2021-11-03 NOTE — ED Notes (Addendum)
Date and time results received: 11/03/21 8:53 AM  (use smartphrase ".now" to insert current time)  Test: Glucose Critical Value: 559  Name of Provider Notified: Dr. Dyann Kief  Orders Received? Or Actions Taken?: see chart

## 2021-11-03 NOTE — ED Triage Notes (Signed)
Pt c/o sudden central chest pain that started tonight. Pt also c/o high blood sugars tonight, pt states normally he is able to get his sugars down but it hasn't worked Midwife.

## 2021-11-03 NOTE — Assessment & Plan Note (Signed)
-  In the setting of prerenal azotemia -No signs of acute infection; continue to maintain adequate hydration.  Renal function adequately improving.

## 2021-11-03 NOTE — Assessment & Plan Note (Signed)
-  Probably in the setting of medication noncompliance and diet indiscretion; no acute infection appreciated.   -EKG not demonstrating acute ischemic changes and negative troponin ruling out infarction. -Excellent response to insulin drip, n.p.o. status and aggressive fluid resuscitation. -Successfully transition to sliding scale and long-acting; now with plans to initiate home insulin pump. -CBGs still fluctuating, and at some point up to 400 range. -Basal insulin at the moment provided to prevent any future derangement and chances to get him into DKA again. -Insulin pump dose adjusted and instructions to maintain adequate hydration and follow modified carbohydrate diet provided. -Patient will follow-up with PCP and his endocrinologist/insulin pump provider in order to continue adjusting and titrating insulin management.   -Patient denies nausea or vomiting. -Stable electrolytes. -A1c 8.1

## 2021-11-03 NOTE — ED Provider Notes (Signed)
[facility] Provider Note   CSN: [id number] Arrival date & time: 11/03/21  0249     History  Chief Complaint  Patient presents with   Chest Pain    [name] is a 51 y.o. male.  Patient presents to the emergency department for evaluation of chest pain.  Patient reports that his pain began tonight.  He has noticed that his blood sugar has progressively went up over the last few hours as well.       Home Medications Prior to Admission medications   Medication Sig Start Date End Date Taking? Authorizing Provider  benzonatate (TESSALON) 200 MG capsule Take 200 mg by mouth 3 (three) times daily as needed for cough. 04/11/20   [provider]  diphenoxylate-atropine (LOMOTIL) 2.5-0.025 MG tablet Take 1 tablet by mouth every 4 (four) hours as needed. 04/09/20   [provider]  losartan (COZAAR) 100 MG tablet Take 100 mg daily by mouth. 03/13/17   [provider]  Multiple Vitamins-Minerals (CENTRUM PO) Take 1 tablet by mouth daily.    [provider]  NOVOLOG 100 UNIT/ML injection Inject 30 Units into the skin daily. In insulin pump 07/08/17   [provider]  ondansetron (ZOFRAN ODT) 4 MG disintegrating tablet Take 1 tablet (4 mg total) by mouth every 8 (eight) hours as needed for nausea or vomiting. 04/10/20   Petrucelli, Samantha R, PA-C  pantoprazole (PROTONIX) 40 MG tablet Take 40 mg daily by mouth. 02/03/17   [provider]  simvastatin (ZOCOR) 40 MG tablet Take 40 mg by mouth daily.    [provider]      Allergies    Codeine and Glucophage [metformin hydrochloride]    Review of Systems   Review of Systems  Physical Exam Updated Vital Signs BP (!) 118/99   Pulse (!) 125   Temp 97.8 F (36.6 C) (Oral)   Resp 20   Ht '5\' 2"'$  (1.575 m)   Wt 85.7 kg   SpO2 99%   BMI 34.57 kg/m  Physical Exam Vitals and nursing note reviewed.  Constitutional:      General: He is not in acute  distress.    Appearance: He is well-developed.  HENT:     Head: Normocephalic and atraumatic.     Mouth/Throat:     Mouth: Mucous membranes are moist.  Eyes:     General: Vision grossly intact. Gaze aligned appropriately.     Extraocular Movements: Extraocular movements intact.     Conjunctiva/sclera: Conjunctivae normal.  Cardiovascular:     Rate and Rhythm: Regular rhythm. Tachycardia present.     Pulses: Normal pulses.     Heart sounds: Normal heart sounds, S1 normal and S2 normal. No murmur heard.    No friction rub. No gallop.  Pulmonary:     Effort: Pulmonary effort is normal. No respiratory distress.     Breath sounds: Normal breath sounds.  Abdominal:     Palpations: Abdomen is soft.     Tenderness: There is no abdominal tenderness. There is no guarding or rebound.     Hernia: No hernia is present.  Musculoskeletal:        General: No swelling.     Cervical back: Full passive range of motion without pain, normal range of motion and neck supple. No pain with movement, spinous process tenderness or muscular tenderness. Normal range of motion.     Right lower leg: No edema.     Left lower leg: No  edema.  Skin:    General: Skin is warm and dry.     Capillary Refill: Capillary refill takes less than 2 seconds.     Findings: No ecchymosis, erythema, lesion or wound.  Neurological:     Mental Status: He is alert and oriented to person, place, and time.     GCS: GCS eye subscore is 4. GCS verbal subscore is 5. GCS motor subscore is 6.     Cranial Nerves: Cranial nerves 2-12 are intact.     Sensory: Sensation is intact.     Motor: Motor function is intact. No weakness or abnormal muscle tone.     Coordination: Coordination is intact.  Psychiatric:        Mood and Affect: Mood normal.        Speech: Speech normal.        Behavior: Behavior normal.     ED Results / Procedures / Treatments   Labs (all labs ordered are listed, but only abnormal results are displayed) Labs  Reviewed  CBC WITH DIFFERENTIAL/PLATELET - Abnormal; Notable for the following components:      Result Value   WBC 17.3 (*)    Neutro Abs 14.9 (*)    Abs Immature Granulocytes 0.10 (*)    All other components within normal limits  COMPREHENSIVE METABOLIC PANEL - Abnormal; Notable for the following components:   Sodium 132 (*)    Potassium 7.0 (*)    Chloride 95 (*)    CO2 19 (*)    Glucose, Bld 831 (*)    BUN 30 (*)    Creatinine, Ser 1.69 (*)    Total Bilirubin 1.7 (*)    GFR, Estimated 48 (*)    Anion gap 18 (*)    All other components within normal limits  URINALYSIS, ROUTINE W REFLEX MICROSCOPIC - Abnormal; Notable for the following components:   Specific Gravity, Urine <1.005 (*)    Glucose, UA >=500 (*)    Ketones, ur 40 (*)    All other components within normal limits  BETA-HYDROXYBUTYRIC ACID - Abnormal; Notable for the following components:   Beta-Hydroxybutyric Acid 4.78 (*)    All other components within normal limits  URINALYSIS, MICROSCOPIC (REFLEX) - Abnormal; Notable for the following components:   Bacteria, UA FEW (*)    All other components within normal limits  BLOOD GAS, VENOUS - Abnormal; Notable for the following components:   pCO2, Ven 35 (*)    Bicarbonate 16.9 (*)    Acid-base deficit 9.0 (*)    All other components within normal limits  CBG MONITORING, ED - Abnormal; Notable for the following components:   Glucose-Capillary >600 (*)    All other components within normal limits  CBG MONITORING, ED - Abnormal; Notable for the following components:   Glucose-Capillary >600 (*)    All other components within normal limits  CBG MONITORING, ED - Abnormal; Notable for the following components:   Glucose-Capillary >600 (*)    All other components within normal limits  TROPONIN I (HIGH SENSITIVITY)  TROPONIN I (HIGH SENSITIVITY)    EKG EKG Interpretation  Date/Time:  Sunday November 03 2021 03:14:31 EDT Ventricular Rate:  120 PR Interval:  136 QRS  Duration: 88 QT Interval:  310 QTC Calculation: 438 R Axis:   80 Text Interpretation: Sinus tachycardia Probable left atrial enlargement Confirmed by Orpah Greek [phone]) on 11/03/2021 4:26:10 AM  Radiology DG Chest Port 1 View  Result Date: 11/03/2021 CLINICAL DATA:  Chest pain EXAM:  PORTABLE CHEST 1 VIEW COMPARISON:  04/15/2020 FINDINGS: Low volume chest with interstitial crowding. There is no edema, consolidation, effusion, or pneumothorax. Normal heart size and stable mediastinal contours. Artifact from EKG leads. IMPRESSION: Low volume chest without acute finding Electronically Signed   By: Jorje Guild M.D.   On: 11/03/2021 05:20    Procedures Procedures    Medications Ordered in ED Medications  insulin regular, human (MYXREDLIN) 100 units/ 100 mL infusion (13 Units/hr Intravenous New Bag/Given 11/03/21 0458)  lactated ringers infusion ( Intravenous New Bag/Given 11/03/21 0456)  dextrose 5 % in lactated ringers infusion (has no administration in time range)  dextrose 50 % solution 0-50 mL (has no administration in time range)  lactated ringers bolus 1,000 mL (0 mLs Intravenous Stopped 11/03/21 0519)    ED Course/ Medical Decision Making/ A&P                           Medical Decision Making Amount and/or Complexity of Data Reviewed Labs: ordered. Decision-making details documented in ED Course. Radiology: ordered and independent interpretation performed. Decision-making details documented in ED Course. ECG/medicine tests: ordered and independent interpretation performed. Decision-making details documented in ED Course.  Risk Prescription drug management. Decision regarding hospitalization.   Patient presents to the emergency department for evaluation of chest pain.  Differential diagnosis is unstable angina, NSTEMI, noncardiac chest pain.  Patient found to be hyperglycemic at arrival, above 600.  Is tachycardic.  Diabetic ketoacidosis considered.  Patient's sugar  found to be above 800.  He does have an anion gap with positive beta hydroxybutyric acid.  This is consistent with diabetic ketoacidosis.  Given IV fluid bolus and started on insulin drip by Endo tool.  Initial potassium is high with an acute kidney injury.  We will need to recheck after fluids and initiation of insulin.  From a cardiac standpoint, EKG shows tachycardia but no ischemic changes.  Troponins negative.  Appropriate for hospitalization for further management.  CRITICAL CARE Performed by: Orpah Greek   Total critical care time: 35 minutes  Critical care time was exclusive of separately billable procedures and treating other patients.  Critical care was necessary to treat or prevent imminent or life-threatening deterioration.  Critical care was time spent personally by me on the following activities: development of treatment plan with patient and/or surrogate as well as nursing, discussions with consultants, evaluation of patient's response to treatment, examination of patient, obtaining history from patient or surrogate, ordering and performing treatments and interventions, ordering and review of laboratory studies, ordering and review of radiographic studies, pulse oximetry and re-evaluation of patient's condition.         Final Clinical Impression(s) / ED Diagnoses Final diagnoses:  Chest pain, unspecified type  Diabetic ketoacidosis without coma associated with type 2 diabetes mellitus [facility])    Rx / DC Orders ED Discharge Orders     None         Orpah Greek, MD 11/03/21 [phone]

## 2021-11-03 NOTE — Assessment & Plan Note (Signed)
-  Continue statins. -Heart healthy diet discussed with patient.

## 2021-11-03 NOTE — Assessment & Plan Note (Signed)
Continue PPI ?

## 2021-11-03 NOTE — Assessment & Plan Note (Addendum)
-  Cessation counseling provided -Nicotine patch ordered. 

## 2021-11-03 NOTE — H&P (Signed)
History and Physical    Patient: [name] BJY:[id number] DOB: 1970-07-08 DOA: 11/03/2021 DOS: the patient was seen and examined on 11/03/2021 PCP: Sharilyn Sites, MD  Patient coming from: Home  Chief Complaint:  Chief Complaint  Patient presents with   Chest Pain   HPI: [name] is a 51 y.o. male with medical history significant of type 2 diabetes (with nephropathy) insulin-dependent; hypertension, hyperlipidemia, class II obesity, sleep apnea, after esophageal reflux disease and chronic kidney disease stage II; who presented to the hospital secondary to nausea/vomiting, associated general malaise and chest discomfort.  Symptoms present for approximately 12 hours prior to admission.  Patient reports being compliant with medication.  He has not noticed any increase in his blood sugar that despite the use of insulin continue to go up.  He became sick, experienced nausea, discomfort in his mid epigastric area/chest, nonradiated, dull in nature and presented to the emergency department for evaluation and management. Patient reports no fever, no dysuria, no hematemesis, no melena, no hematochezia, no shortness of breath, diaphoresis, focal weakness or any other complaints  In the ED work-up demonstrated no acute cardiopulmonary process; negative troponins x2, no EKG changes suggesting ischemia and work-up demonstrating hyperkalemia, elevated blood sugar and meeting DKA process.  Patient found to be dehydrated, tachycardic and with signs of acute kidney injury.  IV fluids and insulin drip initiated.  TRH contacted to place in the hospital for further evaluation and management.   Review of Systems: As mentioned in the history of present illness. All other systems reviewed and are negative. Past Medical History:  Diagnosis Date   CKD (chronic kidney disease), stage II    Deafness    Left ear only. Congenital.   GERD (gastroesophageal reflux disease)    History of influenza    Hyperlipidemia     Hypertension    Sleep apnea    Thrombocytopenia (HCC)    Type 2 diabetes mellitus ([place])    Vitamin B12 deficiency    Past Surgical History:  Procedure Laterality Date   COLONOSCOPY WITH PROPOFOL N/A 01/04/2020   Procedure: COLONOSCOPY WITH PROPOFOL;  Surgeon: Rogene Houston, MD;  Location: [facility];  Service: Endoscopy;  Laterality: N/A;  [place]     Insulin   NASAL SEPTUM SURGERY     POLYPECTOMY  01/04/2020   Procedure: POLYPECTOMY;  Surgeon: Rogene Houston, MD;  Location: [facility];  Service: Endoscopy;;   Social History:  reports that he has been smoking cigarettes. He has a 40.50 pack-year smoking history. He has never used smokeless tobacco. He reports current alcohol use. He reports that he does not use drugs.  Allergies  Allergen Reactions   Codeine Nausea Only    Drowsiness   Glucophage [Metformin Hydrochloride] Other (See Comments)    Vomiting and cramps    Family History  Problem Relation Age of Onset   Hypertension Mother    Hypertension Father    Heart disease Father     Prior to Admission medications   Medication Sig Start Date End Date Taking? Authorizing Provider  benzonatate (TESSALON) 200 MG capsule Take 200 mg by mouth 3 (three) times daily as needed for cough. 04/11/20   [provider]  diphenoxylate-atropine (LOMOTIL) 2.5-0.025 MG tablet Take 1 tablet by mouth every 4 (four) hours as needed. 04/09/20   [provider]  losartan (COZAAR) 100 MG tablet Take 100 mg daily by mouth. 03/13/17  [provider]  Multiple Vitamins-Minerals (CENTRUM PO) Take 1 tablet by mouth daily.    [provider]  NOVOLOG 100 UNIT/ML injection Inject 30 Units into the skin daily. In insulin pump 07/08/17   [provider]  ondansetron (ZOFRAN ODT) 4 MG disintegrating tablet Take 1 tablet (4 mg total) by mouth every 8 (eight) hours as needed for nausea or vomiting. 04/10/20    Petrucelli, Samantha R, PA-C  pantoprazole (PROTONIX) 40 MG tablet Take 40 mg daily by mouth. 02/03/17   [provider]  simvastatin (ZOCOR) 40 MG tablet Take 40 mg by mouth daily.    [provider]    Physical Exam: Vitals:   11/03/21 0430 11/03/21 0530 11/03/21 0730 11/03/21 0800  BP: (!) 122/105 (!) 118/99 115/63 (!) 119/97  Pulse: (!) 125 (!) 125 (!) 125 (!) 130  Resp:  20 (!) 21 (!) 27  Temp:      TempSrc:      SpO2: 99% 99% 94% 93%  Weight:      Height:       General exam: Alert, awake, oriented x 3; currently chest pain-free; reports feeling nauseated but no further vomiting.  Good saturation on room air. Respiratory system: Clear to auscultation. Respiratory effort normal.  No using accessory muscles. Cardiovascular system: Sinus tachycardia, no rubs, no gallops, no JVD. Gastrointestinal system: Abdomen is obese, nondistended, soft and mildly tender to palpation in midepigastric area.  No guarding, positive bowel sounds. No organomegaly or masses felt. Central nervous system: Alert and oriented. No focal neurological deficits. Extremities: No cyanosis or clubbing. Skin: No petechiae. Psychiatry: Judgement and insight appear normal. Mood & affect appropriate.   Data Reviewed: Chest x-ray without acute cardiopulmonary process. Positive beta hydroxybutyric acid (4.78) CBGs in presentation over 600. Urinalysis with more than 500 glucose, 40 ketones, no nitrites, no leukocyte esterase. Troponin negative x2 EKG without acute ischemic changes Potassium 7.0; sodium 132, chloride 95, bicarb 19, BUN 30, creatinine 1.69 and anion gap 18.  Assessment and Plan: * DKA (diabetic ketoacidosis) ([place]) - Probably in the setting of medication noncompliance and diet indiscretion; no acute infection appreciated.  EKG not demonstrating acute ischemic changes and negative troponin ruling out infarction. -Treatment with aggressive fluid resuscitation and insulin drip  following Endo tool initiated -Patient will be admitted to stepdown -Check A1c -N.p.o. status. -Analgesics and antiemetics will be provided. -Follow clinical response.  HTN (hypertension) - Stable overall -Holding ARB agents in the setting of acute kidney injury -Follow vital signs and if needed will use hydralazine.  GERD (gastroesophageal reflux disease) - Continue PPI.  Hyperkalemia - Calcium gluconate and sodium bicarbonate provided at time of admission -No acute changes appreciated on EKG -Telemetry monitoring provided -Expecting correction of patient's potassium level with treatment of DKA.  Prolonged QT interval - Avoid medications that can further prolong QT -Follow ultralights and replete as needed.  Hyperlipidemia - Continue statins.  Tobacco abuse - Cessation counseling provided -Nicotine patch ordered.  CKD (chronic kidney disease), stage II - Stage II at baseline -Prerenal azotemia and dehydration most likely responsible for acute kidney injury component. -Continue aggressive fluid resuscitation -Minimize/avoid the use of nephrotoxic agents -Follow renal function trend and electrolytes.  AKI (acute kidney injury) ([place]) - In the setting of prerenal azotemia -IV fluid resuscitation -No signs of acute infection appreciated on UA -Follow renal function trend -Patient with chronic kidney disease a stage II at baseline from diabetes.  Pseudohyponatremia - In the setting of hyperglycemia and very  likely some dehydration -IV fluids will be provided -Follow electrolytes trend after sugar corrected.  Class II obesity -Body mass index is 35.44 kg/m. -Low calorie diet, portion control and increase physical activity discussed with patient.  History obstructive sleep apnea -Not on CPAP at home.    Advance Care Planning:   Code Status: Full Code   Consults: None  Family Communication: No family at bedside.  Severity of Illness: The appropriate patient  status for this patient is INPATIENT. Inpatient status is judged to be reasonable and necessary in order to provide the required intensity of service to ensure the patient's safety. The patient's presenting symptoms, physical exam findings, and initial radiographic and laboratory data in the context of their chronic comorbidities is felt to place them at high risk for further clinical deterioration. Furthermore, it is not anticipated that the patient will be medically stable for discharge from the hospital within 2 midnights of admission.   * I certify that at the point of admission it is my clinical judgment that the patient will require inpatient hospital care spanning beyond 2 midnights from the point of admission due to high intensity of service, high risk for further deterioration and high frequency of surveillance required.*  Author: Barton Dubois, MD 11/03/2021 9:04 AM  For on call review [email or web address].

## 2021-11-03 NOTE — Assessment & Plan Note (Addendum)
-  Stable overall -Continue holding ARB agents in the setting of acute kidney injury for another 24 hours after discharge prior to resuming it. -Heart healthy diet discussed with patient. -I to obtain adequate hydration.

## 2021-11-03 NOTE — Assessment & Plan Note (Signed)
-  Stage II at baseline -Prerenal azotemia and dehydration most likely responsible for acute kidney injury component. -Creatinine usually in the 1.1-1.2 range -At discharge 1.2 creatinine appreciated. -Patient advised to maintain adequate hydration and instructed to hold his losartan for another 24 hours prior to resumption. -Recommending reassess basic metabolic panel follow-up visit to assess renal function and stability.

## 2021-11-03 NOTE — Assessment & Plan Note (Signed)
-  In the setting of hyperglycemia and very likely some dehydration -Continue to maintain adequate hydration. -Corrected sodium level within normal limits at discharge. -Repeat basic metabolic panel follow-up visit to follow electrolytes trend.

## 2021-11-04 DIAGNOSIS — E1110 Type 2 diabetes mellitus with ketoacidosis without coma: Secondary | ICD-10-CM | POA: Diagnosis not present

## 2021-11-04 DIAGNOSIS — N179 Acute kidney failure, unspecified: Secondary | ICD-10-CM | POA: Diagnosis not present

## 2021-11-04 DIAGNOSIS — K219 Gastro-esophageal reflux disease without esophagitis: Secondary | ICD-10-CM | POA: Diagnosis not present

## 2021-11-04 DIAGNOSIS — N182 Chronic kidney disease, stage 2 (mild): Secondary | ICD-10-CM | POA: Diagnosis not present

## 2021-11-04 DIAGNOSIS — E669 Obesity, unspecified: Secondary | ICD-10-CM

## 2021-11-04 LAB — GLUCOSE, CAPILLARY
Glucose-Capillary: 278 mg/dL — ABNORMAL HIGH (ref 70–99)
Glucose-Capillary: 380 mg/dL — ABNORMAL HIGH (ref 70–99)
Glucose-Capillary: 363 mg/dL — ABNORMAL HIGH (ref 70–99)
Glucose-Capillary: 302 mg/dL — ABNORMAL HIGH (ref 70–99)
Glucose-Capillary: 349 mg/dL — ABNORMAL HIGH (ref 70–99)

## 2021-11-04 LAB — BASIC METABOLIC PANEL
Anion gap: 6 (ref 5–15)
BUN: 25 mg/dL — ABNORMAL HIGH (ref 6–20)
CO2: 26 mmol/L (ref 22–32)
Calcium: 8.6 mg/dL — ABNORMAL LOW (ref 8.9–10.3)
Chloride: 109 mmol/L (ref 98–111)
Creatinine, Ser: 1.33 mg/dL — ABNORMAL HIGH (ref 0.61–1.24)
GFR, Estimated: >60 mL/min (ref >60)
Glucose, Bld: 266 mg/dL — ABNORMAL HIGH (ref 70–99)
Potassium: 4.3 mmol/L (ref 3.5–5.1)
Sodium: 141 mmol/L (ref 135–145)

## 2021-11-04 LAB — PHOSPHORUS: Phosphorus: 2.1 mg/dL — ABNORMAL LOW (ref 2.5–4.6)

## 2021-11-04 LAB — MRSA NEXT GEN BY PCR, NASAL: MRSA by PCR Next Gen: NOT DETECTED

## 2021-11-04 MED ORDER — INSULIN DETEMIR 100 UNIT/ML ~~LOC~~ SOLN
17.0000 [IU] | Freq: Two times a day (BID) | SUBCUTANEOUS | Status: DC
  Filled 2021-11-04 (×5): qty 0.17

## 2021-11-04 MED ORDER — K PHOS MONO-SOD PHOS DI & MONO 155-852-130 MG PO TABS
250.0000 mg | ORAL_TABLET | Freq: Two times a day (BID) | ORAL | Status: AC
Start: 2021-11-04 — End: 2021-11-05
  Administered 2021-11-04 – 2021-11-05 (×2): 250 mg via ORAL
  Filled 2021-11-04 (×2): qty 1

## 2021-11-04 MED ORDER — INSULIN PUMP
Freq: Three times a day (TID) | SUBCUTANEOUS | Status: DC
  Filled 2021-11-04: qty 1

## 2021-11-04 MED ORDER — INSULIN ASPART 100 UNIT/ML IJ SOLN: 4.0000 [IU] | Freq: Three times a day (TID) | INTRAMUSCULAR | Status: DC

## 2021-11-04 MED ORDER — INSULIN ASPART 100 UNIT/ML IJ SOLN
0.0000 [IU] | Freq: Three times a day (TID) | INTRAMUSCULAR | Status: DC
  Administered 2021-11-04 (×2): 0 [IU] via SUBCUTANEOUS
  Administered 2021-11-04: 15 [IU] via SUBCUTANEOUS
  Administered 2021-11-04 – 2021-11-05 (×3): 0 [IU] via SUBCUTANEOUS
  Administered 2021-11-05: 15 [IU] via SUBCUTANEOUS

## 2021-11-04 NOTE — Inpatient Diabetes Management (Signed)
Inpatient Diabetes Program Recommendations  AACE/ADA: New Consensus Statement on Inpatient Glycemic Control (2015)  Target Ranges:  Prepandial:   less than 140 mg/dL      Peak postprandial:   less than 180 mg/dL (1-2 hours)      Critically ill patients:  140 - 180 mg/dL   Lab Results  Component Value Date   GLUCAP 302 (H) 11/04/2021   HGBA1C 8.1 (H) 11/03/2021    Review of Glycemic Control  Latest Reference Range & Units 11/03/21 20:41 11/03/21 22:37 11/04/21 03:34 11/04/21 07:35 11/04/21 11:19 11/04/21 15:22  Glucose-Capillary 70 - 99 mg/dL 133 (H) 296 (H) 278 (H) 380 (H) 363 (H) 302 (H)   Diabetes history: DM 2 Outpatient Diabetes medications: Medtronic 770 G insulin pump, guardian sensor Current orders for Inpatient glycemic control: Levemir 17 units bid Novolog 0-15 units tid + hs + 0200  A1c 8.4% 4 weeks ago at Endocrinology appt. Pt sees Dr. Chalmers Cater, Endocrinologist, outpt for diabetes management  Spoke with pt over the phone. Glucose trends higher than goal. Pt reports when he is not working his glucose trends run higher. Pt reports having a follow up Next week. Pt currently has his insulin pump site and continuous glucose monitor on the right side of his abd. Pt initiated site this am.   Thanks,  Tama Headings RN, MSN, BC-ADM Inpatient Diabetes Coordinator Team Pager [phone] (8a-5p)

## 2021-11-04 NOTE — Progress Notes (Signed)
  Transition of Care (TOC) Screening Note   Patient Details  Name: [name] Date of Birth: [birth year]   Transition of Care [facility]) CM/SW Contact:    Ihor Gully, LCSW Phone Number: 11/04/2021, 3:42 PM    Transition of Care Department [facility]) has reviewed patient and no TOC needs have been identified at this time. We will continue to monitor patient advancement through interdisciplinary progression rounds. If new patient transition needs arise, please place a TOC consult.

## 2021-11-04 NOTE — Assessment & Plan Note (Signed)
-  replete electrolytes and follow trend.

## 2021-11-04 NOTE — Assessment & Plan Note (Signed)
-  Body mass index is 35.32 kg/m. -Low calorie diet, increase physical activity and portion control discussed with patient.

## 2021-11-04 NOTE — Progress Notes (Signed)
Progress Note   Patient: [name] OFB:[id number] DOB: Sep 22, 1970 DOA: 11/03/2021     1 DOS: the patient was seen and examined on 11/04/2021   Brief hospital patient course: BLANCHE GALLIEN is a 51 y.o. male with medical history significant of type 2 diabetes (with nephropathy) insulin-dependent; hypertension, hyperlipidemia, class II obesity, sleep apnea, after esophageal reflux disease and chronic kidney disease stage II; who presented to the hospital secondary to nausea/vomiting, associated general malaise and chest discomfort.  Symptoms present for approximately 12 hours prior to admission.  Patient reports being compliant with medication.  He has not noticed any increase in his blood sugar that despite the use of insulin continue to go up.  He became sick, experienced nausea, discomfort in his mid epigastric area/chest, nonradiated, dull in nature and presented to the emergency department for evaluation and management. Patient reports no fever, no dysuria, no hematemesis, no melena, no hematochezia, no shortness of breath, diaphoresis, focal weakness or any other complaints   In the ED work-up demonstrated no acute cardiopulmonary process; negative troponins x2, no EKG changes suggesting ischemia and work-up demonstrating hyperkalemia, elevated blood sugar and meeting DKA process.  Patient found to be dehydrated, tachycardic and with signs of acute kidney injury.  IV fluids and insulin drip initiated.  TRH contacted to place in the hospital for further evaluation and management.    Assessment and Plan: * DKA (diabetic ketoacidosis) ([place]) -Probably in the setting of medication noncompliance and diet indiscretion; no acute infection appreciated.   -EKG not demonstrating acute ischemic changes and negative troponin ruling out infarction. -Excellent response to insulin drip, n.p.o. status and aggressive fluid resuscitation. -Successfully transition to sliding scale and long-acting; now with plans to  initiate home insulin pump. -CBGs still fluctuating in the 180s 300 range. -Patient denies nausea or vomiting. -Stable electrolytes. -Modified carbohydrate diet has been encouraged. -A1c 8.1   Hypophosphatemia -replete electrolytes and follow trend.  Obesity, Class II, BMI 35-39.9 -Body mass index is 35.32 kg/m. -Low calorie diet, increase physical activity and portion control discussed with patient.  HTN (hypertension) -Stable overall -Continue holding ARB agents in the setting of acute kidney injury -Follow vital signs and will use hydralazine if needed.  GERD (gastroesophageal reflux disease) -Continue PPI.  Hyperkalemia - Calcium gluconate and sodium bicarbonate provided at time of admission. -potassium level stabilized and corrected; currently 4.3   Prolonged QT interval - Avoid medications that can further prolong QT -Continue to follow electrolytes and replete as needed.  Hyperlipidemia -Continue statins. -Heart healthy diet discussed with patient.  Tobacco abuse -Cessation counseling provided -Nicotine patch ordered.  CKD (chronic kidney disease), stage II - Stage II at baseline -Prerenal azotemia and dehydration most likely responsible for acute kidney injury component. -Creatinine usually in the 1.1 range -Currently 1.33 -Continue aggressive fluid resuscitation -Minimize/avoid the use of nephrotoxic agents -Follow renal function trend and electrolytes.  AKI (acute kidney injury) ([place]) -In the setting of prerenal azotemia -No signs of acute infection; continue to maintain adequate hydration.  Renal function adequately improving.   Pseudohyponatremia -In the setting of hyperglycemia and very likely some dehydration -Continue to maintain adequate fluid resuscitation/hydration -Follow electrolytes trend.     Subjective:  Afebrile, no chest pain, no nausea, no vomiting.  Reports feeling better.  CBGs still elevated after transitioning him off  insulin drip (mainly driven by his diet and food choices); creatinine still not back to baseline.  Denies dysuria.  Good saturation on room air.  Physical Exam: Vitals:  11/04/21 0750 11/04/21 0800 11/04/21 0900 11/04/21 1107  BP:  (!) 151/57 (!) 137/54   Pulse:      Resp: (!) 28     Temp: 98.7 F (37.1 C)   98 F (36.7 C)  TempSrc: Oral   Oral  SpO2:      Weight:      Height:       General exam: Alert, awake, oriented x 3; no chest pain, no nausea, no vomiting, no shortness of breath. Respiratory system: Clear to auscultation. Respiratory effort normal.  Good saturation on room air; normal effort. Cardiovascular system:RRR. No murmurs, rubs, gallops.  No JVD. Gastrointestinal system: Abdomen is nondistended, soft and nontender. No organomegaly or masses felt. Normal bowel sounds heard. Central nervous system: Alert and oriented. No focal neurological deficits. Extremities: No cyanosis, clubbing or edema. Skin: No petechiae. Psychiatry: Judgement and insight appear normal. Mood & affect appropriate.   Data Reviewed: CBGs in the 150-360. (Transition off insulin drip and now was started on his insulin pump) Basic metabolic panel demonstrating sodium 141, potassium 4.3, chloride 109, bicarb 26, BUN 25, creatinine 1.33 and anion gap 6 Phosphorus 2.1   Family Communication: Wife at bedside.  Disposition: Status is: Inpatient Remains inpatient appropriate because: Still requiring IV fluids and close monitoring of renal function which is now back to baseline yet; continue adjusting insulin therapy after transitioning patient off insulin drip for DKA.   Planned Discharge Destination: Home   Author: Barton Dubois, MD 11/04/2021 3:35 PM  For on call review [email or web address].

## 2021-11-05 DIAGNOSIS — Z794 Long term (current) use of insulin: Secondary | ICD-10-CM

## 2021-11-05 DIAGNOSIS — N182 Chronic kidney disease, stage 2 (mild): Secondary | ICD-10-CM | POA: Diagnosis not present

## 2021-11-05 DIAGNOSIS — E1110 Type 2 diabetes mellitus with ketoacidosis without coma: Secondary | ICD-10-CM | POA: Diagnosis not present

## 2021-11-05 DIAGNOSIS — R079 Chest pain, unspecified: Secondary | ICD-10-CM

## 2021-11-05 DIAGNOSIS — N179 Acute kidney failure, unspecified: Secondary | ICD-10-CM | POA: Diagnosis not present

## 2021-11-05 DIAGNOSIS — E1169 Type 2 diabetes mellitus with other specified complication: Secondary | ICD-10-CM

## 2021-11-05 LAB — BASIC METABOLIC PANEL
Anion gap: 10 (ref 5–15)
BUN: 21 mg/dL — ABNORMAL HIGH (ref 6–20)
CO2: 23 mmol/L (ref 22–32)
Calcium: 8.4 mg/dL — ABNORMAL LOW (ref 8.9–10.3)
Chloride: 103 mmol/L (ref 98–111)
Creatinine, Ser: 1.21 mg/dL (ref 0.61–1.24)
GFR, Estimated: >60 mL/min (ref >60)
Glucose, Bld: 466 mg/dL — ABNORMAL HIGH (ref 70–99)
Potassium: 4.6 mmol/L (ref 3.5–5.1)
Sodium: 136 mmol/L (ref 135–145)

## 2021-11-05 LAB — GLUCOSE, CAPILLARY
Glucose-Capillary: 385 mg/dL — ABNORMAL HIGH (ref 70–99)
Glucose-Capillary: 497 mg/dL — ABNORMAL HIGH (ref 70–99)
Glucose-Capillary: 355 mg/dL — ABNORMAL HIGH (ref 70–99)

## 2021-11-05 LAB — PHOSPHORUS: Phosphorus: 3 mg/dL (ref 2.5–4.6)

## 2021-11-05 LAB — MAGNESIUM: Magnesium: 1.5 mg/dL — ABNORMAL LOW (ref 1.7–2.4)

## 2021-11-05 MED ORDER — LOSARTAN POTASSIUM 100 MG PO TABS: 100.0000 mg | ORAL_TABLET | Freq: Every day | ORAL | Status: AC

## 2021-11-05 MED ORDER — INSULIN DETEMIR 100 UNIT/ML ~~LOC~~ SOLN
12.0000 [IU] | Freq: Once | SUBCUTANEOUS | Status: AC
  Administered 2021-11-05: 12 [IU] via SUBCUTANEOUS
  Filled 2021-11-05: qty 0.12

## 2021-11-05 MED ORDER — SIMVASTATIN 40 MG PO TABS: 40.0000 mg | ORAL_TABLET | Freq: Every day | ORAL | 3 refills | Status: AC

## 2021-11-05 NOTE — Assessment & Plan Note (Signed)
-  most likely esophagitis from vomiting vs muscle spasm in the setting of hyperkalemia.

## 2021-11-05 NOTE — Inpatient Diabetes Management (Signed)
Inpatient Diabetes Program Recommendations  AACE/ADA: New Consensus Statement on Inpatient Glycemic Control (2015)  Target Ranges:  Prepandial:   less than 140 mg/dL      Peak postprandial:   less than 180 mg/dL (1-2 hours)      Critically ill patients:  140 - 180 mg/dL   Lab Results  Component Value Date   GLUCAP 497 (H) 11/05/2021   HGBA1C 8.1 (H) 11/03/2021    Review of Glycemic Control  Latest Reference Range & Units 11/04/21 07:35 11/04/21 11:19 11/04/21 15:22 11/04/21 21:53 11/05/21 02:38 11/05/21 07:13  Glucose-Capillary 70 - 99 mg/dL 380 (H) 363 (H) 302 (H) 349 (H) 385 (H) 497 (H)   Diabetes history: DM 2 Outpatient Diabetes medications: Medtronic 770 G insulin pump, guardian sensor Insulin pump settings Basal in 24 hour period: 26.2 units Sensitivity: 1 units drops glucose 30 mg/dl Target glucose 120 Active insulin time 3 hours Insulin to carb ratio: 1 units for every 8 grams of carbs  Current orders for Inpatient glycemic control: Novolog 0-15 units tid + hs + 0200  A1c 8.4% 4 weeks ago at Endocrinology appt. Pt sees Dr. Chalmers Cater, Endocrinologist, outpt for diabetes management  Glucose trends very elevated at 497 this am. Pt is not concerned due to his inactivity here at the hospital. Pt has a desire to be discharged today. Spoke with Dr. Dyann Kief about extra insulin dosing to bring down glucose levels in addition to pt pump settings, which are not controlling current levels. Pt wife called pump trainer for possible adjustment in settings moving forward.   Thanks,  Tama Headings RN, MSN, BC-ADM Inpatient Diabetes Coordinator Team Pager [phone] (8a-5p)

## 2021-11-05 NOTE — Progress Notes (Signed)
Cardiology Office Note    Date:  11/18/2021   ID:  [name], DOB [birth year], MRN [id number]   PCP:  Sharilyn Sites, [place]  Cardiologist:  Rozann Lesches, MD   Advanced Practice Provider:  No care team member to display Electrophysiologist:  None   [phone]   Chief Complaint  Patient presents with   Hospitalization Follow-up    History of Present Illness:  [name] is a 51 y.o. male with history of DOE with low risk myoview 2019,HTN, HLD, DM2 on insulin pump.   Patient was discharged 11/05/21 after hospitalization with DKA, dehydration, tachycardia and AKI. He had chest pain but troponins negative and EKG no ischemia. Felt secondarty to esophagitis from vomiting vs muscle spasm from  hyperkalemia.  Patient comes in for f/u. Denies any further chest pain. Continues to smoke 2 pks/day. Works loading trucks with a Forensic scientist. His wife thinks he needs an echo.   Past Medical History:  Diagnosis Date   CKD (chronic kidney disease), stage II    Deafness    Left ear only. Congenital.   GERD (gastroesophageal reflux disease)    History of influenza    Hyperlipidemia    Hypertension    Sleep apnea    Thrombocytopenia (HCC)    Type 2 diabetes mellitus ([place])    Vitamin B12 deficiency     Past Surgical History:  Procedure Laterality Date   COLONOSCOPY WITH PROPOFOL N/A 01/04/2020   Procedure: COLONOSCOPY WITH PROPOFOL;  Surgeon: Rogene Houston, MD;  Location: [facility];  Service: Endoscopy;  Laterality: N/A;  [place]     Insulin   NASAL SEPTUM SURGERY     POLYPECTOMY  01/04/2020   Procedure: POLYPECTOMY;  Surgeon: Rogene Houston, MD;  Location: [facility];  Service: Endoscopy;;    Current Medications: Current Meds  Medication Sig   HUMALOG 100 UNIT/ML injection Inject 100 Units into the skin See admin instructions. Pump   losartan (COZAAR) 100 MG tablet Take 1 tablet (100 mg  total) by mouth daily.   Multiple Vitamins-Minerals (CENTRUM PO) Take 1 tablet by mouth daily.   pantoprazole (PROTONIX) 40 MG tablet Take 40 mg daily by mouth.   simvastatin (ZOCOR) 40 MG tablet Take 1 tablet (40 mg total) by mouth daily at 6 PM.     Allergies:   Codeine and Glucophage [metformin hydrochloride]   Social History   Socioeconomic History   Marital status: Married    Spouse name: Not on file   Number of children: Not on file   Years of education: Not on file   Highest education level: Not on file  Occupational History   Not on file  Tobacco Use   Smoking status: Every Day    Packs/day: 1.50    Years: 27.00    Total pack years: 40.50    Types: Cigarettes   Smokeless tobacco: Never  Vaping Use   Vaping Use: Never used  Substance and Sexual Activity   Alcohol use: Yes    Comment: Occasional   Drug use: No   Sexual activity: Not on file  Other Topics Concern   Not on file  Social History Narrative   Not on file   Social Determinants of Health   Financial Resource Strain: Not on file  Food Insecurity: Not on file  Transportation Needs: Not on file  Physical Activity: Not on  file  Stress: Not on file  Social Connections: Not on file     Family History:  The patient's  family history includes Heart disease in his father; Hypertension in his father and mother.   ROS:   Please see the history of present illness.    ROS All other systems reviewed and are negative.   PHYSICAL EXAM:   VS:  BP 102/74 (BP Location: Right Arm, Patient Position: Sitting, Cuff Size: Large)   Pulse 91   Ht '5\' 4"'$  (1.626 m)   Wt 189 lb (85.7 kg)   SpO2 95%   BMI 32.44 kg/m   Physical Exam  GEN: Well nourished, well developed, in no acute distress  Neck: no JVD, carotid bruits, or masses Cardiac:RRR; no murmurs, rubs, or gallops  Respiratory:  clear to auscultation bilaterally, normal work of breathing GI: soft, nontender, nondistended, + BS Ext: without cyanosis,  clubbing, or edema, Good distal pulses bilaterally Neuro:  Alert and Oriented x 3,  Psych: euthymic mood, full affect  Wt Readings from Last 3 Encounters:  11/18/21 189 lb (85.7 kg)  11/04/21 193 lb 1.6 oz (87.6 kg)  04/17/20 187 lb 6.3 oz (85 kg)      Studies/Labs Reviewed:   EKG:  EKG is not ordered today.     Recent Labs: 11/03/2021: ALT 37; Hemoglobin 16.2; Platelets 199 11/05/2021: BUN 21; Creatinine, Ser 1.21; Magnesium 1.5; Potassium 4.6; Sodium 136   Lipid Panel No results found for: "CHOL", "TRIG", "HDL", "CHOLHDL", "VLDL", "LDLCALC", "LDLDIRECT"  Additional studies/ records that were reviewed today include:      Risk Assessment/Calculations:         ASSESSMENT:    1. Chest pain of uncertain etiology   2. DOE (dyspnea on exertion)   3. Essential hypertension   4. Hyperlipidemia, unspecified hyperlipidemia type   5. Diabetic ketoacidosis without coma associated with type 2 diabetes mellitus ([place])   6. Tobacco abuse      PLAN:  In order of problems listed above:  Recent admission with chest pain, troponins negative felt due to esophagitis from vomiting vs M-S. Will order echo.  DOE with low risk myoview 2019, no regular exercise.   HTN BP controlled  HLD on statin managed by PCP  DM2 on insulin pump-recent admission for DKA  Tobacco abuse-still smokes 2 ppd. Smoking cessation discussed-has tried to quite in the past but not willing at this time. Declined treatment.  Shared Decision Making/Informed Consent        Medication Adjustments/Labs and Tests Ordered: Current medicines are reviewed at length with the patient today.  Concerns regarding medicines are outlined above.  Medication changes, Labs and Tests ordered today are listed in the Patient Instructions below. Patient Instructions  Medication Instructions:  Your physician recommends that you continue on your current medications as directed. Please refer to the Current Medication list given to  you today.  *If you need a refill on your cardiac medications before your next appointment, please call your pharmacy*   Lab Work: NONE   If you have labs (blood work) drawn today and your tests are completely normal, you will receive your results only by: [place] (if you have MyChart) OR A paper copy in the mail If you have any lab test that is abnormal or we need to change your treatment, we will call you to review the results.   Testing/Procedures: Your physician has requested that you have an echocardiogram. Echocardiography is a painless test that uses sound waves  to create images of your heart. It provides your doctor with information about the size and shape of your heart and how well your heart's chambers and valves are working. This procedure takes approximately one hour. There are no restrictions for this procedure.    Follow-Up: At [facility], you and your health needs are our priority.  As part of our continuing mission to provide you with exceptional heart care, we have created designated Provider Care Teams.  These Care Teams include your primary Cardiologist (physician) and Advanced Practice Providers (APPs -  Physician Assistants and Nurse Practitioners) who all work together to provide you with the care you need, when you need it.  We recommend signing up for the patient portal called "MyChart".  Sign up information is provided on this After Visit Summary.  MyChart is used to connect with patients for Virtual Visits (Telemedicine).  Patients are able to view lab/test results, encounter notes, upcoming appointments, etc.  Non-urgent messages can be sent to your provider as well.   To learn more about what you can do with MyChart, go to [email or web address].    Your next appointment:   6 month(s)  The format for your next appointment:   In Person  Provider:   Rozann Lesches, MD    Other Instructions Thank you for choosing [facility]!    Important Information About Sugar         Sumner Boast, PA-C  11/18/2021 8:59 AM    [facility] [place], [place], [place]  [address] Phone: [phone]; Fax: [phone]

## 2021-11-05 NOTE — Discharge Summary (Signed)
Physician Discharge Summary   Patient: [name] MRN: [id number] DOB: 1970-11-08  Admit date:     11/03/2021  Discharge date: 11/05/21  Discharge Physician: Barton Dubois   PCP: Sharilyn Sites, MD   Recommendations at discharge:  Close monitoring to patient's insulin pump adjustment to further titrate deliver insulin and accomplish better coverage. -Continue assisting patient with diet management and changes in order to facilitate better sugar control. Repeat basic metabolic panel to follow electrolytes and renal function Reassess blood pressure with further adjustment to antihypertensive regimen as needed.  Discharge Diagnoses: Principal Problem:   DKA (diabetic ketoacidosis) ([place]) Active Problems:   Pseudohyponatremia   AKI (acute kidney injury) ([place])   CKD (chronic kidney disease), stage II   Tobacco abuse   Hyperlipidemia   Prolonged QT interval   Hyperkalemia   GERD (gastroesophageal reflux disease)   HTN (hypertension)   Obesity, Class II, BMI 35-39.9   Hypophosphatemia   Chest pain  Brief hospital patient course: [name] is a 51 y.o. male with medical history significant of type 2 diabetes (with nephropathy) insulin-dependent; hypertension, hyperlipidemia, class II obesity, sleep apnea, after esophageal reflux disease and chronic kidney disease stage II; who presented to the hospital secondary to nausea/vomiting, associated general malaise and chest discomfort.  Symptoms present for approximately 12 hours prior to admission.  Patient reports being compliant with medication.  He has not noticed any increase in his blood sugar that despite the use of insulin continue to go up.  He became sick, experienced nausea, discomfort in his mid epigastric area/chest, nonradiated, dull in nature and presented to the emergency department for evaluation and management. Patient reports no fever, no dysuria, no hematemesis, no melena, no hematochezia, no shortness of breath, diaphoresis,  focal weakness or any other complaints   In the ED work-up demonstrated no acute cardiopulmonary process; negative troponins x2, no EKG changes suggesting ischemia and work-up demonstrating hyperkalemia, elevated blood sugar and meeting DKA process.  Patient found to be dehydrated, tachycardic and with signs of acute kidney injury.  IV fluids and insulin drip initiated.  TRH contacted to place in the hospital for further evaluation and management.  Assessment and Plan: * DKA (diabetic ketoacidosis) ([place]) -Probably in the setting of medication noncompliance and diet indiscretion; no acute infection appreciated.   -EKG not demonstrating acute ischemic changes and negative troponin ruling out infarction. -Excellent response to insulin drip, n.p.o. status and aggressive fluid resuscitation. -Successfully transition to sliding scale and long-acting; now with plans to initiate home insulin pump. -CBGs still fluctuating, and at some point up to 400 range. -Basal insulin at the moment provided to prevent any future derangement and chances to get him into DKA again. -Insulin pump dose adjusted and instructions to maintain adequate hydration and follow modified carbohydrate diet provided. -Patient will follow-up with PCP and his endocrinologist/insulin pump provider in order to continue adjusting and titrating insulin management.   -Patient denies nausea or vomiting. -Stable electrolytes. -A1c 8.1   Chest pain -most likely esophagitis from vomiting vs muscle spasm in the setting of hyperkalemia.  Hypophosphatemia -replete electrolytes and follow trend.  Obesity, Class II, BMI 35-39.9 -Body mass index is 35.32 kg/m. -Low calorie diet, increase physical activity and portion control discussed with patient.  HTN (hypertension) -Stable overall -Continue holding ARB agents in the setting of acute kidney injury for another 24 hours after discharge prior to resuming it. -Heart healthy diet discussed  with patient. -I to obtain adequate hydration.  GERD (gastroesophageal reflux  disease) -Continue PPI.  Hyperkalemia -Calcium gluconate and sodium bicarbonate provided at time of admission. -potassium level stabilized and corrected; potassium WNL at discharge.   Prolonged QT interval - Avoid medications that can further prolong QT -Continue to follow electrolytes and replete as needed.  Hyperlipidemia -Continue statins. -Heart healthy diet discussed with patient.  Tobacco abuse -Cessation counseling provided -Nicotine patch ordered, but declined..  CKD (chronic kidney disease), stage II -Stage II at baseline -Prerenal azotemia and dehydration most likely responsible for acute kidney injury component. -Creatinine usually in the 1.1-1.2 range -At discharge 1.2 creatinine appreciated. -Patient advised to maintain adequate hydration and instructed to hold his losartan for another 24 hours prior to resumption. -Recommending reassess basic metabolic panel follow-up visit to assess renal function and stability.  AKI (acute kidney injury) ([place]) -In the setting of prerenal azotemia -No signs of acute infection; continue to maintain adequate hydration.   -Renal function adequately improving/resolving and back to baseline at discharge..   Pseudohyponatremia -In the setting of hyperglycemia and very likely some dehydration -Continue to maintain adequate hydration. -Corrected sodium level within normal limits at discharge. -Repeat basic metabolic panel follow-up visit to follow electrolytes trend.    Consultants: Diabetes coordinator Procedures performed: See below for x-ray reports. Disposition: Home Diet recommendation: Heart healthy modified carbohydrate diet.  DISCHARGE MEDICATION: Allergies as of 11/05/2021       Reactions   Codeine Nausea Only   Drowsiness   Glucophage [metformin Hydrochloride] Other (See Comments)   Vomiting and cramps        Medication List      STOP taking these medications    rosuvastatin 40 MG tablet Commonly known as: CRESTOR       TAKE these medications    CENTRUM PO Take 1 tablet by mouth daily.   HumaLOG 100 UNIT/ML injection Generic drug: insulin lispro Inject 100 Units into the skin See admin instructions. Pump   losartan 100 MG tablet Commonly known as: COZAAR Take 1 tablet (100 mg total) by mouth daily. Start taking on: November 06, 2021   pantoprazole 40 MG tablet Commonly known as: PROTONIX Take 40 mg daily by mouth.   simvastatin 40 MG tablet Commonly known as: ZOCOR Take 1 tablet (40 mg total) by mouth daily at 6 PM.        Follow-up Information     Sharilyn Sites, MD. Schedule an appointment as soon as possible for a visit in 10 day(s).   Specialty: Family Medicine Contact information: [place] [phone]         Satira Sark, MD .   Specialty: Cardiology Contact information: [place]  [phone]                Discharge Exam: Filed Weights   11/03/21 0313 11/03/21 0916 11/04/21 0600  Weight: 85.7 kg 87.9 kg 87.6 kg   General exam: Alert, awake, oriented x 3 Respiratory system: Clear to auscultation. Respiratory effort normal. Cardiovascular system:RRR. No murmurs, rubs, gallops. Gastrointestinal system: Abdomen is obese, nondistended, soft and nontender. No organomegaly or masses felt. Normal bowel sounds heard. Central nervous system: Alert and oriented. No focal neurological deficits. Extremities: No C/C/E, +pedal pulses Skin: No rashes, lesions or ulcers Psychiatry: Judgement and insight appear normal. Mood & affect appropriate.    Condition at discharge: Stable and improved.  The results of significant diagnostics from this hospitalization (including imaging, microbiology, ancillary and laboratory) are listed below for reference.   Imaging  Studies: DG Chest Port 1 View  Result Date:  11/03/2021 CLINICAL DATA:  Chest pain EXAM: PORTABLE CHEST 1 VIEW COMPARISON:  04/15/2020 FINDINGS: Low volume chest with interstitial crowding. There is no edema, consolidation, effusion, or pneumothorax. Normal heart size and stable mediastinal contours. Artifact from EKG leads. IMPRESSION: Low volume chest without acute finding Electronically Signed   By: Jorje Guild M.D.   On: 11/03/2021 05:20    Microbiology: Results for orders placed or performed during the hospital encounter of 11/03/21  MRSA Next Gen by PCR, Nasal     Status: None   Collection Time: 11/03/21  8:22 AM   Specimen: Nasal Mucosa; Nasal Swab  Result Value Ref Range Status   MRSA by PCR Next Gen NOT DETECTED NOT DETECTED Final    Comment: (NOTE) The GeneXpert MRSA Assay (FDA approved for NASAL specimens only), is one component of a comprehensive MRSA colonization surveillance program. It is not intended to diagnose MRSA infection nor to guide or monitor treatment for MRSA infections. Test performance is not FDA approved in patients less than 9 years old. Performed at [facility], [address], [place], [place] [address]     Labs: CBC: Recent Labs  Lab 11/03/21 0336  WBC 17.3*  NEUTROABS 14.9*  HGB 16.2  HCT 49.5  MCV 90.8  PLT 034   Basic Metabolic Panel: Recent Labs  Lab 11/03/21 0805 11/03/21 1132 11/03/21 1518 11/04/21 0352 11/05/21 0347  NA 137 139 140 141 136  K 3.7 3.5 3.4* 4.3 4.6  CL 104 106 108 109 103  CO2 21* '26 27 26 23  '$ GLUCOSE 559* 315* 117* 266* 466*  BUN 33* 29* 28* 25* 21*  CREATININE 1.79* 1.57* 1.36* 1.33* 1.21  CALCIUM 9.1 9.2 8.9 8.6* 8.4*  MG 2.1  --   --   --  1.5*  PHOS <1.0*  --   --  2.1* 3.0   Liver Function Tests: Recent Labs  Lab 11/03/21 0336  AST 26  ALT 37  ALKPHOS 87  BILITOT 1.7*  PROT 7.8  ALBUMIN 4.6   CBG: Recent Labs  Lab 11/04/21 1522 11/04/21 2153 11/05/21 0238 11/05/21 0713 11/05/21 1124  GLUCAP 302* 349* 385* 497* 355*     Discharge time spent: greater than 30 minutes.  Signed: Barton Dubois, MD [facility] 11/05/2021

## 2021-11-18 ENCOUNTER — Encounter: Payer: Self-pay | Admitting: Physician Assistant

## 2021-11-18 ENCOUNTER — Ambulatory Visit: Payer: 59 | Admitting: Physician Assistant

## 2021-11-18 VITALS — BP 102/74 | HR 91 | Ht 64.0 in | Wt 189.0 lb

## 2021-11-18 DIAGNOSIS — E785 Hyperlipidemia, unspecified: Secondary | ICD-10-CM

## 2021-11-18 DIAGNOSIS — R0609 Other forms of dyspnea: Secondary | ICD-10-CM

## 2021-11-18 DIAGNOSIS — R079 Chest pain, unspecified: Secondary | ICD-10-CM

## 2021-11-18 DIAGNOSIS — I10 Essential (primary) hypertension: Secondary | ICD-10-CM

## 2021-11-18 DIAGNOSIS — E1110 Type 2 diabetes mellitus with ketoacidosis without coma: Secondary | ICD-10-CM

## 2021-11-18 DIAGNOSIS — Z720 Tobacco use: Secondary | ICD-10-CM

## 2021-11-18 NOTE — Patient Instructions (Signed)
Medication Instructions:  Your physician recommends that you continue on your current medications as directed. Please refer to the Current Medication list given to you today.  *If you need a refill on your cardiac medications before your next appointment, please call your pharmacy*   Lab Work: NONE   If you have labs (blood work) drawn today and your tests are completely normal, you will receive your results only by: [place] (if you have MyChart) OR A paper copy in the mail If you have any lab test that is abnormal or we need to change your treatment, we will call you to review the results.   Testing/Procedures: Your physician has requested that you have an echocardiogram. Echocardiography is a painless test that uses sound waves to create images of your heart. It provides your doctor with information about the size and shape of your heart and how well your heart's chambers and valves are working. This procedure takes approximately one hour. There are no restrictions for this procedure.    Follow-Up: At [facility], you and your health needs are our priority.  As part of our continuing mission to provide you with exceptional heart care, we have created designated Provider Care Teams.  These Care Teams include your primary Cardiologist (physician) and Advanced Practice Providers (APPs -  Physician Assistants and Nurse Practitioners) who all work together to provide you with the care you need, when you need it.  We recommend signing up for the patient portal called "MyChart".  Sign up information is provided on this After Visit Summary.  MyChart is used to connect with patients for Virtual Visits (Telemedicine).  Patients are able to view lab/test results, encounter notes, upcoming appointments, etc.  Non-urgent messages can be sent to your provider as well.   To learn more about what you can do with MyChart, go to [email or web address].    Your next appointment:   6  month(s)  The format for your next appointment:   In Person  Provider:   Rozann Lesches, MD    Other Instructions Thank you for choosing [facility]!    Important Information About Sugar

## 2021-12-02 ENCOUNTER — Ambulatory Visit (HOSPITAL_COMMUNITY)
Admission: RE | Admit: 2021-12-02 | Discharge: 2021-12-02 | Disposition: A | Discharge status: Home / Self Care | Payer: 59 | Source: Ambulatory Visit | Attending: Physician Assistant | Admitting: Physician Assistant

## 2021-12-02 DIAGNOSIS — R079 Chest pain, unspecified: Secondary | ICD-10-CM | POA: Diagnosis not present

## 2021-12-02 LAB — ECHOCARDIOGRAM COMPLETE
AR max vel: 2.66 cm2
AV Area VTI: 2.49 cm2
AV Area mean vel: 2.21 cm2
AV Mean grad: 4 mmHg
AV Peak grad: 7.1 mmHg
Ao pk vel: 1.33 m/s
Area-P 1/2: 3.05 cm2
Calc EF: 64 %
MV VTI: 2.67 cm2
S' Lateral: 2.7 cm
Single Plane A2C EF: 68.3 %
Single Plane A4C EF: 54.5 %

## 2021-12-02 NOTE — Progress Notes (Signed)
*  PRELIMINARY RESULTS* Echocardiogram 2D Echocardiogram has been performed.  [name] 12/02/2021, 10:28 AM

## 2022-05-23 NOTE — Progress Notes (Deleted)
Cardiology Office Note  Date: 05/23/2022   ID: [name], DOB 09-04-1970, MRN [id number]  PCP:  Sharilyn Sites, MD  Cardiologist:  Rozann Lesches, MD Electrophysiologist:  None   No chief complaint on file.   History of Present Illness: [name] is a 52 y.o. male last seen in July 2023 by Ms. Vita Barley, I reviewed the note.  Past Medical History:  Diagnosis Date   CKD (chronic kidney disease), stage II    Deafness    Left ear only. Congenital.   GERD (gastroesophageal reflux disease)    History of influenza    Hyperlipidemia    Hypertension    Sleep apnea    Thrombocytopenia (HCC)    Type 2 diabetes mellitus (HCC)    Vitamin B12 deficiency     Current Outpatient Medications  Medication Sig Dispense Refill   HUMALOG 100 UNIT/ML injection Inject 100 Units into the skin See admin instructions. Pump     losartan (COZAAR) 100 MG tablet Take 1 tablet (100 mg total) by mouth daily.     Multiple Vitamins-Minerals (CENTRUM PO) Take 1 tablet by mouth daily.     pantoprazole (PROTONIX) 40 MG tablet Take 40 mg daily by mouth.  0   simvastatin (ZOCOR) 40 MG tablet Take 1 tablet (40 mg total) by mouth daily at 6 PM. 30 tablet 3   No current facility-administered medications for this visit.   Allergies:  Codeine and Glucophage [metformin hydrochloride]   ROS:  Please see the history of present illness. Otherwise, complete review of systems is positive for {NONE DEFAULTED:18576}.  All other systems are reviewed and negative.   Physical Exam: VS:  There were no vitals taken for this visit., BMI There is no height or weight on file to calculate BMI.  Wt Readings from Last 3 Encounters:  11/18/21 189 lb (85.7 kg)  11/04/21 193 lb 1.6 oz (87.6 kg)  04/17/20 187 lb 6.3 oz (85 kg)    General: Patient appears comfortable at rest. HEENT: Conjunctiva and lids normal, oropharynx clear with moist mucosa. Neck: Supple, no elevated JVP or carotid bruits, no thyromegaly. Lungs:  Clear to auscultation, nonlabored breathing at rest. Cardiac: Regular rate and rhythm, no S3 or significant systolic murmur, no pericardial rub. Abdomen: Soft, nontender, no hepatomegaly, bowel sounds present, no guarding or rebound. Extremities: No pitting edema, distal pulses 2+. Skin: Warm and dry. Musculoskeletal: No kyphosis. Neuropsychiatric: Alert and oriented x3, affect grossly appropriate.  ECG:  An ECG dated 11/03/2021 was personally reviewed today and demonstrated:  Sinus tachycardia.  Recent Labwork: 11/03/2021: ALT 37; AST 26; Hemoglobin 16.2; Platelets 199 11/05/2021: BUN 21; Creatinine, Ser 1.21; Magnesium 1.5; Potassium 4.6; Sodium 136  October 2023: Cholesterol 124, triglycerides 74, HDL 45, LDL 64, hemoglobin A1c 7.3% November 2023: BUN 16, creatinine 1.18, potassium 4.6, AST 25, ALT 41  Other Studies Reviewed Today:  Echocardiogram 12/02/2021:  1. Left ventricular ejection fraction, by estimation, is 60 to 65%. Left  ventricular ejection fraction by 2D MOD biplane is 64.0 %. The left  ventricle has normal function. The left ventricle has no regional wall  motion abnormalities. Left ventricular  diastolic parameters are consistent with Grade I diastolic dysfunction  (impaired relaxation).   2. Right ventricular systolic function is low normal. The right  ventricular size is normal. Tricuspid regurgitation signal is inadequate  for assessing PA pressure.   3. The mitral valve is grossly normal. Trivial mitral valve  regurgitation.   4. The aortic valve  is tricuspid. Aortic valve regurgitation is not  visualized.   5. The inferior vena cava is normal in size with greater than 50%  respiratory variability, suggesting right atrial pressure of 3 mmHg.    Assessment and Plan:   Medication Adjustments/Labs and Tests Ordered: Current medicines are reviewed at length with the patient today.  Concerns regarding medicines are outlined above.   Tests Ordered: No orders of  the defined types were placed in this encounter.   Medication Changes: No orders of the defined types were placed in this encounter.   Disposition:  Follow up {follow up:15908}  Signed, Satira Sark, MD, [facility] 05/23/2022 11:18 AM    [facility] at [facility] [address] [place], [place] [address] Phone: [phone]; Fax: [phone]

## 2022-05-26 ENCOUNTER — Ambulatory Visit: Payer: 59 | Admitting: Cardiology

## 2022-05-26 DIAGNOSIS — E782 Mixed hyperlipidemia: Secondary | ICD-10-CM

## 2022-07-08 IMAGING — CR DG CHEST 2V
2 series · 2 of 2 positions shown · non-contrast
Comparison: 04/10/2020

CLINICAL DATA: Short of breath

EXAM:
CHEST - 2 VIEW

[chest pa]
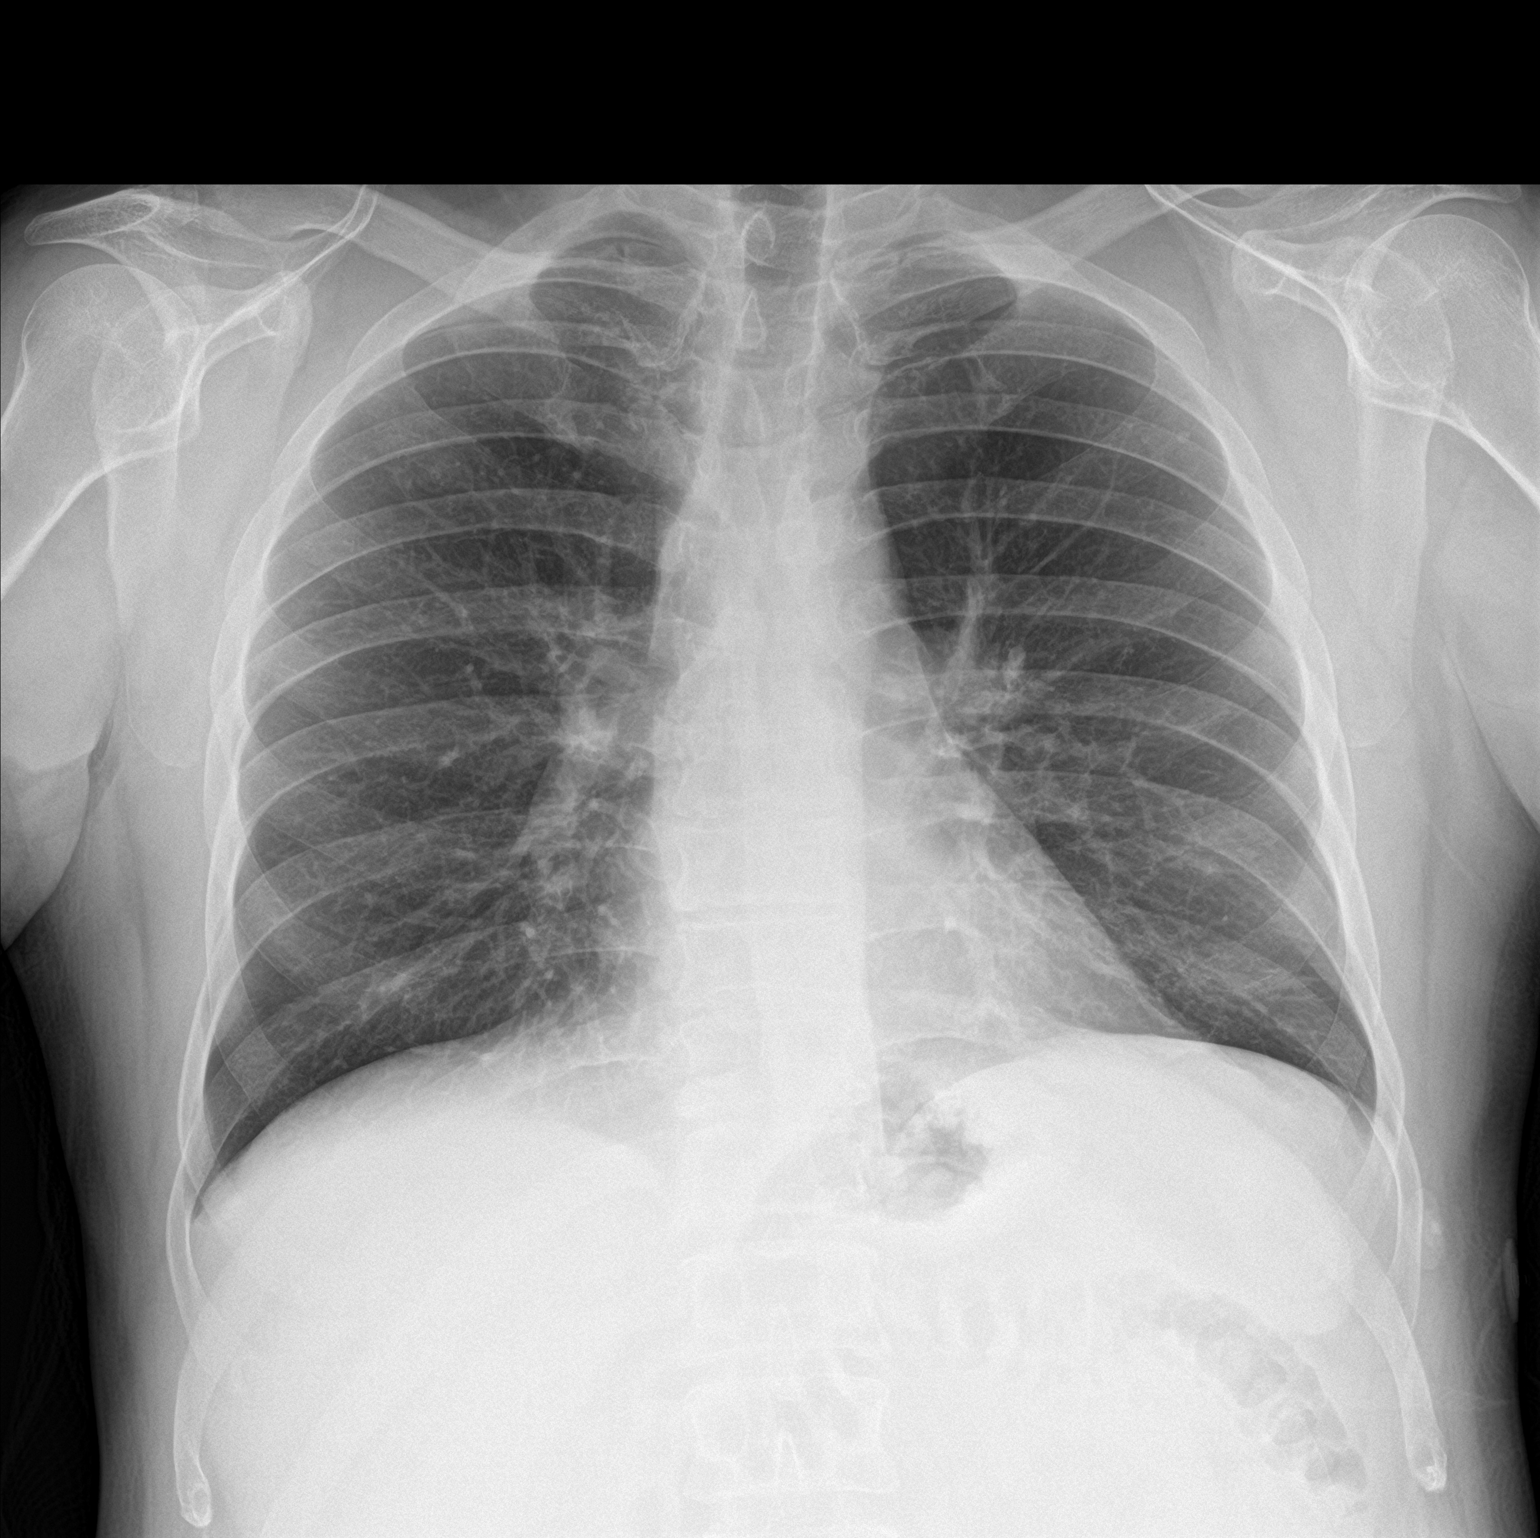

[chest lat]
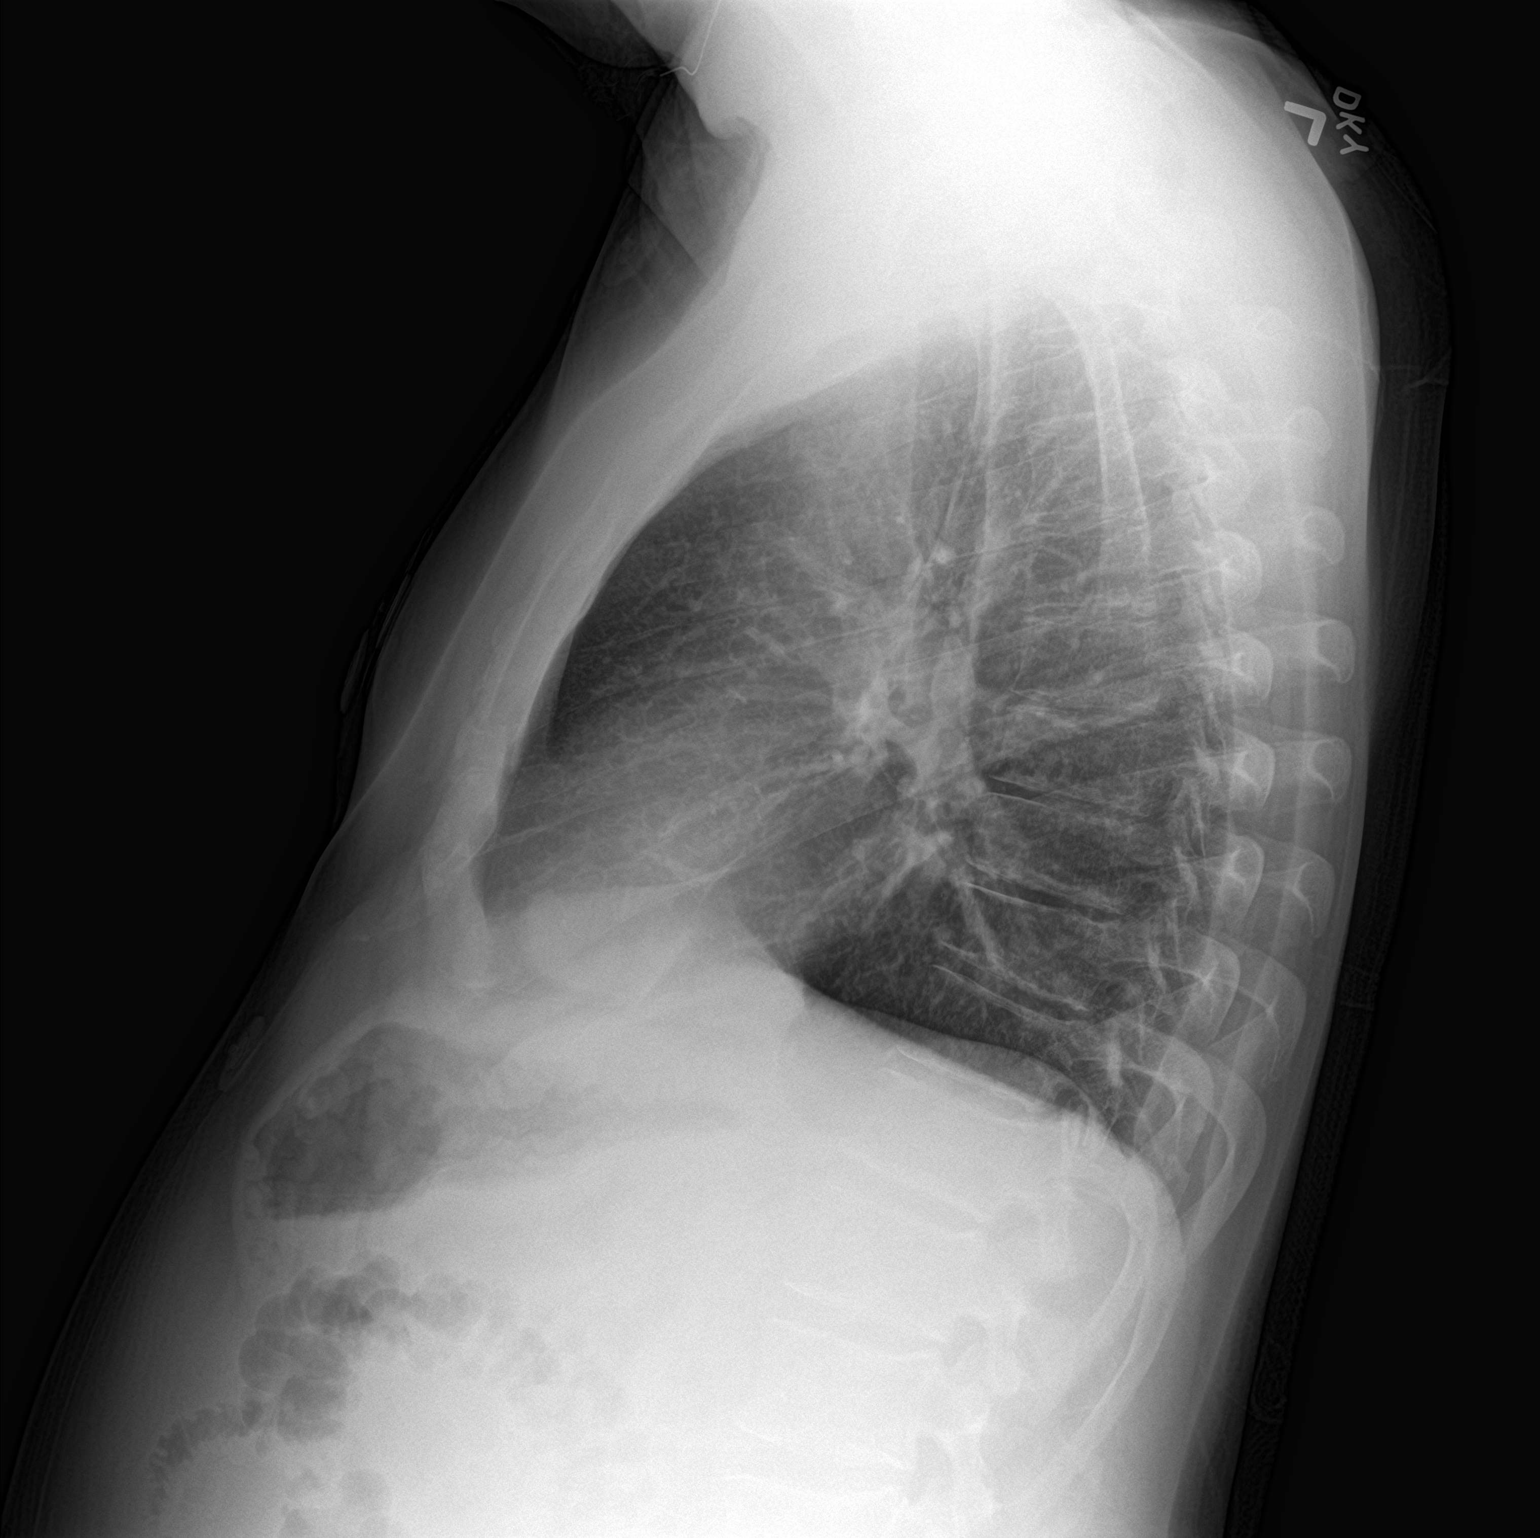

[2 of 2 positions shown; findings below may reference images not displayed]

FINDINGS: The heart size and mediastinal contours are within normal limits.
Both lungs are clear. The visualized skeletal structures are
unremarkable.
IMPRESSION: No active cardiopulmonary disease.

## 2022-07-11 IMAGING — CT CT ANGIO CHEST
2 of 6 series · 19 of 46 positions shown · IV contrast (Omnipaque or Isovue)
Comparison: 03/16/2017, 04/15/2020

CLINICAL DATA: Dyspnea on exertion, tachycardia, nausea and
vomiting, positive D dimer

EXAM:
CT ANGIOGRAPHY CHEST WITH CONTRAST
TECHNIQUE: Multidetector CT imaging of the chest was performed using the
standard protocol during bolus administration of intravenous
contrast. Multiplanar CT image reconstructions and MIPs were
obtained to evaluate the vascular anatomy.
CONTRAST:  100mL OMNIPAQUE IOHEXOL 350 MG/ML SOLN

[Series 5: pe axial thins · axial · 0.81mm/px · z∈[+994,+1302]mm · 16 of 338 slices shown]
[im 15/338  lung]
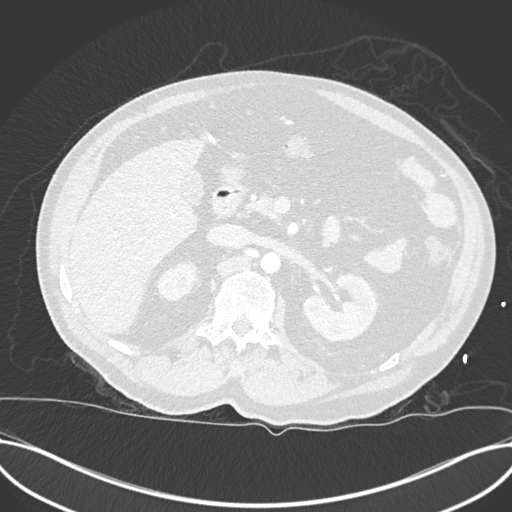
[im 44/338  soft-tissue]
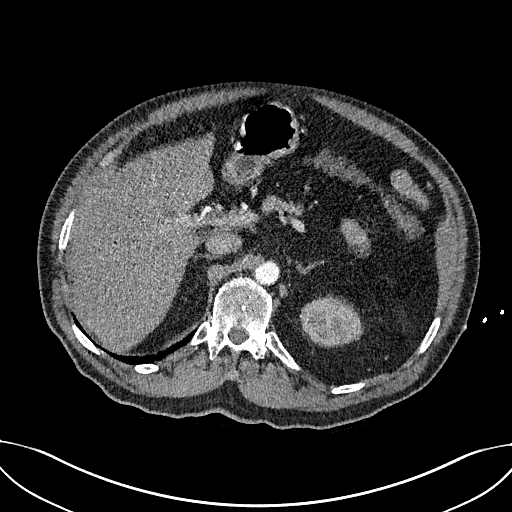
[im 59/338  lung]
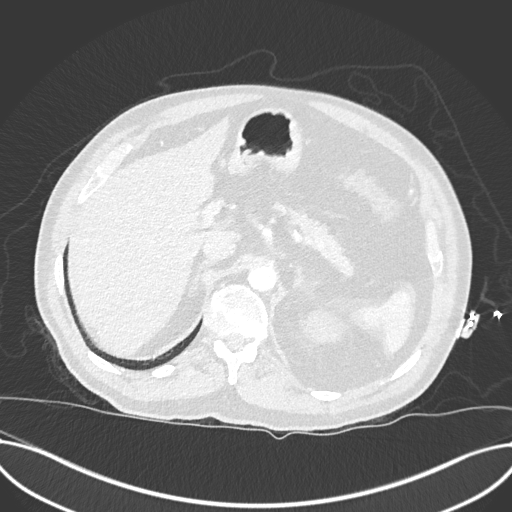
[im 74/338  soft-tissue]
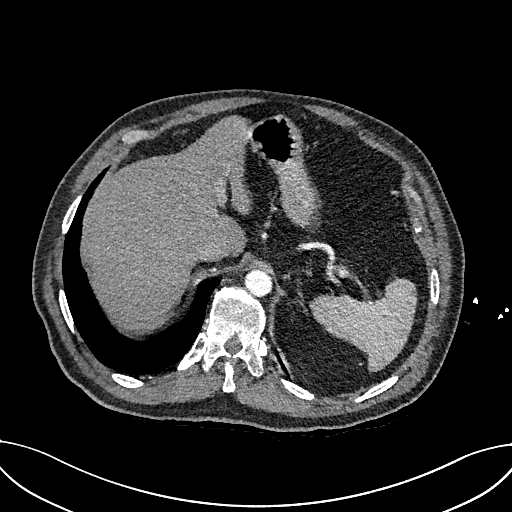
[im 103/338  lung]
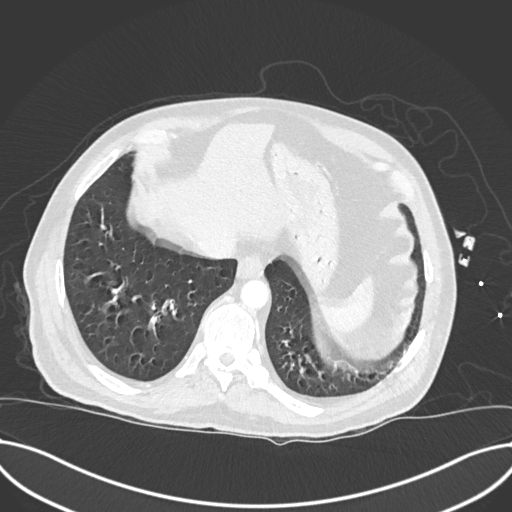
[im 118/338  soft-tissue]
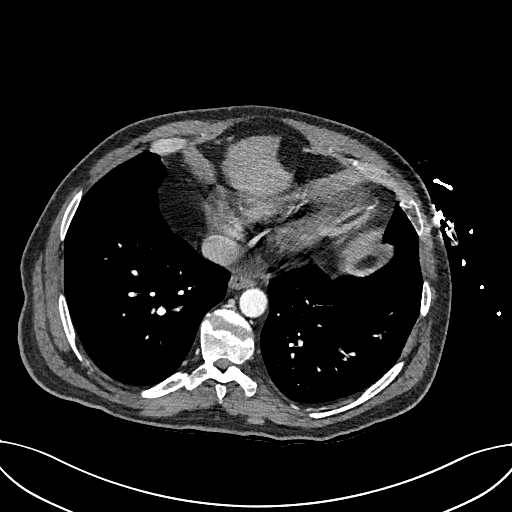
[im 132/338  lung]
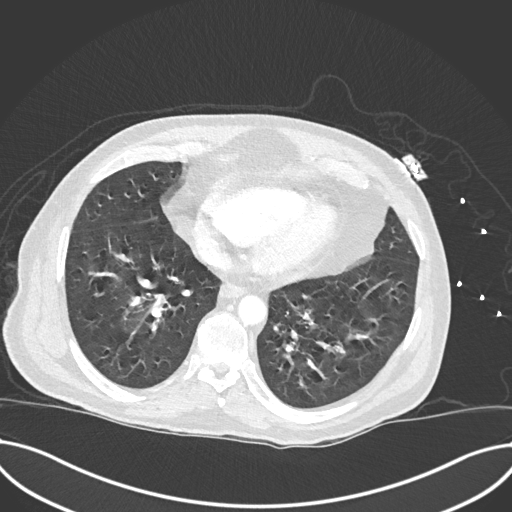
[im 162/338  soft-tissue]
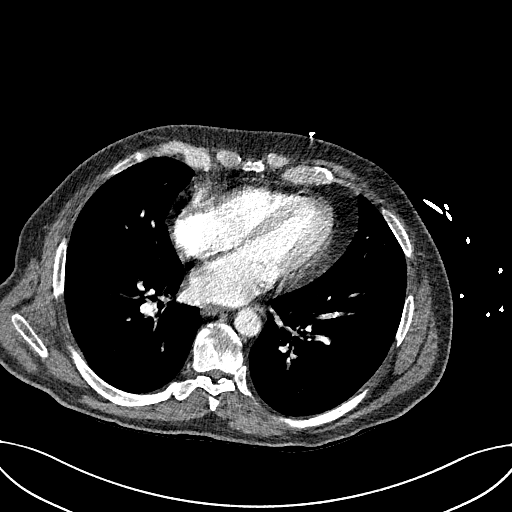
[im 176/338  lung]
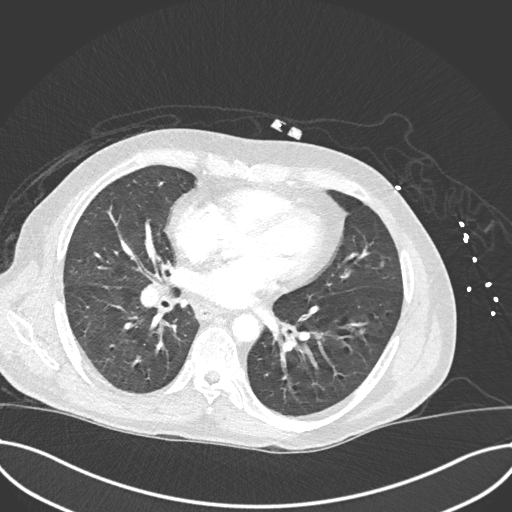
[im 206/338  soft-tissue]
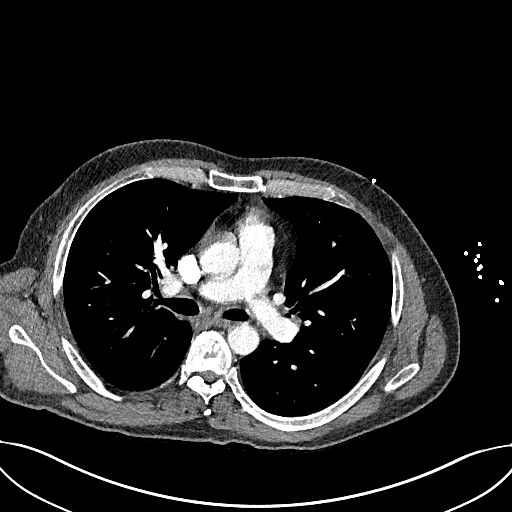
[im 220/338  lung]
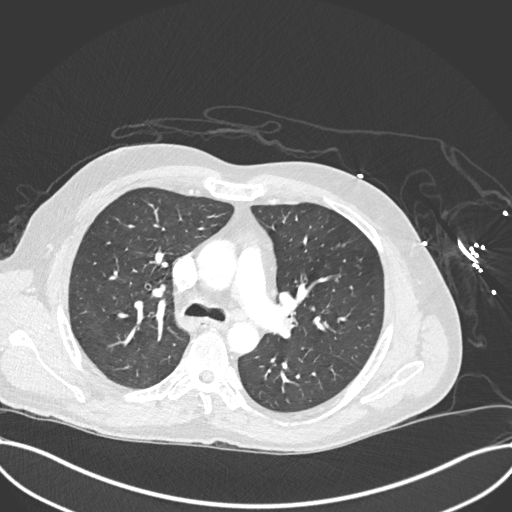
[im 235/338  soft-tissue]
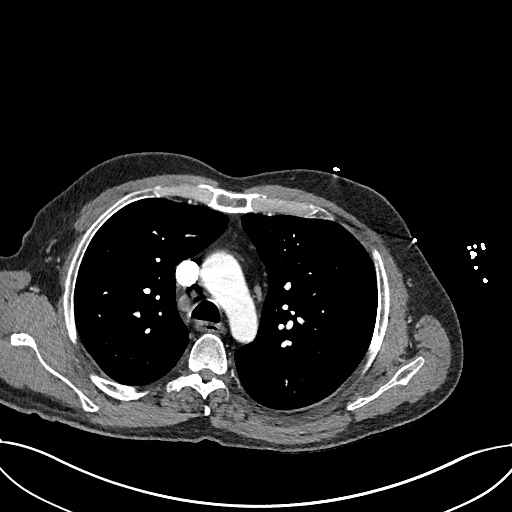
[im 264/338  lung]
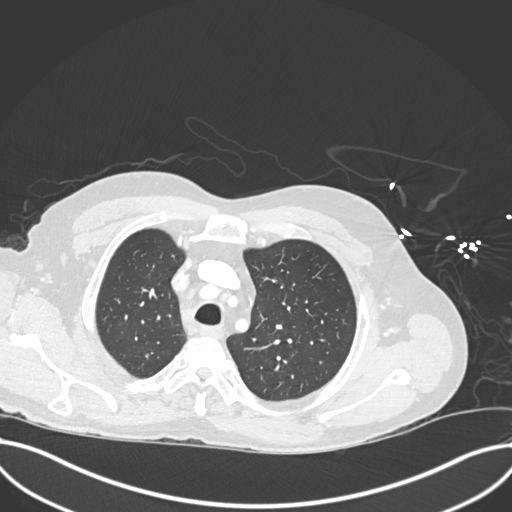
[im 279/338  soft-tissue]
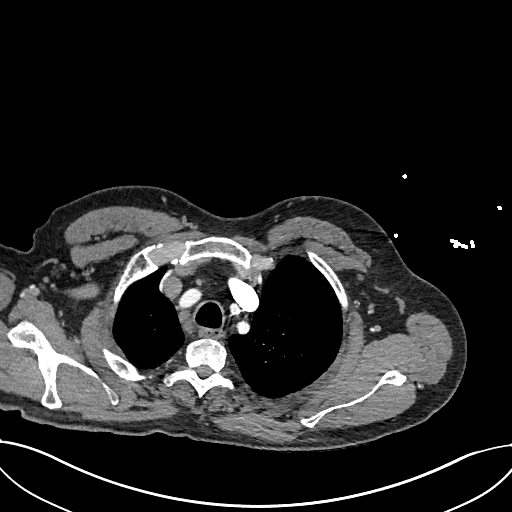
[im 294/338  lung]
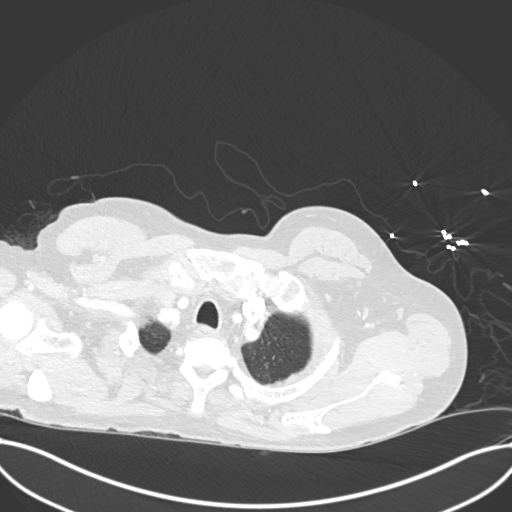
[im 323/338  soft-tissue]
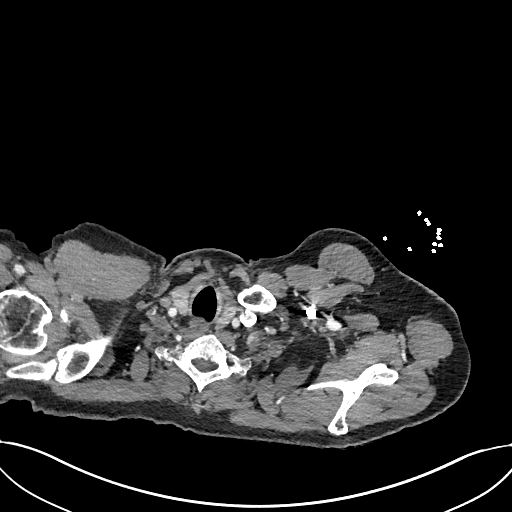

[Series 7: cor soft · coronal · 0.69mm/px · 3 of 160 slices shown]
[im 40/160  soft-tissue]
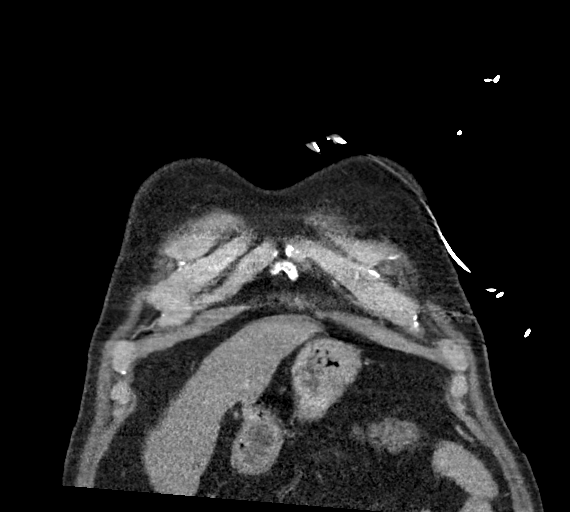
[im 80/160  soft-tissue]
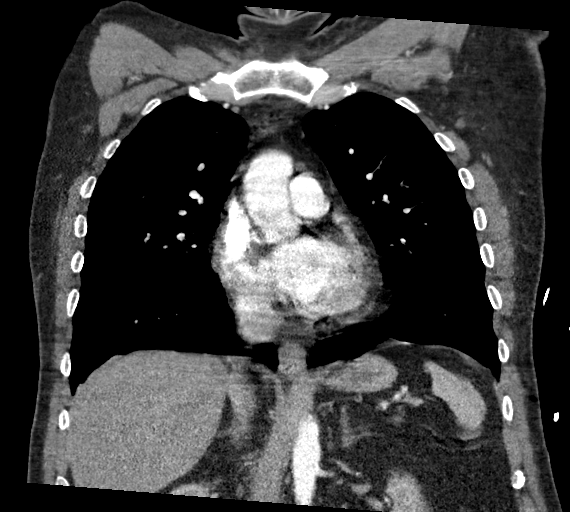
[im 120/160  soft-tissue]
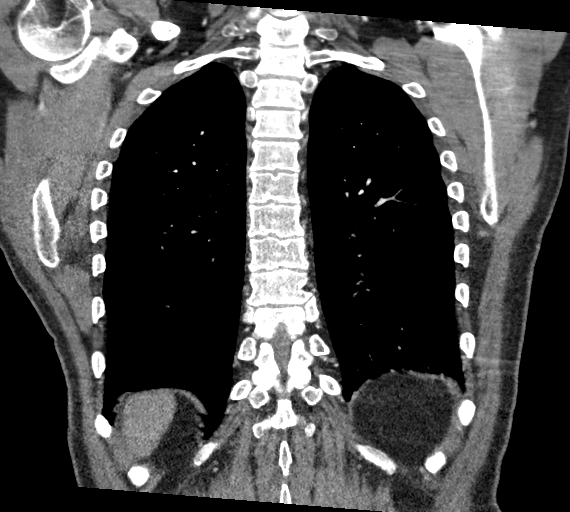

[19 of 46 positions shown; findings below may reference images not displayed]

FINDINGS: Cardiovascular: This is a technically adequate evaluation of the
pulmonary vasculature. No filling defects or pulmonary emboli.

The heart is unremarkable without pericardial effusion. Normal
appearance of the thoracic aorta.

Mediastinum/Nodes: Borderline enlarged mediastinal lymph nodes are
stable and likely benign. No pathologic adenopathy. Thyroid,
trachea, and esophagus are unremarkable.

Lungs/Pleura: No airspace disease, effusion, or pneumothorax. There
is mild bronchial wall thickening most pronounced in the bilateral
lower lobes. Central airways are patent.

Upper Abdomen: No acute abnormality.

Musculoskeletal: No acute or destructive bony lesions. Reconstructed
images demonstrate no additional findings.

Review of the MIP images confirms the above findings.
IMPRESSION: 1. No evidence of pulmonary embolus.
2. Lower lobe bronchial wall thickening compatible with bronchitis.
No acute airspace disease.

## 2022-08-06 ENCOUNTER — Ambulatory Visit (HOSPITAL_COMMUNITY)
Admission: RE | Admit: 2022-08-06 | Discharge: 2022-08-06 | Disposition: A | Discharge status: Home / Self Care | Payer: Medicaid Other | Source: Ambulatory Visit | Attending: Family Medicine | Admitting: Family Medicine

## 2022-08-06 ENCOUNTER — Other Ambulatory Visit (HOSPITAL_COMMUNITY): Payer: Self-pay | Admitting: Family Medicine

## 2022-08-06 DIAGNOSIS — J449 Chronic obstructive pulmonary disease, unspecified: Secondary | ICD-10-CM

## 2022-10-27 ENCOUNTER — Other Ambulatory Visit (HOSPITAL_COMMUNITY): Payer: Self-pay | Admitting: Endocrinology

## 2022-10-27 DIAGNOSIS — R140 Abdominal distension (gaseous): Secondary | ICD-10-CM

## 2022-11-01 ENCOUNTER — Ambulatory Visit (HOSPITAL_BASED_OUTPATIENT_CLINIC_OR_DEPARTMENT_OTHER)
Admission: RE | Admit: 2022-11-01 | Discharge: 2022-11-01 | Disposition: A | Discharge status: Home / Self Care | Payer: Medicaid Other | Source: Ambulatory Visit | Attending: Endocrinology | Admitting: Endocrinology

## 2022-11-01 DIAGNOSIS — R140 Abdominal distension (gaseous): Secondary | ICD-10-CM | POA: Insufficient documentation

## 2023-02-23 ENCOUNTER — Telehealth: Payer: Self-pay | Admitting: Cardiology

## 2023-02-23 NOTE — Telephone Encounter (Signed)
Spoke with wife who request a visit for pt. She states that on Friday night pt was drinking tea that he started to cough and then passed out. Wife states that this has happened 6 times that she can remember over the past year or so. Wife reports that she had to call his name several times until he answered. Wife states that pt does have COPD and is a smoker. When asked if PCP was informed of these episodes wife stated that pt last saw cardiology a year ago and was due for follow-up. Appt made with Randall An, PA on December 11 at 3:30 pm. Informed wife that if this happens again to have pt evaluated in ER. Please advise.

## 2023-02-23 NOTE — Telephone Encounter (Signed)
.  SYNCOPE[facility]   Pt c/o Syncope: STAT if syncope occurred within 24 hours and pt complains of lightheadedness.   High Priority if episode of passing out, completely, today or in last 24 hours   1. Did you pass out today? no   2. When is the last time you passed out? Friday 02/20/23   3. Has this occurred multiple times? Yes, atleast 6 times  4. Did you have any symptoms prior to passing out? He coughs before he passes out. On Friday he drank some tea and starting coughing then passed out. At night per wife, patient wheezes at night and is SOB, he does have COPD and he is a smoker.   5. Did you fall? If so, are you on a blood thinner? Yes he does fall but not on blood thinners.    Patient is off Wednesday and Thursday of this week.

## 2023-02-23 NOTE — Telephone Encounter (Signed)
Wife notified of Dr. Ival Bible response.

## 2023-02-24 NOTE — Telephone Encounter (Signed)
I called patient and offered him a appt with Diona Browner at [phone] he said he couldn't make it.

## 2023-02-25 ENCOUNTER — Ambulatory Visit: Payer: 59 | Attending: Cardiology

## 2023-02-25 ENCOUNTER — Ambulatory Visit: Payer: 59 | Attending: Cardiology | Admitting: Cardiology

## 2023-02-25 ENCOUNTER — Encounter: Payer: Self-pay | Admitting: Cardiology

## 2023-02-25 VITALS — BP 106/76 | HR 79 | Ht 66.0 in | Wt 202.0 lb

## 2023-02-25 DIAGNOSIS — E782 Mixed hyperlipidemia: Secondary | ICD-10-CM

## 2023-02-25 DIAGNOSIS — R55 Syncope and collapse: Secondary | ICD-10-CM

## 2023-02-25 DIAGNOSIS — Z720 Tobacco use: Secondary | ICD-10-CM | POA: Diagnosis not present

## 2023-02-25 DIAGNOSIS — R054 Cough syncope: Secondary | ICD-10-CM

## 2023-02-25 NOTE — Patient Instructions (Signed)
Medication Instructions:  Your physician recommends that you continue on your current medications as directed. Please refer to the Current Medication list given to you today.   Labwork: None today  Testing/Procedures: ZIO XT- Long Term Monitor Instructions   Your physician has requested you wear your ZIO patch monitor___14____days.   This is a single patch monitor.  [employer] supplies one patch monitor per enrollment.  Additional stickers are not available.   Please do not apply patch if you will be having a Nuclear Stress Test, Echocardiogram, Cardiac CT, MRI, or Chest Xray during the time frame you would be wearing the monitor. The patch cannot be worn during these tests.  You cannot remove and re-apply the ZIO XT patch monitor.   Do not shower for the first 24 hours.  You may shower after the first 24 hours.   Press button if you feel a symptom. You will hear a small click.  Record Date, Time and Symptom in the Patient Log Book.   When you are ready to remove patch, follow instructions on last 2 pages of Patient Log Book.  Stick patch monitor onto last page of Patient Log Book.   Place Patient Log Book in [place] box.  Use locking tab on box and tape box closed securely.  The [employer] has [employer] on it.  Please place in mailbox as soon as possible.  Your physician should have your test results approximately 7 days after the monitor has been mailed back to [facility].   Call [place] at [phone] if you have questions regarding your ZIO XT patch monitor.  Call them immediately if you see an orange light blinking on your monitor.   If your monitor falls off in less than 4 days contact our Monitor department at [phone].  If your monitor becomes loose or falls off after 4 days call [employer] at [phone] for suggestions on securing your monitor.    Follow-Up: To be determined after monitor  Any Other Special Instructions Will Be Listed  Below (If Applicable).  If you need a refill on your cardiac medications before your next appointment, please call your pharmacy.

## 2023-02-25 NOTE — Progress Notes (Signed)
Cardiology Office Note  Date: 02/25/2023   ID: JED MASSAQUOI, DOB [birth year], MRN [id number]  History of Present Illness: [name] is a 52 y.o. male last seen in July 2023 by Ms. Lilla Shook, I reviewed the note (I last saw him in 2021).  Office visit scheduled due to recent episode of syncope.  He is here today with his wife.  He was at [employer] and had taken a drink just prior to leaving, had a coughing spell thereafter followed by an episode of frank syncope observed by his wife.  In retrospect he has had 6 or 7 episodes similar to this over the last year and a half.  He states that if he drinks something cold and starts to cough this is a very typical trigger.  Does not report any exertional chest pain, no palpitations associated with these events.  He has a longstanding history of tobacco abuse.  We talked about smoking cessation strategies today.  He does have a fair bit of chest congestion in the evenings.  I reviewed his medications which include Humalog, Zocor, Protonix, and Cozaar.  Physical Exam: VS:  BP 106/76 (BP Location: Left Arm, Patient Position: Sitting, Cuff Size: Normal)   Pulse 79   Ht 5\' 6"  (1.676 m)   Wt 202 lb (91.6 kg)   SpO2 95%   BMI 32.60 kg/m , BMI Body mass index is 32.6 kg/m.  Wt Readings from Last 3 Encounters:  02/25/23 202 lb (91.6 kg)  11/18/21 189 lb (85.7 kg)  11/04/21 193 lb 1.6 oz (87.6 kg)    General: Patient appears comfortable at rest. HEENT: Conjunctiva and lids normal. Neck: Supple, no elevated JVP or carotid bruits. Lungs: Clear to auscultation, nonlabored breathing at rest. Cardiac: Regular rate and rhythm, no S3 or significant systolic murmur, no pericardial rub.  ECG:  An ECG dated 11/03/2021 was personally reviewed today and demonstrated:  Sinus tachycardia with possible left atrial enlargement.  Labwork:  June 2024: BUN 17, creatinine 1.24, potassium 4.9, AST 26, ALT 38, cholesterol 145, triglycerides 181, HDL 40, LDL  74, hemoglobin A1c 8.2%  Other Studies Reviewed Today:  Echocardiogram 12/02/2021:  1. Left ventricular ejection fraction, by estimation, is 60 to 65%. Left  ventricular ejection fraction by 2D MOD biplane is 64.0 %. The left  ventricle has normal function. The left ventricle has no regional wall  motion abnormalities. Left ventricular  diastolic parameters are consistent with Grade I diastolic dysfunction  (impaired relaxation).   2. Right ventricular systolic function is low normal. The right  ventricular size is normal. Tricuspid regurgitation signal is inadequate  for assessing PA pressure.   3. The mitral valve is grossly normal. Trivial mitral valve  regurgitation.   4. The aortic valve is tricuspid. Aortic valve regurgitation is not  visualized.   5. The inferior vena cava is normal in size with greater than 50%  respiratory variability, suggesting right atrial pressure of 3 mmHg.   Assessment and Plan:  1.  Suspected posttussive syncope as discussed above.  No definite palpitations or prodromal symptoms.  His blood pressure is low normal today on Cozaar 100 mg daily.  I did ask them to get a home blood pressure cuff and just make sure that he is not having any relative hypotension that would result in a reduction in his baseline Cozaar dose.  Will place a 14-day ZIO monitor, although doubt arrhythmia as etiology at this time.  2.  Mixed hyperlipidemia, on  Zocor.  3.  Longstanding tobacco abuse.  We discussed smoking cessation strategies today.  Disposition:  Follow up  test results.  Signed, Jonelle Sidle, M.D., F.A.C.C. [facility] at [facility]

## 2023-04-08 ENCOUNTER — Ambulatory Visit: Payer: Medicaid Other | Admitting: Student

## 2023-05-21 ENCOUNTER — Ambulatory Visit: Payer: 59 | Admitting: Student

## 2023-09-04 ENCOUNTER — Ambulatory Visit: Payer: 59 | Admitting: Cardiology

## 2023-11-24 ENCOUNTER — Ambulatory Visit: Attending: Cardiology | Admitting: Cardiology

## 2023-11-24 ENCOUNTER — Encounter: Payer: Self-pay | Admitting: Cardiology

## 2023-11-24 VITALS — BP 120/74 | HR 96 | Ht 68.0 in | Wt 209.6 lb

## 2023-11-24 DIAGNOSIS — R053 Chronic cough: Secondary | ICD-10-CM

## 2023-11-24 DIAGNOSIS — I10 Essential (primary) hypertension: Secondary | ICD-10-CM | POA: Diagnosis not present

## 2023-11-24 DIAGNOSIS — R55 Syncope and collapse: Secondary | ICD-10-CM | POA: Diagnosis not present

## 2023-11-24 DIAGNOSIS — Z720 Tobacco use: Secondary | ICD-10-CM

## 2023-11-24 DIAGNOSIS — R054 Cough syncope: Secondary | ICD-10-CM

## 2023-11-24 DIAGNOSIS — E782 Mixed hyperlipidemia: Secondary | ICD-10-CM | POA: Diagnosis not present

## 2023-11-24 NOTE — Progress Notes (Signed)
    Cardiology Office Note  Date: 11/24/2023   ID: [name], DOB 1970/12/13, MRN [id number]  History of Present Illness: [name] is a 53 y.o. male last seen October 2024.  He is here for a routine visit.  He does not report any recurring syncope, still having trouble with recurring cough, also intermittent wheezing.  He has been trying to cut back on smoking, longstanding history of tobacco use.  He does not report any hemoptysis.  He has not seen a pulmonary specialist as yet.  We went over his medications.  He reports compliance with current regimen.  Blood pressure is well-controlled today.  We discussed his cardiac monitor from November of last year and I reviewed his interval lab work.  I reviewed his ECG today which shows normal sinus rhythm.  Physical Exam: VS:  BP 120/74 (BP Location: Left Arm, Cuff Size: Normal)   Pulse 96   Ht 5' 8 (1.727 m)   Wt 209 lb 9.6 oz (95.1 kg)   SpO2 94%   BMI 31.87 kg/m , BMI Body mass index is 31.87 kg/m.  Wt Readings from Last 3 Encounters:  11/24/23 209 lb 9.6 oz (95.1 kg)  02/25/23 202 lb (91.6 kg)  11/18/21 189 lb (85.7 kg)    General: Patient appears comfortable at rest. HEENT: Conjunctiva and lids normal. Neck: Supple, no elevated JVP or carotid bruits. Lungs: Decreased breath sounds with prolonged expiratory phase, mild expiratory wheeze anteriorly, no crackles. Cardiac: Regular rate and rhythm, no S3 or significant systolic murmur, no pericardial rub.  ECG:  An ECG dated 11/03/2021 was personally reviewed today and demonstrated:  Sinus tachycardia with possible left atrial enlargement.  Labwork:  May 2025: BUN 18, creatinine 1.15, potassium 4.6, GFR 75, AST 32, ALT 38, cholesterol 151, triglycerides 225, HDL 43, LDL 71, hemoglobin A1c 7.4%  Other Studies Reviewed Today:  Cardiac monitor November 2024: ZIO monitor reviewed.  13 days, 11 hours analyzed.   Predominant rhythm is sinus with heart rate ranging from 56 bpm  up to 154 bpm and average heart rate 97 bpm. There were rare PACs including atrial couplets and triplets representing less than 1% total beats. There were rare PVCs including ventricular couplets and triplets representing less than 1% total beats. There were 12 episodes of brief PSVT, the longest of which was 15 beats.  No sustained arrhythmias. No pauses or high degree heart block.  Assessment and Plan:  1.  Suspected posttussive syncope.  Cardiac monitor from November 2024 did not demonstrate any pauses or high degree heart block, brief atrial and ventricular ectopy as well as nonsustained SVT.  He reports no recurrent episodes since last year.  2.  Longstanding tobacco abuse.  He has chronic cough, no reported hemoptysis.  Also intermittent wheezing.  Plan to refer for formal Pulmonary consultation as he may benefit from PFTs and chest CT.   3.  Mixed hyperlipidemia, LDL 71 in May.  Continues on Zocor  40 mg daily.  Disposition:  Follow up 1 year.  Signed, Jayson JUDITHANN Sierras, M.D., F.A.C.C. [facility] at [facility]

## 2023-11-24 NOTE — Patient Instructions (Signed)
 Medication Instructions:   Your physician recommends that you continue on your current medications as directed. Please refer to the Current Medication list given to you today.   Labwork: None today  Testing/Procedures: None today   Follow-Up: 1 year  Any Other Special Instructions Will Be Listed Below (If Applicable).  You have been referred to pulmonary.They will call you to schedule appointment.   If you need a refill on your cardiac medications before your next appointment, please call your pharmacy.

## 2023-11-25 ENCOUNTER — Other Ambulatory Visit: Payer: Self-pay | Admitting: *Deleted

## 2023-11-25 DIAGNOSIS — M79606 Pain in leg, unspecified: Secondary | ICD-10-CM

## 2023-12-14 NOTE — Progress Notes (Unsigned)
 [facility] [place]  ASSESSMENT / PLAN: 53 y.o. male with radiating leg pain.  Normal vascular physical exam and noninvasive testing today.  Suspect orthopedic or neurologic cause of symptoms.  Patient can follow-up with me as needed.  CHIEF COMPLAINT: Leg pain  HISTORY OF PRESENT ILLNESS: [name] is a 53 y.o. male referred to clinic for evaluation of cramping pain in the right leg.  Patient reports radiating discomfort from his buttock down the posterior aspect of his leg associated with standing for prolonged period of time.  The patient reports he can walk as far and as fast as he likes.  He does not have any typical effort induced cramping seen in peripheral arterial disease.  He denies any ischemic rest pain.  He does occasionally have cramps at night which wake him up.  He has no ischemic ulcers about his feet.  Past Medical History:  Diagnosis Date   CKD (chronic kidney disease), stage II    Deafness    Left ear only. Congenital.   GERD (gastroesophageal reflux disease)    History of influenza    Hyperlipidemia    Hypertension    Sleep apnea    Thrombocytopenia (HCC)    Type 2 diabetes mellitus (HCC)    Vitamin B12 deficiency     Past Surgical History:  Procedure Laterality Date   COLONOSCOPY WITH PROPOFOL  N/A 01/04/2020   Procedure: COLONOSCOPY WITH PROPOFOL ;  Surgeon: Golda Claudis PENNER, MD;  Location: [facility];  Service: Endoscopy;  Laterality: N/A;  730   HEMORRHOID SURGERY     INFUSION PUMP IMPLANTATION     Insulin    NASAL SEPTUM SURGERY     POLYPECTOMY  01/04/2020   Procedure: POLYPECTOMY;  Surgeon: Golda Claudis PENNER, MD;  Location: [facility];  Service: Endoscopy;;    Family History  Problem Relation Age of Onset   Hypertension Mother    Hypertension Father    Heart disease Father     Social History   Socioeconomic History   Marital status: Married    Spouse name: Not on file   Number of children: Not on file   Years of  education: Not on file   Highest education level: Not on file  Occupational History   Not on file  Tobacco Use   Smoking status: Every Day    Current packs/day: 1.50    Average packs/day: 1.5 packs/day for 27.0 years (40.5 ttl pk-yrs)    Types: Cigarettes   Smokeless tobacco: Never  Vaping Use   Vaping status: Never Used  Substance and Sexual Activity   Alcohol use: Yes    Comment: Occasional   Drug use: No   Sexual activity: Not on file  Other Topics Concern   Not on file  Social History Narrative   Not on file   Social Drivers of Health   Financial Resource Strain: Not on file  Food Insecurity: Not on file  Transportation Needs: Not on file  Physical Activity: Not on file  Stress: Not on file  Social Connections: Not on file  Intimate Partner Violence: Not on file    Allergies  Allergen Reactions   Codeine Nausea Only    Drowsiness   Glucophage [Metformin Hydrochloride] Other (See Comments)    Vomiting and cramps    Current Outpatient Medications  Medication Sig Dispense Refill   benzonatate  (TESSALON ) 200 MG capsule Take 200 mg by mouth 3 (three) times daily.     Continuous Glucose Sensor (  GUARDIAN 4 GLUCOSE SENSOR) MISC See admin instructions.     HUMALOG 100 UNIT/ML injection Inject 100 Units into the skin See admin instructions. Pump     losartan  (COZAAR ) 100 MG tablet Take 1 tablet (100 mg total) by mouth daily.     Multiple Vitamins-Minerals (CENTRUM PO) Take 1 tablet by mouth daily.     Omega-3 Fatty Acids (FISH OIL) 1000 MG CPDR Take by mouth.     pantoprazole  (PROTONIX ) 40 MG tablet Take 40 mg daily by mouth.  0   sildenafil (VIAGRA) 100 MG tablet Take 100 mg by mouth as needed.     simvastatin  (ZOCOR ) 40 MG tablet Take 1 tablet (40 mg total) by mouth daily at 6 PM. 30 tablet 3   No current facility-administered medications for this visit.    PHYSICAL EXAM See nursing documentation for vitals  Middle-age man in no distress Regular rate and  rhythm Unlabored breathing 2+ dorsalis pedis pulses bilaterally   PERTINENT LABORATORY AND RADIOLOGIC DATA  Most recent CBC    Latest Ref Rng & Units 11/03/2021    3:36 AM 04/16/2020    2:06 AM 04/15/2020    4:29 PM  CBC  WBC 4.0 - 10.5 K/uL 17.3  7.1  11.3   Hemoglobin 13.0 - 17.0 g/dL 83.7  87.5  84.9   Hematocrit 39.0 - 52.0 % 49.5  38.7  43.9   Platelets 150 - 400 K/uL 199  148  161      Most recent CMP    Latest Ref Rng & Units 11/05/2021    3:47 AM 11/04/2021    3:52 AM 11/03/2021    3:18 PM  CMP  Glucose 70 - 99 mg/dL 533  733  882   BUN 6 - 20 mg/dL 21  25  28    Creatinine 0.61 - 1.24 mg/dL 8.78  8.66  8.63   Sodium 135 - 145 mmol/L 136  141  140   Potassium 3.5 - 5.1 mmol/L 4.6  4.3  3.4   Chloride 98 - 111 mmol/L 103  109  108   CO2 22 - 32 mmol/L 23  26  27    Calcium  8.9 - 10.3 mg/dL 8.4  8.6  8.9     Renal function CrCl cannot be calculated (Patient's most recent lab result is older than the maximum 21 days allowed.).  Hgb A1c MFr Bld (%)  Date Value  11/03/2021 8.1 (H)   ABI: RBP - 128 LBP - 140 RAT - 140 RPT - 150 R ABI - 1.07 LAT - 170 LPT - 150 L ABI - 1.21   [name] N. Magda, MD FACS [facility] Phone Number: [phone] 12/14/2023 8:02 AM   Total time spent on preparing this encounter including chart review, data review, collecting history, examining the patient, and coordinating care: 45 minutes  Portions of this report may have been transcribed using voice recognition software.  Every effort has been made to ensure accuracy; however, inadvertent computerized transcription errors may still be present.

## 2023-12-15 ENCOUNTER — Encounter: Payer: Self-pay | Admitting: Vascular Surgery

## 2023-12-15 ENCOUNTER — Ambulatory Visit: Admitting: Vascular Surgery

## 2023-12-15 ENCOUNTER — Ambulatory Visit

## 2023-12-15 VITALS — BP 127/78 | HR 71 | Ht 68.0 in | Wt 210.0 lb

## 2023-12-15 DIAGNOSIS — M5410 Radiculopathy, site unspecified: Secondary | ICD-10-CM

## 2023-12-15 DIAGNOSIS — M79606 Pain in leg, unspecified: Secondary | ICD-10-CM

## 2023-12-15 LAB — VAS US ABI WITH/WO TBI
Left ABI: 1.21
Right ABI: 1.07

## 2024-01-25 NOTE — Progress Notes (Unsigned)
 [name], male    DOB: 21-Jan-1971   MRN: [id number]   Brief patient profile:  53 yowm   active smoker  referred to pulmonary clinic 01/27/2024 by Debera  for cough and sob since 2023       Pt not previously seen by PCCM service.    History of Present Illness  01/27/2024  Pulmonary/ 1st office eval/Emogene Muratalla  Chief Complaint  Patient presents with   Consult    Referred by Dr. Debera for eval of cough. Pt c/o cough for the past year. Cough is non prod and esp worse at night.  He has some SOB with exertion at work- picking up things.   Dyspnea:  sob to mb and uphill and then downhill to house stops on landing   Cough: worse p supper , worse with cold drinks, worse 1st thing in am smoker's rattle / coughs to poit of vomit  Sleep: flat bed 2 pillow SABA use: none  02 ldz:wnwz  LDSCT:referred today  Can't breath in thru nose   No obvious day to day or daytime pattern/variability or assoc excess/ purulent sputum or mucus plugs or hemoptysis or cp or chest tightness, subjective wheeze or overt   hb symptoms.    Also denies any obvious fluctuation of symptoms with weather or environmental changes or other aggravating or alleviating factors except as outlined above   No unusual exposure hx or h/o childhood pna/ asthma or knowledge of premature birth.  Current Allergies, Complete Past Medical History, Past Surgical History, Family History, and Social History were reviewed in [employer] record.  ROS  The following are not active complaints unless bolded Hoarseness, sore throat, dysphagia, dental problems, itching, sneezing,  nasal congestion or discharge of excess mucus or purulent secretions, ear ache,   fever, chills, sweats, unintended wt loss or wt gain, classically pleuritic or exertional cp,  orthopnea pnd or arm/hand swelling  or leg swelling, presyncope, palpitations, abdominal pain, anorexia, nausea, vomiting, diarrhea  or change in bowel habits or change in  bladder habits, change in stools or change in urine, dysuria, hematuria,  rash, arthralgias, visual complaints, headache, numbness, weakness or ataxia or problems with walking or coordination,  change in mood or  memory.             Outpatient Medications Prior to Visit  Medication Sig Dispense Refill   Continuous Glucose Sensor (GUARDIAN 4 GLUCOSE SENSOR) MISC See admin instructions.     HUMALOG 100 UNIT/ML injection Inject 100 Units into the skin See admin instructions. Pump     losartan  (COZAAR ) 100 MG tablet Take 1 tablet (100 mg total) by mouth daily.     Multiple Vitamins-Minerals (CENTRUM PO) Take 1 tablet by mouth daily.     Omega-3 Fatty Acids (FISH OIL) 1000 MG CPDR Take by mouth.     pantoprazole  (PROTONIX ) 40 MG tablet Take 40 mg daily by mouth.  0   sildenafil (VIAGRA) 100 MG tablet Take 100 mg by mouth as needed.     simvastatin  (ZOCOR ) 40 MG tablet Take 1 tablet (40 mg total) by mouth daily at 6 PM. 30 tablet 3   benzonatate  (TESSALON ) 200 MG capsule Take 200 mg by mouth 3 (three) times daily.     No facility-administered medications prior to visit.    Past Medical History:  Diagnosis Date   CKD (chronic kidney disease), stage II    Deafness    Left ear only. Congenital.   GERD (gastroesophageal reflux  disease)    History of influenza    Hyperlipidemia    Hypertension    Sleep apnea    Thrombocytopenia    Type 2 diabetes mellitus (HCC)    Vitamin B12 deficiency       Objective:     BP 132/76   Pulse 78   Temp 98.1 F (36.7 C) (Oral)   Ht 5' 6.5 (1.689 m)   Wt 202 lb 12.8 oz (92 kg)   SpO2 96% Comment: on RA  BMI 32.24 kg/m   SpO2: 96 % (on RA)  amb Mod obese (by bmi)  wm nad  prominent pw    HEENT : Oropharynx  clar      Nasal turbinates mod non-specific edema s polyps or excess secretions   NECK :  without  apparent JVD/ palpable Nodes/TM    LUNGS: no acc muscle use,  Nl contour chest which is clear to A and P bilaterally without cough on  insp or exp maneuvers   CV:  RRR  no s3 or murmur or increase in P2, and no edema   ABD:  quite obese but soft and nontender   MS:  Gait nl   ext warm without deformities Or obvious joint restrictions  calf tenderness, cyanosis or clubbing    SKIN: warm and dry without lesions    NEURO:  alert, approp, nl sensorium with  no motor or cerebellar deficits apparent.       Assessment   Assessment & Plan Asthmatic bronchitis , chronic (HCC) Active smoker with overt GERD  -   After extensive coaching inhaler device,  effectiveness =    50% > try breztri plus max gerd rx and f/u with pfts  - 01/27/2024   Walked on RA  x  3  lap(s) =  approx 750  ft  @ mod pace, stopped due to end of study s sob  with lowest 02 sats 94% but up to 97% at the end before stopping    He has so much upper airway wheezing today I'm not really convinced he has copd at this point but will train him on hfa and f/u with pfts in the [facility] office    Cigarette smoker Counseled re importance of smoking cessation but did not meet time criteria for separate billing    Low-dose CT lung cancer screening is recommended for patients who are 64-35 years of age with a 20+ pack-year history of smoking and who are currently smoking or quit <=15 years ago. No coughing up blood  No unintentional weight loss of > 15 pounds in the last 6 months - pt is eligible for scanning yearly until 53 y p quits > referred today for LCS  Discussed in detail all the  indications, usual  risks and alternatives  relative to the benefits with patient who agrees to proceed with w/u as outlined.       Gastroesophageal reflux disease, unspecified whether esophagitis present Reviewed diet/ bed blocks/ wt loss with him today   Each maintenance medication was reviewed in detail including emphasizing most importantly the difference between maintenance and prns and under what circumstances the prns are to be triggered using an action plan format where  appropriate.  Total time for H and P, chart review, counseling, reviewing hfa device(s) , directly observing portions of ambulatory 02 saturation study/ and generating customized AVS unique to this office visit / same day charting = 45 min new pt eval  AVS  Patient Instructions  Pantoprazole  (protonix ) 40 mg   Take  30-60 min before first meal of the day and Pepcid (famotidine)  20 mg after supper until return to office - this is the best way to tell whether stomach acid is contributing to your problem.    GERD (REFLUX)  is an extremely common cause of respiratory symptoms just like yours , many times with no obvious heartburn at all.    It can be treated with medication, but also with lifestyle changes including elevation of the head of your bed (ideally with 6 -8inch blocks under the headboard of your bed),  Smoking cessation, avoidance of late meals, excessive alcohol, and avoid fatty foods, chocolate, peppermint, colas, red wine, and acidic juices such as orange juice.  NO MINT OR MENTHOL PRODUCTS SO NO COUGH DROPS  USE SUGARLESS CANDY INSTEAD (Jolley ranchers or Stover's or Life Savers) or even ice chips will also do - the key is to swallow to prevent all throat clearing. NO OIL BASED VITAMINS - use powdered substitutes.  Avoid fish oil when coughing.    My office will be contacting you by phone for referral to lung cancer screening   [phone]) - if you don't hear back from my office within one week,  please call us  back or notify us  thru MyChart and we'll address it right away.     Breztri Take 2 puffs first thing in am and then another 2 puffs about 12 hours later.    Work on inhaler technique:  relax and gently blow all the way out then take a nice smooth full deep breath back in, triggering the inhaler at same time you start breathing in.  Hold breath in for at least  5 seconds if you can. Blow out [employer]  thru nose. Rinse and gargle with water  when done.   If mouth or throat bother you at all,  try brushing teeth/gums/tongue with arm and hammer toothpaste/ make a slurry and gargle and spit out.   If doing better go ahead and fill the prescription  Please schedule a follow up office visit in 6 weeks, call sooner if needed [facility] office with PFTs  same day.           Ozell America, MD 01/27/2024

## 2024-01-27 ENCOUNTER — Ambulatory Visit: Admitting: Internal Medicine

## 2024-01-27 ENCOUNTER — Encounter: Payer: Self-pay | Admitting: Internal Medicine

## 2024-01-27 VITALS — BP 132/76 | HR 78 | Temp 98.1°F | Ht 66.5 in | Wt 202.8 lb

## 2024-01-27 DIAGNOSIS — J4489 Other specified chronic obstructive pulmonary disease: Secondary | ICD-10-CM

## 2024-01-27 DIAGNOSIS — F17210 Nicotine dependence, cigarettes, uncomplicated: Secondary | ICD-10-CM

## 2024-01-27 DIAGNOSIS — K219 Gastro-esophageal reflux disease without esophagitis: Secondary | ICD-10-CM | POA: Diagnosis not present

## 2024-01-27 DIAGNOSIS — J449 Chronic obstructive pulmonary disease, unspecified: Secondary | ICD-10-CM | POA: Insufficient documentation

## 2024-01-27 MED ORDER — BREZTRI AEROSPHERE 160-9-4.8 MCG/ACT IN AERO: 2.0000 | INHALATION_SPRAY | Freq: Two times a day (BID) | RESPIRATORY_TRACT | Status: AC

## 2024-01-27 MED ORDER — FAMOTIDINE 20 MG PO TABS: ORAL_TABLET | ORAL | 11 refills | Status: AC

## 2024-01-27 MED ORDER — BREZTRI AEROSPHERE 160-9-4.8 MCG/ACT IN AERO: 2.0000 | INHALATION_SPRAY | Freq: Two times a day (BID) | RESPIRATORY_TRACT | 11 refills | Status: DC

## 2024-01-27 NOTE — Assessment & Plan Note (Addendum)
 Active smoker with overt GERD  -   After extensive coaching inhaler device,  effectiveness =    50% > try breztri plus max gerd rx and f/u with pfts  - 01/27/2024   Walked on RA  x  3  lap(s) =  approx 750  ft  @ mod pace, stopped due to end of study s sob  with lowest 02 sats 94% but up to 97% at the end before stopping    He has so much upper airway wheezing today I'm not really convinced he has copd at this point but will train him on hfa and f/u with pfts in the [facility] office

## 2024-01-27 NOTE — Patient Instructions (Addendum)
 Pantoprazole  (protonix ) 40 mg   Take  30-60 min before first meal of the day and Pepcid (famotidine)  20 mg after supper until return to office - this is the best way to tell whether stomach acid is contributing to your problem.    GERD (REFLUX)  is an extremely common cause of respiratory symptoms just like yours , many times with no obvious heartburn at all.    It can be treated with medication, but also with lifestyle changes including elevation of the head of your bed (ideally with 6 -8inch blocks under the headboard of your bed),  Smoking cessation, avoidance of late meals, excessive alcohol, and avoid fatty foods, chocolate, peppermint, colas, red wine, and acidic juices such as orange juice.  NO MINT OR MENTHOL PRODUCTS SO NO COUGH DROPS  USE SUGARLESS CANDY INSTEAD (Jolley ranchers or Stover's or Life Savers) or even ice chips will also do - the key is to swallow to prevent all throat clearing. NO OIL BASED VITAMINS - use powdered substitutes.  Avoid fish oil when coughing.    My office will be contacting you by phone for referral to lung cancer screening   [phone]) - if you don't hear back from my office within one week,  please call us  back or notify us  thru MyChart and we'll address it right away.     Breztri Take 2 puffs first thing in am and then another 2 puffs about 12 hours later.    Work on inhaler technique:  relax and gently blow all the way out then take a nice smooth full deep breath back in, triggering the inhaler at same time you start breathing in.  Hold breath in for at least  5 seconds if you can. Blow out [employer]  thru nose. Rinse and gargle with water  when done.  If mouth or throat bother you at all,  try brushing teeth/gums/tongue with arm and hammer toothpaste/ make a slurry and gargle and spit out.   If doing better go ahead and fill the prescription  Please schedule a follow up office visit in 6 weeks, call sooner if needed [facility] office with PFTs  same  day.

## 2024-01-27 NOTE — Assessment & Plan Note (Addendum)
 Counseled re importance of smoking cessation but did not meet time criteria for separate billing    Low-dose CT lung cancer screening is recommended for patients who are 64-55 years of age with a 20+ pack-year history of smoking and who are currently smoking or quit <=15 years ago. No coughing up blood  No unintentional weight loss of > 15 pounds in the last 6 months - pt is eligible for scanning yearly until 64 y p quits > referred today for LCS  Discussed in detail all the  indications, usual  risks and alternatives  relative to the benefits with patient who agrees to proceed with w/u as outlined.

## 2024-01-27 NOTE — Assessment & Plan Note (Addendum)
 Reviewed diet/ bed blocks/ wt loss with him today   Each maintenance medication was reviewed in detail including emphasizing most importantly the difference between maintenance and prns and under what circumstances the prns are to be triggered using an action plan format where appropriate.  Total time for H and P, chart review, counseling, reviewing hfa device(s) , directly observing portions of ambulatory 02 saturation study/ and generating customized AVS unique to this office visit / same day charting = 45 min new pt eval

## 2024-01-27 NOTE — Addendum Note (Signed)
 Addended by: Joylynn Defrancesco M on: 01/27/2024 09:39 AM   Modules accepted: Orders

## 2024-02-02 ENCOUNTER — Ambulatory Visit (HOSPITAL_COMMUNITY)
Admission: RE | Admit: 2024-02-02 | Discharge: 2024-02-02 | Disposition: A | Discharge status: Home / Self Care | Source: Ambulatory Visit | Attending: Internal Medicine | Admitting: Internal Medicine

## 2024-02-02 DIAGNOSIS — F17210 Nicotine dependence, cigarettes, uncomplicated: Secondary | ICD-10-CM | POA: Diagnosis present

## 2024-02-02 DIAGNOSIS — J4489 Other specified chronic obstructive pulmonary disease: Secondary | ICD-10-CM | POA: Insufficient documentation

## 2024-02-02 LAB — PULMONARY FUNCTION TEST
DL/VA % pred: 84 %
DL/VA: 3.73 ml/min/mmHg/L
DLCO unc % pred: 63 %
DLCO unc: 15.84 ml/min/mmHg
FEF 25-75 Post: 0.66 L/s
FEF 25-75 Pre: 0.71 L/s
FEF2575-%Change-Post: -7 %
FEF2575-%Pred-Post: 23 %
FEF2575-%Pred-Pre: 24 %
FEV1-%Change-Post: -4 %
FEV1-%Pred-Post: 37 %
FEV1-%Pred-Pre: 39 %
FEV1-Post: 1.22 L
FEV1-Pre: 1.28 L
FEV1FVC-%Change-Post: -8 %
FEV1FVC-%Pred-Pre: 79 %
FEV6-%Change-Post: 3 %
FEV6-%Pred-Post: 53 %
FEV6-%Pred-Pre: 51 %
FEV6-Post: 2.17 L
FEV6-Pre: 2.1 L
FEV6FVC-%Change-Post: -1 %
FEV6FVC-%Pred-Post: 102 %
FEV6FVC-%Pred-Pre: 103 %
FVC-%Change-Post: 4 %
FVC-%Pred-Post: 51 %
FVC-%Pred-Pre: 49 %
FVC-Post: 2.21 L
FVC-Pre: 2.12 L
Post FEV1/FVC ratio: 55 %
Post FEV6/FVC ratio: 98 %
Pre FEV1/FVC ratio: 61 %
Pre FEV6/FVC Ratio: 99 %
RV % pred: 172 %
RV: 3.3 L
TLC % pred: 95 %
TLC: 5.92 L

## 2024-02-02 MED ORDER — ALBUTEROL SULFATE (2.5 MG/3ML) 0.083% IN NEBU
2.5000 mg | INHALATION_SOLUTION | Freq: Once | RESPIRATORY_TRACT | Status: AC
  Administered 2024-02-02: 2.5 mg via RESPIRATORY_TRACT

## 2024-02-03 ENCOUNTER — Telehealth: Payer: Self-pay

## 2024-02-03 DIAGNOSIS — Z87891 Personal history of nicotine dependence: Secondary | ICD-10-CM

## 2024-02-03 DIAGNOSIS — F17210 Nicotine dependence, cigarettes, uncomplicated: Secondary | ICD-10-CM

## 2024-02-03 DIAGNOSIS — Z122 Encounter for screening for malignant neoplasm of respiratory organs: Secondary | ICD-10-CM

## 2024-02-03 NOTE — Telephone Encounter (Signed)
 Lung Cancer Screening Narrative/Criteria Questionnaire (Cigarette Smokers Only- No Cigars/Pipes/vapes)   [name]   SDMV:02/10/2024 at 10:45 am [name]       11-Mar-1971               LDCT: 02/10/2024 at 12:30 pm AP    53 y.o.   Phone: [phone]  Lung Screening Narrative (confirm age 53-77 yrs Medicare / 50-80 yrs Private pay insurance)   Insurance information: [employer]   Referring Provider: Darlean   This screening involves an initial phone call with a team member from our program. It is called a shared decision making visit. The initial meeting is required by  insurance and Medicare to make sure you understand the program. This appointment takes about 15-20 minutes to complete. You will complete the screening scan at your scheduled date/time.  This scan takes about 5-10 minutes to complete. You can eat and drink normally before and after the scan.  Criteria questions for Lung Cancer Screening:   Are you a current or former smoker? Current Age began smoking: 12   If you are a former smoker, what year did you quit smoking? Never quit (within 15 yrs)   To calculate your smoking history, I need an accurate estimate of how many packs of cigarettes you smoked per day and for how many years. (Not just the number of PPD you are now smoking)   Years smoking 41 x Packs per day 2 = Pack years 82   (at least 20 pack yrs)   (Make sure they understand that we need to know how much they have smoked in the past, not just the number of PPD they are smoking now)  Do you have a personal history of cancer?  No    Do you have a family history of cancer? Yes - (type and when diagnosed - 5 yrs cancer free) Mother and sister with breast cancer.   Are you coughing up blood?  No  Have you had unexplained weight loss of 15 lbs or more in the last 6 months? No  It looks like you meet all criteria.  When would be a good time for us  to schedule you for this screening?   Additional information: N/A

## 2024-02-07 ENCOUNTER — Ambulatory Visit: Payer: Self-pay | Admitting: Internal Medicine

## 2024-02-07 ENCOUNTER — Encounter: Payer: Self-pay | Admitting: Internal Medicine

## 2024-02-10 ENCOUNTER — Ambulatory Visit (HOSPITAL_COMMUNITY)
Admission: RE | Admit: 2024-02-10 | Discharge: 2024-02-10 | Disposition: A | Discharge status: Home / Self Care | Source: Ambulatory Visit | Attending: Acute Care | Admitting: Acute Care

## 2024-02-10 ENCOUNTER — Ambulatory Visit: Admitting: Adult Health

## 2024-02-10 ENCOUNTER — Telehealth: Payer: Self-pay | Admitting: Internal Medicine

## 2024-02-10 ENCOUNTER — Telehealth: Payer: Self-pay

## 2024-02-10 ENCOUNTER — Encounter: Payer: Self-pay | Admitting: Adult Health

## 2024-02-10 DIAGNOSIS — F17210 Nicotine dependence, cigarettes, uncomplicated: Secondary | ICD-10-CM

## 2024-02-10 DIAGNOSIS — Z122 Encounter for screening for malignant neoplasm of respiratory organs: Secondary | ICD-10-CM | POA: Diagnosis present

## 2024-02-10 DIAGNOSIS — Z87891 Personal history of nicotine dependence: Secondary | ICD-10-CM | POA: Diagnosis present

## 2024-02-10 MED ORDER — BREZTRI AEROSPHERE 160-9-4.8 MCG/ACT IN AERO: 2.0000 | INHALATION_SPRAY | Freq: Two times a day (BID) | RESPIRATORY_TRACT | 11 refills | Status: AC

## 2024-02-10 NOTE — Progress Notes (Signed)
 Called pt - advised of PFT results per Dr. Darlean. Pt verbalized understanding, NFN.

## 2024-02-10 NOTE — Telephone Encounter (Unsigned)
 Copied from CRM #[id number]. Topic: Clinical - Medication Refill >> Feb 10, 2024  9:11 AM Celestine FALCON wrote: Medication: budesonide-glycopyrrolate-formoterol (BREZTRI AEROSPHERE) 160-9-4.8 MCG/ACT AERO inhaler   Has the patient contacted their pharmacy? Yes (Agent: If no, request that the patient contact the pharmacy for the refill. If patient does not wish to contact the pharmacy document the reason why and proceed with request.) (Agent: If yes, when and what did the pharmacy advise?)  This is the patient's preferred pharmacy:  [facility] [address] [place] - [address] [place] [address] Phone: [phone] Fax: [phone]  Is this the correct pharmacy for this prescription? Yes If no, delete pharmacy and type the correct one.   Has the prescription been filled recently? Yes; looks like it was signed by Dr. Darlean on 01/27/2024, but I don't see pharmacy info attached to the order.   Is the patient out of the medication? Yes  Has the patient been seen for an appointment in the last year OR does the patient have an upcoming appointment? Yes  Can we respond through MyChart? Yes  Agent: Please be advised that Rx refills may take up to 3 business days. We ask that you follow-up with your pharmacy.

## 2024-02-10 NOTE — Progress Notes (Signed)
  Virtual Visit via Telephone Note  I connected with [name] , 02/10/24 10:38 AM by a telemedicine application and verified that I am speaking with the correct person using two identifiers.  Location: Patient: home Provider: home   I discussed the limitations of evaluation and management by telemedicine and the availability of in person appointments. The patient expressed understanding and agreed to proceed.   Shared Decision Making Visit Lung Cancer Screening Program [phone])   Eligibility: 53 y.o. Pack Years Smoking History Calculation = 82 pack years  (# packs/per year x # years smoked) Recent History of coughing up blood  no Unexplained weight loss? no ( >Than 15 pounds within the last 6 months ) Prior History Lung / other cancer no (Diagnosis within the last 5 years already requiring surveillance chest CT Scans). Smoking Status Current Smoker   Visit Components: Discussion included one or more decision making aids. YES Discussion included risk/benefits of screening. YES Discussion included potential follow up diagnostic testing for abnormal scans. YES Discussion included meaning and risk of over diagnosis. YES Discussion included meaning and risk of False Positives. YES Discussion included meaning of total radiation exposure. YES  Counseling Included: Importance of adherence to annual lung cancer LDCT screening. YES Impact of comorbidities on ability to participate in the program. YES Ability and willingness to under diagnostic treatment. YES  Smoking Cessation Counseling: Current Smokers:  Discussed importance of smoking cessation. yes Information about tobacco cessation classes and interventions provided to patient. yes Patient provided with ticket for LDCT Scan. yes Symptomatic Patient. NO Diagnosis Code: Tobacco Use Z72.0 Asymptomatic Patient yes  Counseling - 4 minutes of smoking cessation counseling (CT Chest Lung Cancer Screening Low Dose W/O CM)  [id number]  Z12.2-Screening of respiratory organs Z87.891-Personal history of nicotine  dependence   [name] 02/10/24

## 2024-02-10 NOTE — Telephone Encounter (Signed)
 Copied from CRM [id number]. Topic: Clinical - Medication Question >> Feb 10, 2024  3:06 PM Devaughn RAMAN wrote: Reason for CRM: Patient called stating he needs his budesonide-glycopyrrolate-formoterol (BREZTRI AEROSPHERE) 160-9-4.8 MCG/ACT AERO inhaler filled. Patient stated it is a new medication and has never been filled, he only had samples but needs an actual precription for the budesonide-glycopyrrolate-formoterol (BREZTRI AEROSPHERE) 160-9-4.8 MCG/ACT AERO inhaler. Patient would like a callback regarding this.  I have called patient,sent in refill.NFN

## 2024-02-10 NOTE — Patient Instructions (Signed)

## 2024-02-12 ENCOUNTER — Other Ambulatory Visit: Payer: Self-pay

## 2024-02-12 DIAGNOSIS — Z87891 Personal history of nicotine dependence: Secondary | ICD-10-CM

## 2024-02-12 DIAGNOSIS — Z122 Encounter for screening for malignant neoplasm of respiratory organs: Secondary | ICD-10-CM

## 2024-02-12 DIAGNOSIS — F17210 Nicotine dependence, cigarettes, uncomplicated: Secondary | ICD-10-CM

## 2024-03-09 ENCOUNTER — Encounter: Payer: Self-pay | Admitting: Internal Medicine

## 2024-03-09 ENCOUNTER — Ambulatory Visit: Admitting: Internal Medicine

## 2024-03-09 VITALS — BP 126/77 | HR 111 | Ht 66.5 in | Wt 206.2 lb

## 2024-03-09 DIAGNOSIS — J432 Centrilobular emphysema: Secondary | ICD-10-CM | POA: Diagnosis not present

## 2024-03-09 DIAGNOSIS — J4489 Other specified chronic obstructive pulmonary disease: Secondary | ICD-10-CM

## 2024-03-09 DIAGNOSIS — J449 Chronic obstructive pulmonary disease, unspecified: Secondary | ICD-10-CM | POA: Diagnosis not present

## 2024-03-09 DIAGNOSIS — F17210 Nicotine dependence, cigarettes, uncomplicated: Secondary | ICD-10-CM | POA: Diagnosis not present

## 2024-03-09 MED ORDER — BREZTRI AEROSPHERE 160-9-4.8 MCG/ACT IN AERO: 2.0000 | INHALATION_SPRAY | Freq: Two times a day (BID) | RESPIRATORY_TRACT | Status: AC

## 2024-03-09 NOTE — Patient Instructions (Signed)
 Keep working on the stopping smoking - it's the most important aspect of your care   Call if want to try the Ohtuvayre  nebulizer - it's the only other medication we could add but not sure how expensive it will be.    Please schedule a follow up visit in  6 months but call sooner if needed

## 2024-03-09 NOTE — Assessment & Plan Note (Addendum)
 5   min discussion re active cigarette smoking in addition to office E&M  Ask about tobacco use:   ongoing  Advise quitting   I took an extended  opportunity with this patient to outline the consequences of continued cigarette use  in airway disorders based on all the data we have from the multiple national lung health studies (perfomed over decades at millions of dollars in cost)  indicating that smoking cessation, not choice of inhalers or pulmonary physicians, is the most important aspect of his  care, especially as regards the cough  Assess willingness:  Not committed at this point Assist in quit attempt:  Per PCP when ready Arrange follow up:   Follow up per Primary Care planned

## 2024-03-09 NOTE — Progress Notes (Signed)
 [name], male    DOB: Apr 02, 1971   MRN: [id number]   Brief patient profile:  2 yowm   active smoker  referred to pulmonary clinic 01/27/2024 by Debera  for cough and sob since 2023         History of Present Illness  01/27/2024  Pulmonary/ 1st office eval/[name]  Chief Complaint  Patient presents with   Consult    Referred by Dr. Debera for eval of cough. Pt c/o cough for the past year. Cough is non prod and esp worse at night.  He has some SOB with exertion at work- picking up things.   Dyspnea:  sob to mb and uphill and then downhill to house stops on landing   Cough: worse p supper , worse with cold drinks, worse 1st thing in am smoker's rattle / coughs to point  of vomit  Sleep: flat bed 2 pillow SABA use: none  02 ldz:wnwz  LDSCT:referred today  Can't breath in thru nose x years p broke as teenager playing baseball  Patient Instructions  Pantoprazole  (protonix ) 40 mg   Take  30-60 min before first meal of the day and Pepcid (famotidine)  20 mg after supper until return to office - .   GERD diet reviewed, bed blocks rec  Breztri Take 2 puffs first thing in am and then another 2 puffs about 12 hours later.  Work on inhaler technique: Please schedule a follow up office visit in 6 weeks, call sooner if needed [facility] office with PFTs  same day.  LDSCT  02/10/24 RADS 1  . Mild centrilobular emphysema.   03/09/2024  f/u ov/[name] office/[name] re: GOLD 3 copd/ Mild centrilobular emphysema. maint on breztri    active smoker   Chief Complaint  Patient presents with   Asthma    F/u   Dyspnea:  little better mb and back  Cough: less severe/ not vomiting as much - cough starts up when gets out in cold  improved on present rx vs basline  Sleeping: 20 degrees/ 2 pillows s  noct   resp  cc / does not wake up coughing  SABA use: none  02: none    No obvious day to day or daytime variability or assoc excess/ purulent sputum or mucus plugs or hemoptysis or cp or chest tightness,  subjective wheeze or overt sinus or hb symptoms.    Also denies any obvious fluctuation of symptoms with weather or environmental changes or other aggravating or alleviating factors except as outlined above   No unusual exposure hx or h/o childhood pna/ asthma or knowledge of premature birth.  Current Allergies, Complete Past Medical History, Past Surgical History, Family History, and Social History were reviewed in [employer] record.  ROS  The following are not active complaints unless bolded Hoarseness, sore throat, dysphagia, dental problems, itching, sneezing,  nasal congestion or discharge of excess mucus or purulent secretions, ear ache,   fever, chills, sweats, unintended wt loss or wt gain, classically pleuritic or exertional cp,  orthopnea pnd or arm/hand swelling  or leg swelling, presyncope, palpitations, abdominal pain, anorexia, nausea, vomiting, diarrhea  or change in bowel habits or change in bladder habits, change in stools or change in urine, dysuria, hematuria,  rash, arthralgias, visual complaints, headache, numbness, weakness or ataxia or problems with walking or coordination,  change in mood or  memory.        Current Meds  Medication Sig   budesonide-glycopyrrolate-formoterol (BREZTRI AEROSPHERE) 160-9-4.8  MCG/ACT AERO inhaler Inhale 2 puffs into the lungs in the morning and at bedtime.   budesonide-glycopyrrolate-formoterol (BREZTRI AEROSPHERE) 160-9-4.8 MCG/ACT AERO inhaler Inhale 2 puffs into the lungs 2 (two) times daily. Take 2 puffs first thing in am and then another 2 puffs about 12 hours later.   Continuous Glucose Sensor (GUARDIAN 4 GLUCOSE SENSOR) MISC See admin instructions.   famotidine (PEPCID) 20 MG tablet One after supper   HUMALOG 100 UNIT/ML injection Inject 100 Units into the skin See admin instructions. Pump   losartan  (COZAAR ) 100 MG tablet Take 1 tablet (100 mg total) by mouth daily.   Multiple Vitamins-Minerals (CENTRUM PO) Take  1 tablet by mouth daily.   Omega-3 Fatty Acids (FISH OIL) 1000 MG CPDR Take by mouth.   pantoprazole  (PROTONIX ) 40 MG tablet Take 40 mg daily by mouth.   sildenafil (VIAGRA) 100 MG tablet Take 100 mg by mouth as needed.   simvastatin  (ZOCOR ) 40 MG tablet Take 1 tablet (40 mg total) by mouth daily at 6 PM.           Past Medical History:  Diagnosis Date   CKD (chronic kidney disease), stage II    Deafness    Left ear only. Congenital.   GERD (gastroesophageal reflux disease)    History of influenza    Hyperlipidemia    Hypertension    Sleep apnea    Thrombocytopenia    Type 2 diabetes mellitus (HCC)    Vitamin B12 deficiency       Objective:    Wt Readings from Last 3 Encounters:  03/09/24 206 lb 3.2 oz (93.5 kg)  01/27/24 202 lb 12.8 oz (92 kg)  12/15/23 210 lb (95.3 kg)    Vital signs reviewed  03/09/2024  - Note at rest 02 sats  93% % on RA   General appearance:    amb wm/slt congested sounding cough   HEENT :  Oropharynx  clear      NECK :  without JVD/Nodes/TM/ nl carotid upstrokes bilaterally   LUNGS: no acc muscle use,  Mod barrel  contour chest wall with bilateral  Distant bs s audible wheeze and  without cough on insp or exp maneuvers and mod  Hyperresonant  to  percussion bilaterally     CV:  RRR  no s3 or murmur or increase in P2, and no edema   ABD:  quite obese/ soft and nontender with pos mid insp Hoover's  in the supine position. No bruits or organomegaly appreciated, bowel sounds nl  MS:   Ext warm without deformities or   obvious joint restrictions , calf tenderness, cyanosis or clubbing  SKIN: warm and dry without lesions    NEURO:  alert, approp, nl sensorium with  no motor or cerebellar deficits apparent.               Assessment    Assessment & Plan COPD GOLD 3 Active smoker with overt GERD  -   After extensive coaching inhaler device,  effectiveness =    50% > try breztri plus max gerd rx and f/u with pfts  - 01/27/2024   Walked on  RA  x  3  lap(s) =  approx 750  ft  @ mod pace, stopped due to end of study s sob  with lowest 02 sats 94% but up to 97% at the end before stopping   - PFT's  02/02/24   FEV1 1.28 (39 % ) ratio 0.61  p 0 %  improvement from saba p 0 prior to study with DLCO  15.8 (63%)   and FV curve midly concave  - LDSCT  02/10/24 . Mild centrilobular emphysema.    - Alpha one Phenotype 03/09/2024 >>>  - 03/09/2024  After extensive coaching inhaler device,  effectiveness =    80%  hfa so continue breztri 2bid  and f/u q 6 m, sooner prn         Each maintenance medication was reviewed in detail including emphasizing most importantly the difference between maintenance and prns and under what circumstances the prns are to be triggered using an action plan format where appropriate.  Total time for H and P, chart review, counseling, reviewing hfda device(s) and generating customized AVS unique to this office visit / same day charting = 25 min         Cigarette smoker 5   min discussion re active cigarette smoking in addition to office E&M  Ask about tobacco use:   ongoing  Advise quitting   I took an extended  opportunity with this patient to outline the consequences of continued cigarette use  in airway disorders based on all the data we have from the multiple national lung health studies (perfomed over decades at millions of dollars in cost)  indicating that smoking cessation, not choice of inhalers or pulmonary physicians, is the most important aspect of his  care, especially as regards the cough  Assess willingness:  Not committed at this point Assist in quit attempt:  Per PCP when ready Arrange follow up:   Follow up per Primary Care planned         AVS  Patient Instructions  Keep working on the stopping smoking - it's the most important aspect of your care   Call if want to try the Ohtuvayre  nebulizer - it's the only other medication we could add but not sure how expensive it will be.    Please  schedule a follow up visit in  6 months but call sooner if needed    Ozell America, MD 03/09/2024

## 2024-03-09 NOTE — Assessment & Plan Note (Addendum)
 Active smoker with overt GERD  -   After extensive coaching inhaler device,  effectiveness =    50% > try breztri plus max gerd rx and f/u with pfts  - 01/27/2024   Walked on RA  x  3  lap(s) =  approx 750  ft  @ mod pace, stopped due to end of study s sob  with lowest 02 sats 94% but up to 97% at the end before stopping   - PFT's  02/02/24   FEV1 1.28 (39 % ) ratio 0.61  p 0 % improvement from saba p 0 prior to study with DLCO  15.8 (63%)   and FV curve midly concave  - LDSCT  02/10/24 . Mild centrilobular emphysema.    - Alpha one Phenotype 03/09/2024 >>>  - 03/09/2024  After extensive coaching inhaler device,  effectiveness =    80%  hfa so continue breztri 2bid  and f/u q 6 m, sooner prn         Each maintenance medication was reviewed in detail including emphasizing most importantly the difference between maintenance and prns and under what circumstances the prns are to be triggered using an action plan format where appropriate.  Total time for H and P, chart review, counseling, reviewing hfda device(s) and generating customized AVS unique to this office visit / same day charting = 25 min

## 2024-03-15 ENCOUNTER — Ambulatory Visit: Payer: Self-pay | Admitting: Internal Medicine

## 2024-03-15 LAB — ALPHA-1-ANTITRYPSIN PHENOTYP: A-1 Antitrypsin: 161 mg/dL (ref 101–187)

## 2024-03-17 NOTE — Progress Notes (Signed)
 Called and relayed results to pt, pt confirmed understanding

## 2024-06-03 ENCOUNTER — Ambulatory Visit: Admitting: Cardiology

## 2024-06-07 ENCOUNTER — Ambulatory Visit: Admitting: Cardiology
